# Patient Record
Sex: Female | Born: 1935 | Race: Black or African American | Hispanic: No | Smoking: Never smoker
Health system: Southern US, Community
[De-identification: ages and names within clinical notes are randomized; demographics above are authoritative.]

## PROBLEM LIST (undated history)

## (undated) DIAGNOSIS — J069 Acute upper respiratory infection, unspecified: Secondary | ICD-10-CM

## (undated) DIAGNOSIS — M25569 Pain in unspecified knee: Secondary | ICD-10-CM

## (undated) DIAGNOSIS — M810 Age-related osteoporosis without current pathological fracture: Secondary | ICD-10-CM

## (undated) DIAGNOSIS — Z86010 Personal history of colonic polyps: Secondary | ICD-10-CM

## (undated) DIAGNOSIS — M549 Dorsalgia, unspecified: Secondary | ICD-10-CM

## (undated) DIAGNOSIS — I10 Essential (primary) hypertension: Secondary | ICD-10-CM

## (undated) DIAGNOSIS — M5412 Radiculopathy, cervical region: Secondary | ICD-10-CM

## (undated) DIAGNOSIS — IMO0002 Reserved for concepts with insufficient information to code with codable children: Secondary | ICD-10-CM

## (undated) DIAGNOSIS — E785 Hyperlipidemia, unspecified: Secondary | ICD-10-CM

## (undated) DIAGNOSIS — Z87442 Personal history of urinary calculi: Secondary | ICD-10-CM

## (undated) DIAGNOSIS — M1990 Unspecified osteoarthritis, unspecified site: Secondary | ICD-10-CM

## (undated) DIAGNOSIS — M4800 Spinal stenosis, site unspecified: Secondary | ICD-10-CM

## (undated) DIAGNOSIS — M899 Disorder of bone, unspecified: Secondary | ICD-10-CM

## (undated) DIAGNOSIS — I639 Cerebral infarction, unspecified: Secondary | ICD-10-CM

## (undated) DIAGNOSIS — H6120 Impacted cerumen, unspecified ear: Secondary | ICD-10-CM

## (undated) DIAGNOSIS — F0390 Unspecified dementia without behavioral disturbance: Secondary | ICD-10-CM

## (undated) DIAGNOSIS — M1710 Unilateral primary osteoarthritis, unspecified knee: Secondary | ICD-10-CM

## (undated) DIAGNOSIS — M7989 Other specified soft tissue disorders: Secondary | ICD-10-CM

## (undated) DIAGNOSIS — E7800 Pure hypercholesterolemia, unspecified: Secondary | ICD-10-CM

## (undated) DIAGNOSIS — M79609 Pain in unspecified limb: Secondary | ICD-10-CM

## (undated) DIAGNOSIS — C4920 Malignant neoplasm of connective and soft tissue of unspecified lower limb, including hip: Secondary | ICD-10-CM

## (undated) DIAGNOSIS — F329 Major depressive disorder, single episode, unspecified: Secondary | ICD-10-CM

## (undated) DIAGNOSIS — M25579 Pain in unspecified ankle and joints of unspecified foot: Secondary | ICD-10-CM

## (undated) DIAGNOSIS — D509 Iron deficiency anemia, unspecified: Secondary | ICD-10-CM

## (undated) DIAGNOSIS — L259 Unspecified contact dermatitis, unspecified cause: Secondary | ICD-10-CM

## (undated) DIAGNOSIS — M949 Disorder of cartilage, unspecified: Secondary | ICD-10-CM

## (undated) DIAGNOSIS — K5909 Other constipation: Secondary | ICD-10-CM

## (undated) DIAGNOSIS — G43009 Migraine without aura, not intractable, without status migrainosus: Secondary | ICD-10-CM

## (undated) HISTORY — DX: Other specified soft tissue disorders: M79.89

## (undated) HISTORY — DX: Pain in unspecified knee: M25.569

## (undated) HISTORY — PX: ABDOMINAL HYSTERECTOMY: SHX81

## (undated) HISTORY — DX: Dorsalgia, unspecified: M54.9

## (undated) HISTORY — DX: Other constipation: K59.09

## (undated) HISTORY — DX: Essential (primary) hypertension: I10

## (undated) HISTORY — DX: Disorder of bone, unspecified: M89.9

## (undated) HISTORY — DX: Major depressive disorder, single episode, unspecified: F32.9

## (undated) HISTORY — DX: Spinal stenosis, site unspecified: M48.00

## (undated) HISTORY — DX: Cerebral infarction, unspecified: I63.9

## (undated) HISTORY — DX: Unspecified contact dermatitis, unspecified cause: L25.9

## (undated) HISTORY — PX: CHOLECYSTECTOMY: SHX55

## (undated) HISTORY — DX: Impacted cerumen, unspecified ear: H61.20

## (undated) HISTORY — DX: Pure hypercholesterolemia, unspecified: E78.00

## (undated) HISTORY — DX: Personal history of colonic polyps: Z86.010

## (undated) HISTORY — DX: Migraine without aura, not intractable, without status migrainosus: G43.009

## (undated) HISTORY — DX: Acute upper respiratory infection, unspecified: J06.9

## (undated) HISTORY — PX: TUBAL LIGATION: SHX77

## (undated) HISTORY — DX: Disorder of cartilage, unspecified: M94.9

## (undated) HISTORY — DX: Age-related osteoporosis without current pathological fracture: M81.0

## (undated) HISTORY — DX: Malignant neoplasm of connective and soft tissue of unspecified lower limb, including hip: C49.20

## (undated) HISTORY — DX: Hyperlipidemia, unspecified: E78.5

## (undated) HISTORY — DX: Pain in unspecified ankle and joints of unspecified foot: M25.579

## (undated) HISTORY — DX: Reserved for concepts with insufficient information to code with codable children: IMO0002

## (undated) HISTORY — DX: Radiculopathy, cervical region: M54.12

## (undated) HISTORY — DX: Iron deficiency anemia, unspecified: D50.9

## (undated) HISTORY — DX: Unilateral primary osteoarthritis, unspecified knee: M17.10

## (undated) HISTORY — DX: Unspecified osteoarthritis, unspecified site: M19.90

## (undated) HISTORY — DX: Pain in unspecified limb: M79.609

## (undated) HISTORY — DX: Personal history of urinary calculi: Z87.442

---

## 1999-04-26 ENCOUNTER — Encounter: Admission: RE | Admit: 1999-04-26 | Discharge: 1999-04-26 | Payer: Self-pay | Admitting: Internal Medicine

## 1999-04-26 ENCOUNTER — Encounter: Payer: Self-pay | Admitting: Internal Medicine

## 2000-05-30 ENCOUNTER — Encounter: Payer: Self-pay | Admitting: Internal Medicine

## 2000-05-30 ENCOUNTER — Encounter: Admission: RE | Admit: 2000-05-30 | Discharge: 2000-05-30 | Payer: Self-pay | Admitting: Internal Medicine

## 2001-09-25 LAB — HM COLONOSCOPY

## 2003-06-17 ENCOUNTER — Ambulatory Visit (HOSPITAL_COMMUNITY): Admission: RE | Admit: 2003-06-17 | Discharge: 2003-06-17 | Payer: Self-pay | Admitting: Obstetrics

## 2004-03-30 ENCOUNTER — Ambulatory Visit: Payer: Self-pay | Admitting: Internal Medicine

## 2004-04-12 ENCOUNTER — Ambulatory Visit: Payer: Self-pay | Admitting: Internal Medicine

## 2004-04-13 ENCOUNTER — Ambulatory Visit: Payer: Self-pay | Admitting: Gastroenterology

## 2004-05-19 ENCOUNTER — Ambulatory Visit: Payer: Self-pay | Admitting: Gastroenterology

## 2005-01-05 ENCOUNTER — Ambulatory Visit: Payer: Self-pay | Admitting: Internal Medicine

## 2005-01-25 ENCOUNTER — Ambulatory Visit: Payer: Self-pay | Admitting: Internal Medicine

## 2005-03-31 ENCOUNTER — Ambulatory Visit: Payer: Self-pay | Admitting: Internal Medicine

## 2005-06-30 ENCOUNTER — Ambulatory Visit (HOSPITAL_COMMUNITY): Admission: RE | Admit: 2005-06-30 | Discharge: 2005-06-30 | Payer: Self-pay | Admitting: Internal Medicine

## 2005-08-04 ENCOUNTER — Ambulatory Visit: Payer: Self-pay | Admitting: Internal Medicine

## 2006-01-20 ENCOUNTER — Ambulatory Visit: Payer: Self-pay | Admitting: Internal Medicine

## 2006-02-20 ENCOUNTER — Ambulatory Visit: Payer: Self-pay | Admitting: Internal Medicine

## 2006-03-06 ENCOUNTER — Ambulatory Visit: Payer: Self-pay | Admitting: Internal Medicine

## 2006-03-06 LAB — CONVERTED CEMR LAB
ALT: 22 units/L (ref 0–40)
Cholesterol: 231 mg/dL (ref 0–200)
GFR calc non Af Amer: 57 mL/min
Glucose, Bld: 88 mg/dL (ref 70–99)
HDL: 86.1 mg/dL (ref >39.0)
Potassium: 4.5 meq/L (ref 3.5–5.1)
Sodium: 144 meq/L (ref 135–145)
TSH: 1.9 microintl units/mL (ref 0.35–5.50)
Total CHOL/HDL Ratio: 2.7
Triglycerides: 74 mg/dL (ref 0–149)
VLDL: 15 mg/dL (ref 0–40)

## 2006-04-10 ENCOUNTER — Ambulatory Visit: Payer: Self-pay | Admitting: Internal Medicine

## 2006-04-29 ENCOUNTER — Inpatient Hospital Stay (HOSPITAL_COMMUNITY): Admission: AD | Admit: 2006-04-29 | Discharge: 2006-05-02 | Payer: Self-pay | Admitting: Internal Medicine

## 2006-04-29 ENCOUNTER — Ambulatory Visit: Payer: Self-pay | Admitting: Family Medicine

## 2006-04-30 ENCOUNTER — Ambulatory Visit: Payer: Self-pay | Admitting: Internal Medicine

## 2006-05-01 ENCOUNTER — Ambulatory Visit: Payer: Self-pay | Admitting: Physical Medicine & Rehabilitation

## 2006-05-08 ENCOUNTER — Ambulatory Visit: Payer: Self-pay | Admitting: Internal Medicine

## 2006-05-16 ENCOUNTER — Ambulatory Visit: Payer: Self-pay | Admitting: Internal Medicine

## 2006-05-22 ENCOUNTER — Ambulatory Visit: Payer: Self-pay | Admitting: Internal Medicine

## 2006-05-29 ENCOUNTER — Ambulatory Visit: Payer: Self-pay | Admitting: Internal Medicine

## 2006-05-29 LAB — CONVERTED CEMR LAB
ALT: 19 units/L (ref 0–40)
Alkaline Phosphatase: 63 units/L (ref 39–117)
BUN: 11 mg/dL (ref 6–23)
Bilirubin, Direct: 0.2 mg/dL (ref 0.0–0.3)
CO2: 29 meq/L (ref 19–32)
Calcium: 9.6 mg/dL (ref 8.4–10.5)
GFR calc Af Amer: 69 mL/min
GFR calc non Af Amer: 57 mL/min
Glucose, Bld: 83 mg/dL (ref 70–99)
LDL Cholesterol: 81 mg/dL (ref 0–99)
Potassium: 3.6 meq/L (ref 3.5–5.1)
Total CHOL/HDL Ratio: 2
Total Protein: 6.8 g/dL (ref 6.0–8.3)
Triglycerides: 51 mg/dL (ref 0–149)

## 2006-06-16 ENCOUNTER — Ambulatory Visit: Payer: Self-pay | Admitting: Internal Medicine

## 2006-06-21 ENCOUNTER — Ambulatory Visit: Payer: Self-pay | Admitting: Internal Medicine

## 2006-07-11 ENCOUNTER — Ambulatory Visit: Payer: Self-pay | Admitting: Internal Medicine

## 2006-09-21 ENCOUNTER — Encounter: Payer: Self-pay | Admitting: *Deleted

## 2006-09-21 DIAGNOSIS — K5909 Other constipation: Secondary | ICD-10-CM

## 2006-09-21 DIAGNOSIS — Z860100 Personal history of colon polyps, unspecified: Secondary | ICD-10-CM

## 2006-09-21 DIAGNOSIS — I10 Essential (primary) hypertension: Secondary | ICD-10-CM

## 2006-09-21 DIAGNOSIS — Z87442 Personal history of urinary calculi: Secondary | ICD-10-CM

## 2006-09-21 DIAGNOSIS — G43009 Migraine without aura, not intractable, without status migrainosus: Secondary | ICD-10-CM | POA: Insufficient documentation

## 2006-09-21 HISTORY — DX: Migraine without aura, not intractable, without status migrainosus: G43.009

## 2006-09-21 HISTORY — DX: Personal history of colon polyps, unspecified: Z86.0100

## 2006-09-21 HISTORY — DX: Essential (primary) hypertension: I10

## 2006-09-21 HISTORY — DX: Personal history of urinary calculi: Z87.442

## 2006-09-21 HISTORY — DX: Personal history of colonic polyps: Z86.010

## 2006-09-21 HISTORY — DX: Other constipation: K59.09

## 2006-10-05 ENCOUNTER — Ambulatory Visit: Payer: Self-pay | Admitting: Internal Medicine

## 2006-11-20 ENCOUNTER — Telehealth (INDEPENDENT_AMBULATORY_CARE_PROVIDER_SITE_OTHER): Payer: Self-pay | Admitting: *Deleted

## 2006-12-21 ENCOUNTER — Encounter: Payer: Self-pay | Admitting: Internal Medicine

## 2007-01-01 ENCOUNTER — Encounter (INDEPENDENT_AMBULATORY_CARE_PROVIDER_SITE_OTHER): Payer: Self-pay | Admitting: *Deleted

## 2007-01-01 ENCOUNTER — Ambulatory Visit: Payer: Self-pay | Admitting: Internal Medicine

## 2007-01-01 LAB — CONVERTED CEMR LAB
CO2: 29 meq/L (ref 19–32)
Creatinine, Ser: 1.1 mg/dL (ref 0.4–1.2)
GFR calc Af Amer: 62 mL/min
Glucose, Bld: 85 mg/dL (ref 70–99)
HDL: 94.3 mg/dL (ref >39.0)
Potassium: 3.9 meq/L (ref 3.5–5.1)
Sodium: 141 meq/L (ref 135–145)
Total CHOL/HDL Ratio: 1.8
Triglycerides: 42 mg/dL (ref 0–149)

## 2007-01-17 ENCOUNTER — Encounter: Payer: Self-pay | Admitting: Internal Medicine

## 2007-01-22 ENCOUNTER — Ambulatory Visit: Payer: Self-pay | Admitting: Internal Medicine

## 2007-01-22 DIAGNOSIS — M25579 Pain in unspecified ankle and joints of unspecified foot: Secondary | ICD-10-CM

## 2007-01-22 DIAGNOSIS — M810 Age-related osteoporosis without current pathological fracture: Secondary | ICD-10-CM | POA: Insufficient documentation

## 2007-01-22 HISTORY — DX: Age-related osteoporosis without current pathological fracture: M81.0

## 2007-01-22 HISTORY — DX: Pain in unspecified ankle and joints of unspecified foot: M25.579

## 2007-04-20 ENCOUNTER — Ambulatory Visit: Payer: Self-pay | Admitting: Internal Medicine

## 2007-04-20 DIAGNOSIS — J069 Acute upper respiratory infection, unspecified: Secondary | ICD-10-CM | POA: Insufficient documentation

## 2007-04-20 DIAGNOSIS — H6120 Impacted cerumen, unspecified ear: Secondary | ICD-10-CM

## 2007-04-20 HISTORY — DX: Acute upper respiratory infection, unspecified: J06.9

## 2007-04-20 HISTORY — DX: Impacted cerumen, unspecified ear: H61.20

## 2007-05-21 ENCOUNTER — Encounter: Payer: Self-pay | Admitting: Internal Medicine

## 2007-07-03 ENCOUNTER — Encounter: Payer: Self-pay | Admitting: Internal Medicine

## 2007-07-16 ENCOUNTER — Ambulatory Visit: Payer: Self-pay | Admitting: Internal Medicine

## 2007-07-16 LAB — CONVERTED CEMR LAB
AST: 26 units/L (ref 0–37)
Albumin: 3.8 g/dL (ref 3.5–5.2)
BUN: 15 mg/dL (ref 6–23)
Calcium: 9.6 mg/dL (ref 8.4–10.5)
Chloride: 107 meq/L (ref 96–112)
Cholesterol: 191 mg/dL (ref 0–200)
Creatinine, Ser: 1 mg/dL (ref 0.4–1.2)
GFR calc Af Amer: 69 mL/min
GFR calc non Af Amer: 57 mL/min
HDL: 90.4 mg/dL (ref >39.0)
LDL Cholesterol: 92 mg/dL (ref 0–99)
Total Bilirubin: 0.7 mg/dL (ref 0.3–1.2)
Total CHOL/HDL Ratio: 2.1
Triglycerides: 44 mg/dL (ref 0–149)
VLDL: 9 mg/dL (ref 0–40)

## 2007-07-22 ENCOUNTER — Encounter: Payer: Self-pay | Admitting: Internal Medicine

## 2007-08-01 ENCOUNTER — Ambulatory Visit: Payer: Self-pay | Admitting: Internal Medicine

## 2007-08-01 DIAGNOSIS — E785 Hyperlipidemia, unspecified: Secondary | ICD-10-CM

## 2007-08-01 DIAGNOSIS — D509 Iron deficiency anemia, unspecified: Secondary | ICD-10-CM

## 2007-08-01 DIAGNOSIS — F411 Generalized anxiety disorder: Secondary | ICD-10-CM | POA: Insufficient documentation

## 2007-08-01 DIAGNOSIS — F329 Major depressive disorder, single episode, unspecified: Secondary | ICD-10-CM

## 2007-08-01 DIAGNOSIS — F3289 Other specified depressive episodes: Secondary | ICD-10-CM

## 2007-08-01 HISTORY — DX: Iron deficiency anemia, unspecified: D50.9

## 2007-08-01 HISTORY — DX: Hyperlipidemia, unspecified: E78.5

## 2007-08-01 HISTORY — DX: Other specified depressive episodes: F32.89

## 2007-08-01 HISTORY — DX: Major depressive disorder, single episode, unspecified: F32.9

## 2007-09-04 ENCOUNTER — Telehealth: Payer: Self-pay | Admitting: Internal Medicine

## 2007-09-19 ENCOUNTER — Ambulatory Visit (HOSPITAL_COMMUNITY): Admission: RE | Admit: 2007-09-19 | Discharge: 2007-09-19 | Payer: Self-pay | Admitting: Internal Medicine

## 2007-09-27 ENCOUNTER — Encounter: Payer: Self-pay | Admitting: Internal Medicine

## 2007-10-04 ENCOUNTER — Encounter: Admission: RE | Admit: 2007-10-04 | Discharge: 2007-10-04 | Payer: Self-pay | Admitting: Internal Medicine

## 2008-03-03 ENCOUNTER — Ambulatory Visit: Payer: Self-pay | Admitting: Internal Medicine

## 2008-03-03 DIAGNOSIS — M79609 Pain in unspecified limb: Secondary | ICD-10-CM | POA: Insufficient documentation

## 2008-03-03 HISTORY — DX: Pain in unspecified limb: M79.609

## 2008-03-11 ENCOUNTER — Encounter: Payer: Self-pay | Admitting: Internal Medicine

## 2008-03-11 ENCOUNTER — Ambulatory Visit: Payer: Self-pay

## 2008-07-28 ENCOUNTER — Ambulatory Visit: Payer: Self-pay | Admitting: Internal Medicine

## 2008-08-01 ENCOUNTER — Ambulatory Visit: Payer: Self-pay | Admitting: Internal Medicine

## 2008-08-01 DIAGNOSIS — L259 Unspecified contact dermatitis, unspecified cause: Secondary | ICD-10-CM

## 2008-08-01 HISTORY — DX: Unspecified contact dermatitis, unspecified cause: L25.9

## 2008-08-08 ENCOUNTER — Ambulatory Visit: Payer: Self-pay | Admitting: Internal Medicine

## 2008-08-08 LAB — CONVERTED CEMR LAB
Basophils Absolute: 0 10*3/uL (ref 0.0–0.1)
CO2: 31 meq/L (ref 19–32)
Calcium: 9.4 mg/dL (ref 8.4–10.5)
HCT: 34.6 % — ABNORMAL LOW (ref 36.0–46.0)
HDL: 96 mg/dL (ref >39.00)
Leukocytes, UA: NEGATIVE
Lymphs Abs: 2.9 10*3/uL (ref 0.7–4.0)
MCV: 93.2 fL (ref 78.0–100.0)
Monocytes Absolute: 0.6 10*3/uL (ref 0.1–1.0)
Monocytes Relative: 7.1 % (ref 3.0–12.0)
Neutrophils Relative %: 55 % (ref 43.0–77.0)
Nitrite: NEGATIVE
Platelets: 196 10*3/uL (ref 150.0–400.0)
Potassium: 4.1 meq/L (ref 3.5–5.1)
RDW: 13.2 % (ref 11.5–14.6)
Sodium: 143 meq/L (ref 135–145)
Specific Gravity, Urine: 1.025 (ref 1.000–1.030)
TSH: 2.88 microintl units/mL (ref 0.35–5.50)
Total Bilirubin: 0.6 mg/dL (ref 0.3–1.2)
Total CHOL/HDL Ratio: 2
Triglycerides: 42 mg/dL (ref 0.0–149.0)
VLDL: 8.4 mg/dL (ref 0.0–40.0)
pH: 5 (ref 5.0–8.0)

## 2008-08-09 LAB — CONVERTED CEMR LAB
Iron: 61 ug/dL (ref 42–145)
Transferrin: 248.6 mg/dL (ref 212.0–360.0)
Vitamin B-12: 324 pg/mL (ref 211–911)

## 2008-09-15 ENCOUNTER — Ambulatory Visit: Payer: Self-pay | Admitting: Internal Medicine

## 2008-09-15 DIAGNOSIS — M25569 Pain in unspecified knee: Secondary | ICD-10-CM

## 2008-09-15 HISTORY — DX: Pain in unspecified knee: M25.569

## 2008-12-17 ENCOUNTER — Ambulatory Visit: Payer: Self-pay | Admitting: Internal Medicine

## 2008-12-17 DIAGNOSIS — M949 Disorder of cartilage, unspecified: Secondary | ICD-10-CM

## 2008-12-17 DIAGNOSIS — M4800 Spinal stenosis, site unspecified: Secondary | ICD-10-CM

## 2008-12-17 DIAGNOSIS — M899 Disorder of bone, unspecified: Secondary | ICD-10-CM | POA: Insufficient documentation

## 2008-12-17 HISTORY — DX: Spinal stenosis, site unspecified: M48.00

## 2008-12-17 HISTORY — DX: Disorder of bone, unspecified: M89.9

## 2009-02-11 ENCOUNTER — Encounter: Payer: Self-pay | Admitting: Internal Medicine

## 2009-02-11 ENCOUNTER — Telehealth: Payer: Self-pay | Admitting: Internal Medicine

## 2009-03-11 ENCOUNTER — Telehealth: Payer: Self-pay | Admitting: Internal Medicine

## 2009-03-26 ENCOUNTER — Ambulatory Visit: Payer: Self-pay | Admitting: Internal Medicine

## 2009-05-12 ENCOUNTER — Telehealth: Payer: Self-pay | Admitting: Internal Medicine

## 2009-07-02 ENCOUNTER — Telehealth: Payer: Self-pay | Admitting: Internal Medicine

## 2009-08-28 ENCOUNTER — Telehealth (INDEPENDENT_AMBULATORY_CARE_PROVIDER_SITE_OTHER): Payer: Self-pay | Admitting: *Deleted

## 2009-09-14 ENCOUNTER — Ambulatory Visit: Payer: Self-pay | Admitting: Internal Medicine

## 2009-09-14 LAB — CONVERTED CEMR LAB
ALT: 13 units/L (ref 0–35)
BUN: 14 mg/dL (ref 6–23)
Bilirubin Urine: NEGATIVE
Bilirubin, Direct: 0.1 mg/dL (ref 0.0–0.3)
Chloride: 106 meq/L (ref 96–112)
Creatinine, Ser: 1.1 mg/dL (ref 0.4–1.2)
Eosinophils Relative: 1.4 % (ref 0.0–5.0)
GFR calc non Af Amer: 64.55 mL/min (ref >60)
HDL: 88.7 mg/dL (ref >39.00)
Hemoglobin, Urine: NEGATIVE
Ketones, ur: NEGATIVE mg/dL
LDL Cholesterol: 86 mg/dL (ref 0–99)
MCV: 92.3 fL (ref 78.0–100.0)
Monocytes Absolute: 0.3 10*3/uL (ref 0.1–1.0)
Neutrophils Relative %: 56 % (ref 43.0–77.0)
Platelets: 209 10*3/uL (ref 150.0–400.0)
Total Bilirubin: 0.5 mg/dL (ref 0.3–1.2)
Total CHOL/HDL Ratio: 2
Triglycerides: 51 mg/dL (ref 0.0–149.0)
Urine Glucose: NEGATIVE mg/dL
Urobilinogen, UA: 0.2 (ref 0.0–1.0)
VLDL: 10.2 mg/dL (ref 0.0–40.0)
WBC: 4 10*3/uL — ABNORMAL LOW (ref 4.5–10.5)

## 2009-10-08 ENCOUNTER — Ambulatory Visit (HOSPITAL_COMMUNITY): Admission: RE | Admit: 2009-10-08 | Discharge: 2009-10-08 | Payer: Self-pay | Admitting: Internal Medicine

## 2009-10-08 LAB — HM MAMMOGRAPHY: HM Mammogram: NEGATIVE

## 2010-02-07 ENCOUNTER — Encounter: Payer: Self-pay | Admitting: Internal Medicine

## 2010-02-08 ENCOUNTER — Encounter: Payer: Self-pay | Admitting: Internal Medicine

## 2010-02-10 ENCOUNTER — Ambulatory Visit
Admission: RE | Admit: 2010-02-10 | Discharge: 2010-02-10 | Payer: Self-pay | Source: Home / Self Care | Attending: Internal Medicine | Admitting: Internal Medicine

## 2010-02-10 DIAGNOSIS — M549 Dorsalgia, unspecified: Secondary | ICD-10-CM | POA: Insufficient documentation

## 2010-02-10 HISTORY — DX: Dorsalgia, unspecified: M54.9

## 2010-02-18 NOTE — Progress Notes (Signed)
Summary: [facility]  Phone Note Call from Patient Call back at Home Phone [phone]   Caller: Patient Summary of Call: pt called stating that her new Insurance co will no longer cover her RX for Benicar. pt is requesting alt to go to [facility] along with all of her other medications. Initial call taken by: Margaret Pyle, CMA,  March 11, 2009 8:44 AM  Follow-up for Phone Call        ok to change to diovan 320 mg -   to robin to handle change and other rx Follow-up by: [name] MD,  March 11, 2009 12:45 PM    New/Updated Medications: DIOVAN 320 MG TABS (VALSARTAN) 1 by mouth once daily Prescriptions: DIOVAN 320 MG TABS (VALSARTAN) 1 by mouth once daily  #90 x 1   Entered by:   Scharlene Gloss   Authorized by:   [name] MD   Signed by:   Scharlene Gloss on 03/11/2009   Method used:   Faxed to ...       [facility] (mail-order)             ,          Ph: [phone]       Fax: [phone]   RxID:   [id number] BENICAR 40 MG  TABS (OLMESARTAN MEDOXOMIL) Take 1 tablet by mouth once a day  #90 x 1   Entered by:   Scharlene Gloss   Authorized by:   [name] MD   Signed by:   Scharlene Gloss on 03/11/2009   Method used:   Faxed to ...       [facility]* (mail-order)             ,          Ph: [phone]       Fax: [phone]   RxID:   [id number] DIOVAN 320 MG TABS (VALSARTAN) 1 by mouth once daily  #90 x 1   Entered by:   Scharlene Gloss   Authorized by:   [name] MD   Signed by:   Scharlene Gloss on 03/11/2009   Method used:   Faxed to ...       [facility]* (mail-order)             ,          Ph: [phone]       Fax: [phone]   RxID:   [id number]

## 2010-02-18 NOTE — Assessment & Plan Note (Signed)
Summary: FU---STC   Vital Signs:  Patient profile:   76 year old female Height:      64 inches Weight:      130.50 pounds BMI:     22.48 O2 Sat:      96 % on Room air Temp:     97.9 degrees F oral Pulse rate:   74 / minute BP sitting:   120 / 68  (left arm) Cuff size:   regular  Vitals Entered By: Zella Ball Ewing CMA Duncan Dull) (February 10, 2010 9:49 AM)  O2 Flow:  Room air CC: followup/RE   CC:  followup/RE.  History of Present Illness: here for f/u;  overall doing ok, but with ongoing mild LBP with some radiation to the right buttock area, intermittent, nothing makes worse or better but prefers to cont to try tylenol as needed,  without worseing bowel or bladder change, more distal LE pain/weak/numb, gait change or fall.  Pt denies CP, worsening sob, doe, wheezing, orthopnea, pnd, worsening LE edema, palps, dizziness or syncope  Pt denies new neuro symptoms such as headache, facial or extremity weakness  Pt denies polydipsia, polyuria  Overall good compliance with meds, trying to follow low chol diet, wt stable, little excercise however No fever, wt loss, night sweats, loss of appetite or other constitutional symptoms  No recent trauma.  Denies worsening depressive symptoms, suicidal ideation, or panic.  , though has some midl ongoing anxeity.  Overall good compliance with meds, and good tolerability.   Problems Prior to Update: 1)  Back Pain, Chronic, Intermittent  (ICD-724.5) 2)  Osteopenia  (ICD-733.90) 3)  Spinal Stenosis  (ICD-724.00) 4)  Knee Pain, Bilateral  (ICD-719.46) 5)  Contact Dermatitis  (ICD-692.9) 6)  Leg Pain, Right  (ICD-729.5) 7)  Anemia-iron Deficiency  (ICD-280.9) 8)  Depression  (ICD-311) 9)  Hyperlipidemia  (ICD-272.4) 10)  Anxiety  (ICD-300.00) 11)  Preventive Health Care  (ICD-V70.0) 12)  Cerumen Impaction, Right  (ICD-380.4) 13)  Uri  (ICD-465.9) 14)  Osteoporosis  (ICD-733.00) 15)  Ankle Pain, Left  (ICD-719.47) 16)  Constipation, Chronic   (ICD-564.09) 17)  Nephrolithiasis, Hx of  (ICD-V13.01) 18)  Common Migraine  (ICD-346.10) 19)  Colonic Polyps, Hx of  (ICD-V12.72) 20)  Hypertension  (ICD-401.9)  Medications Prior to Update: 1)  Boniva 150 Mg  Tabs (Ibandronate Sodium) .... One By Mouth Once A Month 2)  Aspirin 325 Mg  Tabs (Aspirin) .... Take 1 Tablet By Mouth Once A Day 3)  Amlodipine Besylate 10 Mg  Tabs (Amlodipine Besylate) .... Take 1 Tablet By Mouth Once A Day 4)  Calcium-Vitamin D 500-125 Mg-Unit  Tabs (Calcium-Vitamin D) .... Take 1 Tablet By Mouth Once A Day 5)  Miralax   Powd (Polyethylene Glycol 3350) .Marland Kitchen.. 17grams Two Times A Day 6)  Simvastatin 20 Mg  Tabs (Simvastatin) .... Take 1 Tablet By Mouth Once A Day 7)  Diovan 320 Mg Tabs (Valsartan) .Marland Kitchen.. 1 By Mouth Once Daily  Current Medications (verified): 1)  Boniva 150 Mg  Tabs (Ibandronate Sodium) .... One By Mouth Once A Month 2)  Aspirin 325 Mg  Tabs (Aspirin) .... Take 1 Tablet By Mouth Once A Day 3)  Amlodipine Besylate 10 Mg  Tabs (Amlodipine Besylate) .... Take 1 Tablet By Mouth Once A Day 4)  Calcium-Vitamin D 500-125 Mg-Unit  Tabs (Calcium-Vitamin D) .... Take 1 Tablet By Mouth Once A Day 5)  Miralax   Powd (Polyethylene Glycol 3350) .Marland Kitchen.. 17grams Two Times A Day 6)  Simvastatin  20 Mg  Tabs (Simvastatin) .... Take 1 Tablet By Mouth Once A Day 7)  Diovan 320 Mg Tabs (Valsartan) .Marland Kitchen.. 1 By Mouth Once Daily 8)  Zovirax 5 % Crea (Acyclovir) .... Use Asd 5 Times Per Day As Needed  Allergies (verified): 1)  ! Fosamax 2)  ! Maxzide  Past History:  Past Medical History: Last updated: 12/17/2008 Hypertension Colonic polyps, hx of - adenomatous Osteoporosis Cerebrovascular accident with right-leg weakness. 04/2006 Spinal stenosis Anxiety Hyperlipidemia chronic constipation Nephrolithiasis, hx of Depression Anemia-iron deficiency Osteopenia  Past Surgical History: Last updated: 08/01/2007 Cholecystectomy Hysterectomy Tubal ligation  Social  History: Last updated: 09/14/2009 Widowed Duaghter lives with her, has mild handcap- chronic mental Retired from housekeeping and cooking Never Smoked Alcohol use-no Drug use-no  Risk Factors: Smoking Status: never (04/20/2007)  Review of Systems       all otherwise negative per pt -    Physical Exam  General:  alert and underweight appearing.   Head:  normocephalic and atraumatic.   Eyes:  vision grossly intact, pupils equal, and pupils round.   Ears:  R ear normal and L ear normal.   Nose:  no external deformity and no nasal discharge.   Mouth:  no gingival abnormalities and pharynx pink and moist.   Neck:  supple and no masses.   Lungs:  normal respiratory effort and normal breath sounds.   Heart:  normal rate and regular rhythm.   Abdomen:  soft, non-tender, and normal bowel sounds.   Msk:  no joint tenderness and no joint swelling.  , no spinal tender or paravertebral tender Extremities:  no edema, no erythema  Neurologic:  cranial nerves II-XII intact and strength normal in all extremities.   Skin:  color normal and no rashes.   Psych:  not depressed appearing and slightly anxious.     Impression & Recommendations:  Problem # 1:  BACK PAIN, CHRONIC, INTERMITTENT (ICD-724.5)  Her updated medication list for this problem includes:    Aspirin 325 Mg Tabs (Aspirin) .Marland Kitchen... Take 1 tablet by mouth once a day prob spinal stenosis flare with pain radiating towards the right knee, pt de lcines pain med such as tramadol or ortho at this time, did do better with ESI in the past  Problem # 2:  HYPERTENSION (ICD-401.9)  Her updated medication list for this problem includes:    Amlodipine Besylate 10 Mg Tabs (Amlodipine besylate) .Marland Kitchen... Take 1 tablet by mouth once a day    Diovan 320 Mg Tabs (Valsartan) .Marland Kitchen... 1 by mouth once daily  BP today: 120/68 Prior BP: 140/70 (09/14/2009)  Labs Reviewed: K+: 4.3 (09/14/2009) Creat: : 1.1 (09/14/2009)   Chol: 185 (09/14/2009)   HDL:  88.70 (09/14/2009)   LDL: 86 (09/14/2009)   TG: 51.0 (09/14/2009) stable overall by hx and exam, ok to continue meds/tx as is   Problem # 3:  DEPRESSION (ICD-311) stable overall by hx and exam, ok to continue meds/tx as is - declines med such as SSRI  Complete Medication List: 1)  Boniva 150 Mg Tabs (Ibandronate sodium) .... One by mouth once a month 2)  Aspirin 325 Mg Tabs (Aspirin) .... Take 1 tablet by mouth once a day 3)  Amlodipine Besylate 10 Mg Tabs (Amlodipine besylate) .... Take 1 tablet by mouth once a day 4)  Calcium-vitamin D 500-125 Mg-unit Tabs (Calcium-vitamin d) .... Take 1 tablet by mouth once a day 5)  Miralax Powd (Polyethylene glycol 3350) .Marland Kitchen.. 17grams two times a day 6)  Simvastatin 20 Mg Tabs (Simvastatin) .... Take 1 tablet by mouth once a day 7)  Diovan 320 Mg Tabs (Valsartan) .Marland Kitchen.. 1 by mouth once daily 8)  Zovirax 5 % Crea (Acyclovir) .... Use asd 5 times per day as needed  Patient Instructions: 1)  Continue all previous medications as before this visit 2)  Please schedule a follow-up appointment in 7 months for CPX with labs Prescriptions: DIOVAN 320 MG TABS (VALSARTAN) 1 by mouth once daily  #90 x 3   Entered and Authorized by:   [name] MD   Signed by:   [name] MD on 02/10/2010   Method used:   Print then Give to Patient   RxID:   [id number] SIMVASTATIN 20 MG  TABS (SIMVASTATIN) Take 1 tablet by mouth once a day  #90 x 3   Entered and Authorized by:   [name] MD   Signed by:   [name] MD on 02/10/2010   Method used:   Print then Give to Patient   RxID:   [id number] AMLODIPINE BESYLATE 10 MG  TABS (AMLODIPINE BESYLATE) Take 1 tablet by mouth once a day  #90 x 3   Entered and Authorized by:   [name] MD   Signed by:   [name] MD on 02/10/2010   Method used:   Print then Give to Patient   RxID:   [id number] BONIVA 150 MG  TABS (IBANDRONATE SODIUM) one by mouth once a month  #3 x 3   Entered and Authorized  by:   [name] MD   Signed by:   [name] MD on 02/10/2010   Method used:   Print then Give to Patient   RxID:   [id number] AMLODIPINE BESYLATE 10 MG  TABS (AMLODIPINE BESYLATE) Take 1 tablet by mouth once a day  #90 x 2   Entered and Authorized by:   [name] MD   Signed by:   [name] MD on 02/10/2010   Method used:   Electronically to        [facility]* (retail)       [address]       [place], [place]  [id number]       Ph: [phone] or [phone]       Fax: [phone]   RxID:   [id number] ZOVIRAX 5 % CREA (ACYCLOVIR) use asd 5 times per day as needed  #1large x 1   Entered and Authorized by:   [name] MD   Signed by:   [name] MD on 02/10/2010   Method used:   Electronically to        [facility]* (retail)       [address]       [place], [place]  [address]       Ph: [phone] or [phone]       Fax: [phone]   RxID:   [id number]    Orders Added: 1)  Est. Patient Level IV [27782]

## 2010-02-18 NOTE — Progress Notes (Signed)
  Phone Note Call from Patient Call back at Home Phone [phone]   Caller: Patient Call For: Dr Jonny Ruiz Summary of Call: Pt sched appt for 8/22 to follow up w/MD. Pt wants labs before appt. She does not know what labs she needs but states she had a piece of paper that states she needs labs. Please advise and let pt know. Initial call taken by: Verdell Face,  August 28, 2009 1:54 PM  Follow-up for Phone Call        Pt was told at last OV to have CPX labs. Follow-up by: Margaret Pyle, CMA,  August 28, 2009 2:42 PM  Additional Follow-up for Phone Call Additional follow up Details #1::        orders in IDX. Additional Follow-up by: Verdell Face,  August 31, 2009 7:59 AM

## 2010-02-18 NOTE — Progress Notes (Signed)
Summary: Rx request  Phone Note Call from Patient Call back at Home Phone [phone]   Caller: Patient Call For: [name] MD Summary of Call: Patient came into the office with paperwork from [facility] stating they are not covering her Benicar anymore. She is either going to need a replacement, or we are going to need to call her insurance company at [phone] to request an exception.She would prefer to stay with this medication if possible. Initial call taken by: Irma Newness,  February 11, 2009 8:12 AM  Follow-up for Phone Call        VO:[id number]. Called [employer] and covered alternatives are Losartan (tier 1), and Micardis (tier 2). Should patient be switched to one of these meds or try to do a PA? Please advise. Follow-up by: Lucious Groves,  February 11, 2009 9:16 AM  Additional Follow-up for Phone Call Additional follow up Details #1::        gave "card" for money off on the benicar to offer patient for money off on the monthly copays   - card is good until 2016 Additional Follow-up by: [name] MD,  February 11, 2009 12:59 PM    Additional Follow-up for Phone Call Additional follow up Details #2::    Patient notified and she would like to pick the card up on Friday. Placed up front. Follow-up by: Lucious Groves,  February 11, 2009 3:08 PM

## 2010-02-18 NOTE — Medication Information (Signed)
Summary: Benicar denied/[employer] [place]  Benicar denied/[employer] [place]   Imported By: Lester [place] 02/13/2009 15:13:54  _____________________________________________________________________  External Attachment:    Type:   Image     Comment:   External Document

## 2010-02-18 NOTE — Progress Notes (Signed)
Summary: REFILLS TO [facility]  Phone Note Call from Patient Call back at Home Phone [phone]   Caller: [facility] Summary of Call: PT NEEDS REFILLS ON BONIVA 150 MG, DIOVAN 320, MIRALAX POWDER POLYETHYLENE GLYCOL 17 GRAMS, AMLODIPINE BESYLATE 10 MG, SIMVASTATIN 20 MG SENT TO [facility]. Initial call taken by: Hilarie Fredrickson,  July 02, 2009 8:59 AM    Prescriptions: DIOVAN 320 MG TABS (VALSARTAN) 1 by mouth once daily  #90 x 2   Entered by:   Margaret Pyle, CMA   Authorized by:   [name] MD   Signed by:   Margaret Pyle, CMA on 07/02/2009   Method used:   Faxed to ...       [facility] (mail-order)             , [place]         Ph: [phone]       Fax: [phone]   RxID:   [id number] SIMVASTATIN 20 MG  TABS (SIMVASTATIN) Take 1 tablet by mouth once a day  #90 x 2   Entered by:   Margaret Pyle, CMA   Authorized by:   [name] MD   Signed by:   Margaret Pyle, CMA on 07/02/2009   Method used:   Faxed to ...       [facility] (mail-order)             , [place]         Ph: [phone]       Fax: [phone]   RxID:   [id number] AMLODIPINE BESYLATE 10 MG  TABS (AMLODIPINE BESYLATE) Take 1 tablet by mouth once a day  #90 x 2   Entered by:   Margaret Pyle, CMA   Authorized by:   [name] MD   Signed by:   Margaret Pyle, CMA on 07/02/2009   Method used:   Faxed to ...       [facility] (mail-order)             , [place]         Ph: [phone]       Fax: [phone]   RxID:   [id number] MIRALAX   POWD (POLYETHYLENE GLYCOL 3350) 17grams two times a day  #33mth x 2   Entered by:   Margaret Pyle, CMA   Authorized by:   [name] MD   Signed by:   Margaret Pyle, CMA on 07/02/2009   Method used:   Faxed to ...       [facility] (mail-order)             , [place]         Ph: [phone]       Fax: [phone]   RxID:   [id number] BONIVA 150 MG  TABS (IBANDRONATE SODIUM) one by mouth once a month  #3 x 2  Entered by:   Margaret Pyle, CMA   Authorized by:   [name] MD   Signed by:   Margaret Pyle, CMA on 07/02/2009   Method used:   Faxed to ...       [facility] (mail-order)             , [place]         Ph: [phone]       Fax: [phone]   RxID:   [id number]

## 2010-02-18 NOTE — Assessment & Plan Note (Signed)
Summary: f/u appt/#/cd   PRESSED 1 TO CX/ LMOM TO CONFIRM/NWS   Vital Signs:  Patient profile:   77 year old female Height:      64 inches Weight:      128 pounds BMI:     22.05 O2 Sat:      97 % on Room air Temp:     97.1 degrees F oral Pulse rate:   83 / minute BP sitting:   140 / 70  (left arm) Cuff size:   regular  Vitals Entered By: Zella Ball Ewing CMA Duncan Dull) (September 14, 2009 8:23 AM)  O2 Flow:  Room air  Preventive Care Screening     decliens flus shot and colonoscopy, adn dxa;  does plan to get the mammogram later next month  CC: followup/RE   CC:  followup/RE.  History of Present Illness: overall doing well;  Pt denies CP, worsening sob, doe, wheezing, orthopnea, pnd, worsening LE edema, palps, dizziness or syncope  Pt denies new neuro symptoms such as headache, facial or extremity weakness  Still with ongoing recurring leg pains and back pain  - stable and Ok iwth current pain meds.  No wroseningLE pain, weakness or numbness. No falls or injury.  No fever, wt loss, night sweats, loss of appetite or other constitutional symptoms  Has had some mild constipatoin recently but this has beena recurring problems in the past, better with prune juice.  Has not driven for 20 yrs.   Preventive Screening-Counseling & Management      Drug Use:  no.    Problems Prior to Update: 1)  Osteopenia  (ICD-733.90) 2)  Spinal Stenosis  (ICD-724.00) 3)  Knee Pain, Bilateral  (ICD-719.46) 4)  Contact Dermatitis  (ICD-692.9) 5)  Leg Pain, Right  (ICD-729.5) 6)  Anemia-iron Deficiency  (ICD-280.9) 7)  Depression  (ICD-311) 8)  Hyperlipidemia  (ICD-272.4) 9)  Anxiety  (ICD-300.00) 10)  Preventive Health Care  (ICD-V70.0) 11)  Cerumen Impaction, Right  (ICD-380.4) 12)  Uri  (ICD-465.9) 13)  Osteoporosis  (ICD-733.00) 14)  Ankle Pain, Left  (ICD-719.47) 15)  Constipation, Chronic  (ICD-564.09) 16)  Nephrolithiasis, Hx of  (ICD-V13.01) 17)  Common Migraine  (ICD-346.10) 18)  Colonic  Polyps, Hx of  (ICD-V12.72) 19)  Hypertension  (ICD-401.9)  Medications Prior to Update: 1)  Boniva 150 Mg  Tabs (Ibandronate Sodium) .... One By Mouth Once A Month 2)  Aspirin 325 Mg  Tabs (Aspirin) .... Take 1 Tablet By Mouth Once A Day 3)  Amlodipine Besylate 10 Mg  Tabs (Amlodipine Besylate) .... Take 1 Tablet By Mouth Once A Day 4)  Calcium-Vitamin D 500-125 Mg-Unit  Tabs (Calcium-Vitamin D) .... Take 1 Tablet By Mouth Once A Day 5)  Miralax   Powd (Polyethylene Glycol 3350) .Marland Kitchen.. 17grams Two Times A Day 6)  Simvastatin 20 Mg  Tabs (Simvastatin) .... Take 1 Tablet By Mouth Once A Day 7)  Diovan 320 Mg Tabs (Valsartan) .Marland Kitchen.. 1 By Mouth Once Daily  Current Medications (verified): 1)  Boniva 150 Mg  Tabs (Ibandronate Sodium) .... One By Mouth Once A Month 2)  Aspirin 325 Mg  Tabs (Aspirin) .... Take 1 Tablet By Mouth Once A Day 3)  Amlodipine Besylate 10 Mg  Tabs (Amlodipine Besylate) .... Take 1 Tablet By Mouth Once A Day 4)  Calcium-Vitamin D 500-125 Mg-Unit  Tabs (Calcium-Vitamin D) .... Take 1 Tablet By Mouth Once A Day 5)  Miralax   Powd (Polyethylene Glycol 3350) .Marland Kitchen.. 17grams Two Times A Day  6)  Simvastatin 20 Mg  Tabs (Simvastatin) .... Take 1 Tablet By Mouth Once A Day 7)  Diovan 320 Mg Tabs (Valsartan) .Marland Kitchen.. 1 By Mouth Once Daily  Allergies (verified): 1)  ! Fosamax 2)  ! Maxzide  Past History:  Past Medical History: Last updated: 12/17/2008 Hypertension Colonic polyps, hx of - adenomatous Osteoporosis Cerebrovascular accident with right-leg weakness. 04/2006 Spinal stenosis Anxiety Hyperlipidemia chronic constipation Nephrolithiasis, hx of Depression Anemia-iron deficiency Osteopenia  Past Surgical History: Last updated: 2007/08/25 Cholecystectomy Hysterectomy Tubal ligation  Family History: Last updated: 08-25-2007 Mother deceased in her 87s secondary to uterine cancer. Father also in his 25s of a presumed MI versus stroke.  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         all otherwise negative per pt -    Physical Exam  General:  alert and underweight appearing.   Head:  normocephalic and atraumatic.   Eyes:  vision grossly intact, pupils equal, and pupils round.   Ears:  R ear normal and L ear normal.   Nose:  no external deformity and no nasal discharge.   Mouth:  no gingival abnormalities and pharynx pink and moist.   Neck:  supple and no masses.   Lungs:  normal respiratory effort and normal breath sounds.   Heart:  normal rate and regular rhythm.   Abdomen:  soft, non-tender, and normal bowel sounds.   Msk:  no joint tenderness and no joint swelling.   Extremities:  no edema, no erythema  Neurologic:  cranial nerves II-XII intact and strength normal in all extremities.   Skin:  color normal and no rashes.   Psych:  not anxious appearing and not depressed appearing.     Impression & Recommendations:  Problem # 1:  Preventive Health Care (ICD-V70.0)  Overall doing well, age appropriate education and  counseling updated and referral for appropriate preventive services done unless declined, immunizations up to date or declined, diet counseling done if overweight, urged to quit smoking if smokes , most recent labs reviewed and current ordered if appropriate, ecg reviewed or declined (interpretation per ECG scanned in the EMR if done); information regarding Medicare Prevention requirements given if appropriate; speciality referrals updated as appropriate   Orders: TLB-BMP (Basic Metabolic Panel-BMET) (80048-METABOL) TLB-CBC Platelet - w/Differential (85025-CBCD) TLB-Hepatic/Liver Function Pnl (80076-HEPATIC) TLB-Lipid Panel (80061-LIPID) TLB-TSH (Thyroid Stimulating Hormone) (84443-TSH) TLB-Udip ONLY (81003-UDIP)  Problem # 2:  HYPERTENSION (ICD-401.9)  Her updated medication list for this problem includes:    Amlodipine Besylate 10 Mg Tabs (Amlodipine besylate) .Marland Kitchen... Take 1 tablet by mouth once a day    Diovan 320 Mg Tabs (Valsartan) .Marland Kitchen... 1 by mouth once daily  BP today: 140/70 Prior BP: 130/64 (12/17/2008)  Labs Reviewed: K+: 4.1 (08/08/2008) Creat: : 1.1 (  08/08/2008)   Chol: 178 (08/08/2008)   HDL: 96.00 (08/08/2008)   LDL: 74 (08/08/2008)   TG: 42.0 (08/08/2008) stable overall by hx and exam, ok to continue meds/tx as is   Complete Medication List: 1)  Boniva 150 Mg Tabs (Ibandronate sodium) .... One by mouth once a month 2)  Aspirin 325 Mg Tabs (Aspirin) .... Take 1 tablet by mouth once a day 3)  Amlodipine Besylate 10 Mg Tabs (Amlodipine besylate) .... Take 1 tablet by mouth once a day 4)  Calcium-vitamin D 500-125 Mg-unit Tabs (Calcium-vitamin d) .... Take 1 tablet by mouth once a day 5)  Miralax Powd (Polyethylene glycol 3350) .Marland Kitchen.. 17grams two times a day 6)  Simvastatin 20 Mg Tabs (Simvastatin) .... Take 1 tablet by mouth once a day 7)  Diovan 320 Mg Tabs (Valsartan) .Marland Kitchen.. 1 by mouth once daily  Patient Instructions: 1)  Continue all previous medications as before this  visit 2)  Please go to the Lab in the basement for your blood and/or urine tests today 3)  Please schedule a follow-up appointment in 1 year or sooner if needed Prescriptions: BONIVA 150 MG  TABS (IBANDRONATE SODIUM) one by mouth once a month  #3 x 3   Entered and Authorized by:   [name] MD   Signed by:   [name] MD on 09/14/2009   Method used:   Print then Give to Patient   RxID:   [id number]

## 2010-02-18 NOTE — Assessment & Plan Note (Signed)
Summary: DISCUSS RX-LB  CC: Patient refused vitals here to discuss Medication/RE   CC:  Patient refused vitals here to discuss Medication/RE.  History of Present Illness: here quite upset and tense, refused VS or other evaluation today;  as she explains she is very concerned with recent change in her meds;  per pt she had called about a letter in the mail (brings with her today) that indicated benicar was no longer covered on her drug formulary, although she was given a one months supply to start (which pt states cost over $100);  her call had been taken by a triage nurse  and reviewed by myself  - we thought the pt was calling (as other patients have in recent days) to have the prescription changed to a med that was on her formulary;  she apparently was calling simply to try to have the letter explained to her over the phone.  The med was changed to diovan (which is covered on her plan, and pt relates she was billed $84 for 3 mo supply which she has with her today).  She was upset the med was changed "without her knowledge" and is very reluctant to make any change , especaily since the diovan was 320 mg and the benicar was only 40 mg.  She has no new complaints;  Pt denies CP, sob, doe, wheezing, orthopnea, pnd, worsening LE edema, palps, dizziness or syncope   Pt denies new neuro symptoms such as headache, facial or extremity weakness   It is noted she was very rude to the staff today on explaning why she was here and stated at least once she no intention of paying for her visit.    Problems Prior to Update: 1)  Osteopenia  (ICD-733.90) 2)  Spinal Stenosis  (ICD-724.00) 3)  Knee Pain, Bilateral  (ICD-719.46) 4)  Contact Dermatitis  (ICD-692.9) 5)  Leg Pain, Right  (ICD-729.5) 6)  Anemia-iron Deficiency  (ICD-280.9) 7)  Depression  (ICD-311) 8)  Hyperlipidemia  (ICD-272.4) 9)  Anxiety  (ICD-300.00) 10)  Preventive Health Care  (ICD-V70.0) 11)  Cerumen Impaction, Right  (ICD-380.4) 12)  Uri   (ICD-465.9) 13)  Osteoporosis  (ICD-733.00) 14)  Ankle Pain, Left  (ICD-719.47) 15)  Constipation, Chronic  (ICD-564.09) 16)  Nephrolithiasis, Hx of  (ICD-V13.01) 17)  Common Migraine  (ICD-346.10) 18)  Colonic Polyps, Hx of  (ICD-V12.72) 19)  Hypertension  (ICD-401.9)  Medications Prior to Update: 1)  Boniva 150 Mg  Tabs (Ibandronate Sodium) .... One By Mouth Once A Month 2)  Benicar 40 Mg  Tabs (Olmesartan Medoxomil) .... Take 1 Tablet By Mouth Once A Day 3)  Aspirin 325 Mg  Tabs (Aspirin) .... Take 1 Tablet By Mouth Once A Day 4)  Amlodipine Besylate 10 Mg  Tabs (Amlodipine Besylate) .... Take 1 Tablet By Mouth Once A Day 5)  Calcium-Vitamin D 500-125 Mg-Unit  Tabs (Calcium-Vitamin D) .... Take 1 Tablet By Mouth Once A Day 6)  Miralax   Powd (Polyethylene Glycol 3350) .Marland Kitchen.. 17grams Two Times A Day 7)  Simvastatin 20 Mg  Tabs (Simvastatin) .... Take 1 Tablet By Mouth Once A Day 8)  Diovan 320 Mg Tabs (Valsartan) .Marland Kitchen.. 1 By Mouth Once Daily  Current Medications (verified): 1)  Boniva 150 Mg  Tabs (Ibandronate Sodium) .... One By Mouth Once A Month 2)  Benicar 40 Mg  Tabs (Olmesartan Medoxomil) .... Take 1 Tablet By Mouth Once A Day 3)  Aspirin 325 Mg  Tabs (Aspirin) .... Take 1  Tablet By Mouth Once A Day 4)  Amlodipine Besylate 10 Mg  Tabs (Amlodipine Besylate) .... Take 1 Tablet By Mouth Once A Day 5)  Calcium-Vitamin D 500-125 Mg-Unit  Tabs (Calcium-Vitamin D) .... Take 1 Tablet By Mouth Once A Day 6)  Miralax   Powd (Polyethylene Glycol 3350) .Marland Kitchen.. 17grams Two Times A Day 7)  Simvastatin 20 Mg  Tabs (Simvastatin) .... Take 1 Tablet By Mouth Once A Day 8)  Diovan 320 Mg Tabs (Valsartan) .Marland Kitchen.. 1 By Mouth Once Daily  Allergies (verified): 1)  ! Fosamax 2)  ! Maxzide  Past History:  Past Medical History: Last updated: 12/17/2008 Hypertension Colonic polyps, hx of - adenomatous Osteoporosis Cerebrovascular accident with right-leg weakness. 04/2006 Spinal  stenosis Anxiety Hyperlipidemia chronic constipation Nephrolithiasis, hx of Depression Anemia-iron deficiency Osteopenia  Past Surgical History: Last updated: 08/01/2007 Cholecystectomy Hysterectomy Tubal ligation  Social History: Last updated: 04/20/2007 Widowed Lives with her daughter Retired from housekeeping and cooking Never Smoked Alcohol use-no  Risk Factors: Smoking Status: never (04/20/2007)  Review of Systems       all otherwise negative per pt -    Physical Exam  General:  alert and well-developed.  but o/w declines exam today   Impression & Recommendations:  Problem # 1:  HYPERTENSION (ICD-401.9)  The following medications were removed from the medication list:    Benicar 40 Mg Tabs (Olmesartan medoxomil) .Marland Kitchen... Take 1 tablet by mouth once a day Her updated medication list for this problem includes:    Amlodipine Besylate 10 Mg Tabs (Amlodipine besylate) .Marland Kitchen... Take 1 tablet by mouth once a day    Diovan 320 Mg Tabs (Valsartan) .Marland Kitchen... 1 by mouth once daily  Prior BP: 130/64 (12/17/2008)  Labs Reviewed: K+: 4.1 (08/08/2008) Creat: : 1.1 (08/08/2008)   Chol: 178 (08/08/2008)   HDL: 96.00 (08/08/2008)   LDL: 74 (08/08/2008)   TG: 42.0 (08/08/2008)  Much d/w pt today regarind the letter, the meds themselves with lots of education and reassurance, which seems to have relevied much of her tension today.  She seems to understand, agrees to take the diovan and admits that it is less expensive overall for her to do so.  I did not directly d/w pt her behavior today, which was simply noted, to be followed for any other recurrence that might indicate need for psychiatric eval and tx, or dismissal from the practice.  Complete Medication List: 1)  Boniva 150 Mg Tabs (Ibandronate sodium) .... One by mouth once a month 2)  Aspirin 325 Mg Tabs (Aspirin) .... Take 1 tablet by mouth once a day 3)  Amlodipine Besylate 10 Mg Tabs (Amlodipine besylate) .... Take 1 tablet by  mouth once a day 4)  Calcium-vitamin D 500-125 Mg-unit Tabs (Calcium-vitamin d) .... Take 1 tablet by mouth once a day 5)  Miralax Powd (Polyethylene glycol 3350) .Marland Kitchen.. 17grams two times a day 6)  Simvastatin 20 Mg Tabs (Simvastatin) .... Take 1 tablet by mouth once a day 7)  Diovan 320 Mg Tabs (Valsartan) .Marland Kitchen.. 1 by mouth once daily  Patient Instructions: 1)  Continue all previous medications as before this visit , including the diovan. 2)  Stop the benicar. 3)  Followup Dec 2011 for your "yearly exam" with CPX labs

## 2010-02-18 NOTE — Progress Notes (Signed)
Summary: Hearing loss?  Phone Note Call from Patient Call back at Home Phone [phone]   Caller: Patient Summary of Call: pt called stating that she awoke this morning with decrease hearing but now she "can't hear at all". pt is requesting a call from [facility] only. please  advise, OV? Initial call taken by: Margaret Pyle, CMA,  May 12, 2009 1:38 PM  Follow-up for Phone Call        I normally do not do phone consultations;  ? sounds like acute wax impaction ?    It could be she simply needs irrigation of the ear;  please make OV, sooner especially if theres is any pain or fever (or even consider going to ER or urgent care tonight) Follow-up by: [name] MD,  May 12, 2009 5:23 PM  Additional Follow-up for Phone Call Additional follow up Details #1::        pt stated that she believes her reaction was due to allergies because after sneezing and blowing her nose her ears "popped" and she is fine now. Additional Follow-up by: Margaret Pyle, CMA,  May 13, 2009 8:17 AM

## 2010-03-19 ENCOUNTER — Ambulatory Visit (INDEPENDENT_AMBULATORY_CARE_PROVIDER_SITE_OTHER): Payer: Medicare Other | Admitting: Internal Medicine

## 2010-03-19 ENCOUNTER — Other Ambulatory Visit: Payer: Self-pay | Admitting: Internal Medicine

## 2010-03-19 ENCOUNTER — Encounter: Payer: Self-pay | Admitting: Internal Medicine

## 2010-03-19 ENCOUNTER — Other Ambulatory Visit: Payer: Medicare Other

## 2010-03-19 DIAGNOSIS — I10 Essential (primary) hypertension: Secondary | ICD-10-CM

## 2010-03-19 DIAGNOSIS — IMO0002 Reserved for concepts with insufficient information to code with codable children: Secondary | ICD-10-CM

## 2010-03-19 DIAGNOSIS — D509 Iron deficiency anemia, unspecified: Secondary | ICD-10-CM

## 2010-03-19 DIAGNOSIS — M7989 Other specified soft tissue disorders: Secondary | ICD-10-CM

## 2010-03-19 DIAGNOSIS — M1710 Unilateral primary osteoarthritis, unspecified knee: Secondary | ICD-10-CM

## 2010-03-19 DIAGNOSIS — E785 Hyperlipidemia, unspecified: Secondary | ICD-10-CM

## 2010-03-19 HISTORY — DX: Other specified soft tissue disorders: M79.89

## 2010-03-19 HISTORY — DX: Reserved for concepts with insufficient information to code with codable children: IMO0002

## 2010-03-19 LAB — BASIC METABOLIC PANEL
BUN: 16 mg/dL (ref 6–23)
Calcium: 9.6 mg/dL (ref 8.4–10.5)
Creatinine, Ser: 1.1 mg/dL (ref 0.4–1.2)
GFR: 60.46 mL/min (ref >60.00)
Glucose, Bld: 87 mg/dL (ref 70–99)

## 2010-03-19 LAB — CBC WITH DIFFERENTIAL/PLATELET
Basophils Relative: 0.4 % (ref 0.0–3.0)
Eosinophils Relative: 1 % (ref 0.0–5.0)
HCT: 32.5 % — ABNORMAL LOW (ref 36.0–46.0)
Lymphs Abs: 1.3 10*3/uL (ref 0.7–4.0)
MCV: 91.5 fl (ref 78.0–100.0)
Monocytes Absolute: 0.4 10*3/uL (ref 0.1–1.0)
Monocytes Relative: 8.6 % (ref 3.0–12.0)
Neutrophils Relative %: 64.8 % (ref 43.0–77.0)
RBC: 3.55 Mil/uL — ABNORMAL LOW (ref 3.87–5.11)
WBC: 5.1 10*3/uL (ref 4.5–10.5)

## 2010-03-19 LAB — IBC PANEL: Transferrin: 302 mg/dL (ref 212.0–360.0)

## 2010-03-19 LAB — LIPID PANEL: Total CHOL/HDL Ratio: 2

## 2010-03-24 ENCOUNTER — Other Ambulatory Visit: Payer: Self-pay | Admitting: Internal Medicine

## 2010-03-24 DIAGNOSIS — IMO0002 Reserved for concepts with insufficient information to code with codable children: Secondary | ICD-10-CM

## 2010-03-24 DIAGNOSIS — M7989 Other specified soft tissue disorders: Secondary | ICD-10-CM

## 2010-03-25 NOTE — Assessment & Plan Note (Signed)
Summary: pain in both legs/lb   Vital Signs:  Patient profile:   77 year old female Height:      67 inches Weight:      130 pounds BMI:     20.43 O2 Sat:      98 % on Room air Temp:     97.9 degrees F oral Pulse rate:   76 / minute BP sitting:   130 / 62  (left arm) Cuff size:   regular  Vitals Entered By: Zella Ball Ewing CMA (AAMA) (March 19, 2010 10:04 AM)  O2 Flow:  Room air CC: Both Legs pain and stiffness has worsened/RE   CC:  Both Legs pain and stiffness has worsened/RE.  History of Present Illness: here with 3 months graudally worsening right leg pain and stiffness, moderate, primarily involves the right knee, but also some pain to the right lower back , with some pain to the leg below the knee as well, hard to say if really radicalur; no changes to bowel or bladder function, fever, wt loss , giat change or fall (in fact has gained a few lbs). Right handed.  also with different kind of pain and stiffness to the LLE, with visible swelling mostly to the left thigh, but also some swelling to the more distal leg as well; pain worst at the left knee and just below, started early feb 2012 she noted as she got out of the shower ;  Pt denies CP, worsening sob, doe, wheezing, orthopnea, pnd, other worsening LE edema, palps, dizziness or syncope    Pt denies new neuro symptoms such as headache, facial or extremity weakness .  Pt denies polydipsia, polyuria.  No fever, wt loss, night sweats, loss of appetite or other constitutional symptoms  Denies worsening depressive symptoms, suicidal ideation, or panic.  Overall good compliance with meds, and good tolerability.   Problems Prior to Update: 1)  Degenerative Joint Disease, Knees, Bilateral  (ICD-715.96) 2)  Lumbar Radiculopathy, Right  (ICD-724.4) 3)  Swelling of Limb  (ICD-729.81) 4)  Back Pain, Chronic, Intermittent  (ICD-724.5) 5)  Osteopenia  (ICD-733.90) 6)  Spinal Stenosis  (ICD-724.00) 7)  Knee Pain, Bilateral  (ICD-719.46) 8)   Contact Dermatitis  (ICD-692.9) 9)  Leg Pain, Right  (ICD-729.5) 10)  Anemia-iron Deficiency  (ICD-280.9) 11)  Depression  (ICD-311) 12)  Hyperlipidemia  (ICD-272.4) 13)  Anxiety  (ICD-300.00) 14)  Preventive Health Care  (ICD-V70.0) 15)  Cerumen Impaction, Right  (ICD-380.4) 16)  Uri  (ICD-465.9) 17)  Osteoporosis  (ICD-733.00) 18)  Ankle Pain, Left  (ICD-719.47) 19)  Constipation, Chronic  (ICD-564.09) 20)  Nephrolithiasis, Hx of  (ICD-V13.01) 21)  Common Migraine  (ICD-346.10) 22)  Colonic Polyps, Hx of  (ICD-V12.72) 23)  Hypertension  (ICD-401.9)  Medications Prior to Update: 1)  Boniva 150 Mg  Tabs (Ibandronate Sodium) .... One By Mouth Once A Month 2)  Aspirin 325 Mg  Tabs (Aspirin) .... Take 1 Tablet By Mouth Once A Day 3)  Amlodipine Besylate 10 Mg  Tabs (Amlodipine Besylate) .... Take 1 Tablet By Mouth Once A Day 4)  Calcium-Vitamin D 500-125 Mg-Unit  Tabs (Calcium-Vitamin D) .... Take 1 Tablet By Mouth Once A Day 5)  Miralax   Powd (Polyethylene Glycol 3350) .Marland Kitchen.. 17grams Two Times A Day 6)  Simvastatin 20 Mg  Tabs (Simvastatin) .... Take 1 Tablet By Mouth Once A Day 7)  Diovan 320 Mg Tabs (Valsartan) .Marland Kitchen.. 1 By Mouth Once Daily 8)  Zovirax 5 % Crea (  Acyclovir) .... Use Asd 5 Times Per Day As Needed  Current Medications (verified): 1)  Boniva 150 Mg  Tabs (Ibandronate Sodium) .... One By Mouth Once A Month 2)  Aspirin 325 Mg  Tabs (Aspirin) .... Take 1 Tablet By Mouth Once A Day 3)  Amlodipine Besylate 10 Mg  Tabs (Amlodipine Besylate) .... Take 1 Tablet By Mouth Once A Day 4)  Calcium-Vitamin D 500-125 Mg-Unit  Tabs (Calcium-Vitamin D) .... Take 1 Tablet By Mouth Once A Day 5)  Miralax   Powd (Polyethylene Glycol 3350) .Marland Kitchen.. 17grams Two Times A Day 6)  Simvastatin 20 Mg  Tabs (Simvastatin) .... Take 1 Tablet By Mouth Once A Day 7)  Diovan 320 Mg Tabs (Valsartan) .Marland Kitchen.. 1 By Mouth Once Daily 8)  Zovirax 5 % Crea (Acyclovir) .... Use Asd 5 Times Per Day As Needed 9)  Tramadol  Hcl 50 Mg Tabs (Tramadol Hcl) .Marland Kitchen.. 1po Q 6 Hrs As Needed Pain  Allergies (verified): 1)  ! Fosamax 2)  ! Maxzide  Past History:  Past Medical History: Last updated: 12/17/2008 Hypertension Colonic polyps, hx of - adenomatous Osteoporosis Cerebrovascular accident with right-leg weakness. 04/2006 Spinal stenosis Anxiety Hyperlipidemia chronic constipation Nephrolithiasis, hx of Depression Anemia-iron deficiency Osteopenia  Past Surgical History: Last updated: 08/01/2007 Cholecystectomy Hysterectomy Tubal ligation  Social History: Last updated: 09/14/2009 Widowed Duaghter lives with her, has mild handcap- chronic mental Retired from housekeeping and cooking Never Smoked Alcohol use-no Drug use-no  Risk Factors: Smoking Status: never (04/20/2007)  Review of Systems       all otherwise negative per pt -    Physical Exam  General:  alert and underweight appearing.   Head:  normocephalic and atraumatic.   Eyes:  vision grossly intact, pupils equal, and pupils round.   Ears:  R ear normal and L ear normal.   Nose:  no external deformity and no nasal discharge.   Mouth:  no gingival abnormalities and pharynx pink and moist.   Neck:  supple and no masses.   Lungs:  normal respiratory effort and normal breath sounds.   Heart:  normal rate and regular rhythm.   Abdomen:  soft, non-tender, and normal bowel sounds.   Msk:  spine nontender , no paravertebral tender/sweling;  bilat knee bony changes without effusion;  left ant thigh/quads area with unsual diffuse swelling/no bruise or erythema Pulses:  1+ bilat dorsalis pedis Extremities:  mild left thigh swelling, nontender Neurologic:  strength normal in all extremities and DTRs symmetrical and normal.   Skin:  color normal and no rashes.   Psych:  not depressed appearing and slightly anxious.     Impression & Recommendations:  Problem # 1:  SWELLING OF LIMB (ICD-729.81)  unusual left thigh primarily quadriceps  area, with no signifcant edema below the knees - ? soft tissue sarcoma type process - for MRI left thigh with and without CM  Orders: Radiology Referral (Radiology)  Problem # 2:  LUMBAR RADICULOPATHY, RIGHT (ICD-724.4)  Her updated medication list for this problem includes:    Aspirin 325 Mg Tabs (Aspirin) .Marland Kitchen... Take 1 tablet by mouth once a day    Tramadol Hcl 50 Mg Tabs (Tramadol hcl) .Marland Kitchen... 1po q 6 hrs as needed pain with known lumbar disc disease and spinal stenosis , now with worsening pain that seems radicualr - for MRI LS Spine, cont same meds for now, and refer ortho   Discussed use of moist heat or ice, modified activities, medications, and stretching/strengthening exercises  Orders:  Radiology Referral (Radiology) Orthopedic Surgeon Referral (Ortho Surgeon)  Problem # 3:  DEGENERATIVE JOINT DISEASE, KNEES, BILATERAL (ICD-715.96)  Her updated medication list for this problem includes:    Aspirin 325 Mg Tabs (Aspirin) .Marland Kitchen... Take 1 tablet by mouth once a day    Tramadol Hcl 50 Mg Tabs (Tramadol hcl) .Marland Kitchen... 1po q 6 hrs as needed pain without effusion but with pain, not clear how much related to worsening LBP and the unsusual left thigh swelling;  for tramadol as needed   Discussed strengthening exercises, use of ice or heat, and medications.   Orders: Orthopedic Surgeon Referral (Ortho Surgeon)  Problem # 4:  HYPERTENSION (ICD-401.9)  Her updated medication list for this problem includes:    Amlodipine Besylate 10 Mg Tabs (Amlodipine besylate) .Marland Kitchen... Take 1 tablet by mouth once a day    Diovan 320 Mg Tabs (Valsartan) .Marland Kitchen... 1 by mouth once daily  Orders: TLB-BMP (Basic Metabolic Panel-BMET) (80048-METABOL)  BP today: 130/62 Prior BP: 120/68 (02/10/2010)  Labs Reviewed: K+: 4.3 (09/14/2009) Creat: : 1.1 (09/14/2009)   Chol: 185 (09/14/2009)   HDL: 88.70 (09/14/2009)   LDL: 86 (09/14/2009)   TG: 51.0 (09/14/2009) stable overall by hx and exam, ok to continue meds/tx as is    Complete Medication List: 1)  Boniva 150 Mg Tabs (Ibandronate sodium) .... One by mouth once a month 2)  Aspirin 325 Mg Tabs (Aspirin) .... Take 1 tablet by mouth once a day 3)  Amlodipine Besylate 10 Mg Tabs (Amlodipine besylate) .... Take 1 tablet by mouth once a day 4)  Calcium-vitamin D 500-125 Mg-unit Tabs (Calcium-vitamin d) .... Take 1 tablet by mouth once a day 5)  Miralax Powd (Polyethylene glycol 3350) .Marland Kitchen.. 17grams two times a day 6)  Simvastatin 20 Mg Tabs (Simvastatin) .... Take 1 tablet by mouth once a day 7)  Diovan 320 Mg Tabs (Valsartan) .Marland Kitchen.. 1 by mouth once daily 8)  Zovirax 5 % Crea (Acyclovir) .... Use asd 5 times per day as needed 9)  Tramadol Hcl 50 Mg Tabs (Tramadol hcl) .Marland Kitchen.. 1po q 6 hrs as needed pain  Other Orders: TLB-Lipid Panel (80061-LIPID) TLB-CBC Platelet - w/Differential (85025-CBCD) TLB-IBC Pnl (Iron/FE;Transferrin) (83550-IBC)  Patient Instructions: 1)  Please take all new medications as prescribed - the pain medication 2)  Continue all previous medications as before this visit 3)  Please go to the Lab in the basement for your blood and/or urine tests today 4)  You will be contacted about the referral(s) to: MRI for the lower back, as well as the left thigh, and orthopedic appt 5)  Please schedule a follow-up appointment in 6 months, or sooner if needed , for CPX with labs Prescriptions: TRAMADOL HCL 50 MG TABS (TRAMADOL HCL) 1po q 6 hrs as needed pain  #60 x 1   Entered and Authorized by:   [name] MD   Signed by:   [name] MD on 03/19/2010   Method used:   Print then Give to Patient   RxID:   [id number]    Orders Added: 1)  Radiology Referral [Radiology] 2)  Radiology Referral [Radiology] 3)  Orthopedic Surgeon Referral [Ortho Surgeon] 4)  TLB-BMP (Basic Metabolic Panel-BMET) [80048-METABOL] 5)  TLB-Lipid Panel [80061-LIPID] 6)  TLB-CBC Platelet - w/Differential [85025-CBCD] 7)  TLB-IBC Pnl (Iron/FE;Transferrin)  [83550-IBC] 8)  Est. Patient Level IV [69629]

## 2010-03-31 ENCOUNTER — Other Ambulatory Visit: Payer: Medicare Other

## 2010-04-01 ENCOUNTER — Telehealth (INDEPENDENT_AMBULATORY_CARE_PROVIDER_SITE_OTHER): Payer: Self-pay | Admitting: *Deleted

## 2010-04-06 ENCOUNTER — Other Ambulatory Visit: Payer: Self-pay

## 2010-04-06 MED ORDER — IBANDRONATE SODIUM 150 MG PO TABS: 150.0000 mg | ORAL_TABLET | ORAL | Status: AC

## 2010-04-06 MED ORDER — VALSARTAN 320 MG PO TABS: 320.0000 mg | ORAL_TABLET | Freq: Every day | ORAL | Status: DC

## 2010-04-06 NOTE — Progress Notes (Signed)
----   Converted from flag ---- ---- 03/31/2010 5:08 PM, [name] MD wrote: ok to hold off on LS Spine MRI at this point;  will re-consider pending other MRI;  please notify pt  ---- 03/31/2010 1:57 PM, Shelbie Proctor wrote: DR Coralyn Helling MRI - Mri  LS Spine insurance reguseting to do a peer to peer but pt cancel appt  states she will call back  to reschedule at a later time  , only exam that was approved was MRI of left thigh- pls advise - pt is aware of MRI LS was not approved yet ------------------------------

## 2010-04-12 ENCOUNTER — Other Ambulatory Visit: Payer: Self-pay | Admitting: Internal Medicine

## 2010-04-12 NOTE — Telephone Encounter (Signed)
Pharmacy refill request form [facility] for Amlodipine

## 2010-04-14 ENCOUNTER — Other Ambulatory Visit: Payer: Medicare Other

## 2010-04-14 ENCOUNTER — Other Ambulatory Visit: Payer: Self-pay | Admitting: Internal Medicine

## 2010-04-14 ENCOUNTER — Ambulatory Visit
Admission: RE | Admit: 2010-04-14 | Discharge: 2010-04-14 | Disposition: A | Discharge status: Home / Self Care | Payer: Medicare Other | Source: Ambulatory Visit | Attending: Internal Medicine | Admitting: Internal Medicine

## 2010-04-14 DIAGNOSIS — C4920 Malignant neoplasm of connective and soft tissue of unspecified lower limb, including hip: Secondary | ICD-10-CM

## 2010-04-14 DIAGNOSIS — M7989 Other specified soft tissue disorders: Secondary | ICD-10-CM

## 2010-04-14 HISTORY — DX: Malignant neoplasm of connective and soft tissue of unspecified lower limb, including hip: C49.20

## 2010-04-14 MED ORDER — GADOBENATE DIMEGLUMINE 529 MG/ML IV SOLN: 10.0000 mL | Freq: Once | INTRAVENOUS | Status: AC | PRN

## 2010-04-14 MED ORDER — GADOBENATE DIMEGLUMINE 529 MG/ML IV SOLN
10.0000 mL | Freq: Once | INTRAVENOUS | Status: AC | PRN
  Administered 2010-04-14: 10 mL via INTRAVENOUS

## 2010-05-10 ENCOUNTER — Telehealth: Payer: Self-pay | Admitting: Internal Medicine

## 2010-05-10 NOTE — Telephone Encounter (Signed)
Dr Jonny Ruiz, pt son Roger Shelter would like a know what this referral is for in more detail. His phone number is [phone]. Thanks       I received note above from [facility].  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

## 2010-05-17 ENCOUNTER — Encounter (INDEPENDENT_AMBULATORY_CARE_PROVIDER_SITE_OTHER): Payer: Self-pay | Admitting: General Surgery

## 2010-05-27 ENCOUNTER — Other Ambulatory Visit: Payer: Self-pay | Admitting: Internal Medicine

## 2010-06-04 NOTE — Letter (Signed)
January 22, 2006    Lynn Howell. Lynn Pais, DO  [address] [place], [place] [address]   RE:  Lynn, Howell  MRN:  [id number]  /  DOB:  07/31/1925   Dear Lynn Howell:   Would you be kind enough to assume the primary care of Lynn Howell?  She  is an extremely nice lady I have followed for well over a decade or  more.   She has diagnoses of osteoporosis, hypertension, and chronic  constipation, for which she is followed by Dr. Claudette Howell.  A prior  colonoscopy has revealed an adenoma.  Her surgical history includes  cholecystectomy.  She has had total abdominal hysterectomy and bilateral  salpingo-oophorectomy for dysfunctional menses.  She is gravida 4, para  4.  She also had a tubal remotely.   The family history is positive for myocardial infarction in her father  and brother.  Her grandfather had diabetes, as did a brother.  One of  her brothers did have peripheral vascular disease.   She has never smoked and does not drink.   She has been intolerant or allergic to FOSAMAX, MAXZIDE, and TOPROL.  The MAXZIDE also resulted in hypokalemia.  She felt TOPROL caused edema,  but the edema occurred when the [facility] was discontinued.   At this time, she is on:  1. Benazepril 40 mg 1/2 mg daily.  2. Calcium supplements.  3. GlycoLax from [place].   Her most recent visit was January 4, when she was treated for a cerumen  impaction, which resulted in pain and hearing loss.  At that time, blood  pressure was well-controlled at 136/76.   She did describe right-sided headaches, for which she was not taking any  active therapy.  The headaches could last up to 1/2 hour.  History  suggests it may be related to increased chocolate over the holidays.  I  have asked her to keep a diary and to use Excedrin migraine at the first  sign of the headache.  She had no aura or prodrome specifically.   She stated that she has to make several bus connections to see me at the  [facility] office and asked if I  knew a doctor closer to her  home who could assume her care.  I obviously recommended you highly.  Please have your office schedule a followup visit with Lynn Howell if you agree  to assume her primary care.   Her most recent labs were from January of 2007.  At that time,  potassium, glucose, BUN and creatinine, SGOT and SGPT, and thyroid  functions were normal.  Her lipo profile revealed an LDL of 148, but it  was composed only of 1,169 total particles, and 477 small dense  particles.  Because of the composition and because of an HDL of 83, I  did not elect to treat the LDL of 148.  Obviously, it does require  annual monitoring.    Sincerely,      Lynn Howell. Lynn Ren, MD,FACP,FCCP  Electronically Signed    [facility]  DD: 01/22/2006  DT: 01/22/2006  Job #: [id number]

## 2010-06-04 NOTE — Assessment & Plan Note (Signed)
[facility]                           PRIMARY CARE OFFICE NOTE   KENIYA, SCHLOTTERBECK                          MRN:          [id number]  DATE:02/20/2006                            DOB:          November 09, 1925    CHIEF COMPLAINT:  New patient to practice.   HISTORY OF PRESENT ILLNESS:  The patient is a 77 year old African-  American female here to establish primary care.  She was formerly  followed by Dr. Marga Melnick of the [facility] office but due to  distance she has to travel, she would like to establish here at the [place]  office.  She has a past medical history of hypertension and has been on  benazepril for several years.  She is currently taking 40 mg, no adverse  effects noted.  She has occasionally noted elevated blood pressures at  home, she does not track on a regular basis.   She denies history of heart disease or stroke.   She has a history of colon polyps;screening in 2003 by Dr. Russella Dar.  She  was scheduled for follow-up colonoscopy in 2006 but never showed.   PAST MEDICAL HISTORY SUMMARY:  1. Hypertension.  2. Colon polyps.  3. Remote history of migraines.  4. Status post cholecystectomy.  5. Status post hysterectomy.  6. History of nephrolithiasis in the 1980s.  7. Chronic constipation.   CURRENT MEDICATIONS:  1. Benazepril 40 mg once a day.  2. Calcium 600 mg with vitamin D once a day.  3. Miralax 17 g once a day p.r.n.   ALLERGIES TO MEDICATIONS:  FOSAMAX causes nausea, and MAXZIDE caused  mild low potassium.   SOCIAL HISTORY:  The patient is widowed.  She lives with her daughter,  who is 73 years old.  She is retired from housekeeping and cooking.   FAMILY HISTORY:  Mother deceased in her 79s secondary to uterine cancer.  Father also in his 76s of a presumed MI versus stroke.   HABITS:  She does not drink or smoke.  Denies any previous history of  tobacco use.   REVIEW OF SYSTEMS:  No fevers, chills.  No weight issues.  No  HEENT  symptoms.  Denies any chest pain or shortness of breath.  No heartburn,  nausea, vomiting, constipation, diarrhea, no dark stools or blood in her  stool, and all other systems are negative.   PHYSICAL EXAMINATION:  VITAL SIGNS:  Height is 5 feet 3 inches, weight  is 132 pounds.  Temperature is 98.7, pulse is 76, BP is 160/86 in the  left arm in a seated position.  GENERAL:  The patient is a very pleasant 77 year old African-American  female in no apparent distress.  HEENT:  Normocephalic, atraumatic.  Pupils are equal and reactive to  light bilaterally.  Extraocular movements intact.  The patient was  anicteric.  Conjunctivae were within normal limits.  Evidence of  previous iridectomy bilaterally.  Oropharyngeal exam was unremarkable.  She had an upper dental plate.  NECK:  Supple.  No adenopathy, carotid bruit or thyromegaly.  CHEST:  Normal respiratory effort.  Chest was clear to auscultation  bilaterally.  No rhonchi, rales or wheezing.  CARDIOVASCULAR:  Regular rate and rhythm.  No significant murmurs, rubs  or gallops appreciated.  ABDOMEN:  Soft, nontender, bilateral bowel sounds, no organomegaly.  MUSCULOSKELETAL:  No clubbing, cyanosis, or edema.  SKIN:  Warm and dry.  NEUROLOGIC:  Cranial nerves II-XII were grossly intact.  She was  nonfocal.   IMPRESSION/RECOMMENDATIONS:  1. Hypertension.  Suboptimal control versus white coat hypertension.  2. History of colon polyps.  3. Chronic constipation with laxative use.  4. History of osteopenia, last DEXA 2004.  5. Health maintenance.   RECOMMENDATIONS:  1. The patient is to maintain a blood pressure log over the next 3      weeks' time.  She will try to obtain at least 10 readings before      our next follow-up exam.  She may need additional      antihypertensives.  Repeat her BMET and lipid profile.  I urged the      patient to reschedule her follow-up colonoscopy, and we will      initiate that referral.  2.  Follow-up time is in 3-4 weeks.     Barbette Hair. Artist Pais, DO  Electronically Signed    RDY/MedQ  DD: 02/20/2006  DT: 02/21/2006  Job #: [id number]

## 2010-06-04 NOTE — Discharge Summary (Signed)
Lynn Howell, Lynn Howell                   ACCOUNT NO.:  [id number]   MEDICAL RECORD NO.:  [id number]          PATIENT TYPE:  INP   LOCATION:  3004                         FACILITY:  [facility]   PHYSICIAN:  Valerie A. Felicity Coyer, MDDATE OF BIRTH:  11/05/1924   DATE OF ADMISSION:  04/29/2006  DATE OF DISCHARGE:                               DISCHARGE SUMMARY   DISCHARGE DIAGNOSES:  1. Cerebrovascular accident with right-leg weakness.  2. Dyslipidemia.  3. Hypertension.  4. Spinal stenosis.  5. Chronic constipation   HISTORY OF PRESENT ILLNESS:  Lynn Howell is a 77 year old female who was  admitted on April 29, 2006, with chief complaint of right leg weakness.  She noted on the day prior to admission that her right leg felt very  heavy.  It took her about 15 minutes to stand up.  On the morning of  admission she noted that her leg was still weak to the point where she  could not really walk without assistance.  She was admitted for further  evaluation and treatment.   COURSE OF HOSPITALIZATION:  #1 - Cerebrovascular accident with right  lower extremity weakness.  The patient was admitted and was seen in  consultation by Dr. Kelli Hope of neurology.  MRI of the brain was  performed. MRI noted linear distribution of punctate infarct in the left  frontal convexity in a watershed distribution, as well as remote lacunar  infarcts in the left thalamus and right cerebellum.  MRA was performed  which noted areas of probable stenosis involving the inferior temporal  and CA branch on the right and left A2 segment.  The anterior cerebral  artery stenosis on the left it was felt might have accounted for areas  of acute infarct.  In addition, an MRI of the cervical spine was  performed which noted severe central canal stenosis at C3-C4 and 4-5 due  to central disk protrusions, as well as moderate central canal stenosis  C5-C6.  Hemoglobin A1c was checked which was 5.9.  TSH was normal and  homocystine  was also within normal limits.  She was noted to have  dyslipidemia with a total cholesterol of 230 and LDL of 133.  She will  be started on a statin at time of discharge and will need followup as an  outpatient.   #2 - HYPERTENSION.  The patient was maintained on her home medication  regimen which included benazepril 40 mg p.o. daily as well as Norvasc  2.5 mg p.o. daily.  At time of discharge she was noted to have systolic  blood pressure ranging from 138-159 on current regimen.  She will be  increased from Norvasc 2.5 mg p.o. daily to 5 mg p.o. daily.  She will  need outpatient followup and medication titration as indicated.   The patient was also seen by Dr. Riley Kill from rehab medicine and it was  felt that she was appropriate for home health physical therapy.  We will  request home health PT and OT at time of discharge.  At the time of this  dictation, transcranial Doppler and  2-D echocardiogram results are  currently pending.   MEDICATIONS AT TIME OF DISCHARGE:  1. Benazepril 40 mg p.o. daily.  2. Norvasc 5 mg p.o. daily.  3. Aspirin 325 mg p.o. daily.  4. MiraLax 17 g p.o. daily.  5. Zocor 20 mg p.o. daily in the evening.   PERTINENT LABORATORIES AT TIME OF DISCHARGE:  Homocystine 7.7.  A1c 5.9.  Cholesterol 230, HDL 83, LDL 133.  Hemoglobin 11.6, hematocrit 33.9,  BUN 8, creatinine 0.98.   FOLLOWUP:  The patient is to follow up with Dr. Thomos Lemons in 1-2 weeks  and his nurse will contact the patient with an appointment.  She is  instructed to return to the ER should she develop worsening weakness,  confusion or slurred speech.  We will arrange home health PT and OT  prior to discharge.      Sandford Craze, NP      Raenette Rover. Felicity Coyer, MD  Electronically Signed    MO/MEDQ  D:  05/02/2006  T:  05/02/2006  Job:  [id number]   cc:   Barbette Hair. Artist Pais, DO

## 2010-06-04 NOTE — H&P (Signed)
Lynn Howell, LESPERANCE                   ACCOUNT NO.:  [id number]   MEDICAL RECORD NO.:  [id number]          PATIENT TYPE:  INP   LOCATION:                               FACILITY:  [facility]   PHYSICIAN:  Bruce H. Swords, MD         DATE OF BIRTH:   DATE OF ADMISSION:  04/29/2006  DATE OF DISCHARGE:                              HISTORY & PHYSICAL   CHIEF COMPLAINT:  A 77 year old old white lady with chief complaint of  right leg weakness.   CHIEF COMPLAINT:  Lynn Howell is a 77 year old female who is usually  quite healthy.  She has a one-day history of right leg weakness.  She  states this was sudden onset to the point now where she is dragging the  right leg.  She is unable to walk without a walker.  She did not fall.  She does not know of any injury.  She denies any upper extremity  weakness or facial droop or dysphagia.  She denies any significant pain  other than her chronic bilateral lower extremity which has been ongoing  for years.  She also states that she has chronic trouble with bowel and  bladder incontinence.  She states normally she has to hurry to the  bathroom and does not have any trouble, but just has a fecal and urinary  urgency.  Today, she has the same urge to urinate.  She states that she  just was not able to get to the bath fast enough because of the right  lower extremity weakness.   PAST MEDICAL HISTORY:  Significant for:  1. Hypertension.  2. Chronic constipation.   PAST SURGICAL HISTORY:  1. Bilateral tubal ligation.  2. Cholecystectomy.   FAMILY HISTORY:  Noncontributory.   SOCIAL HISTORY:  She lives with a disabled daughter (mentally retarded).  Her son checks on her frequently and is with her today.  She does not  smoke and does not drink alcohol.   REVIEW OF SYSTEMS:  She denies any chest pain, shortness of breath, PND,  orthopnea.  She denies any other complaints on the complete review of  systems other than those listed above.   PHYSICAL  EXAMINATION:  VITAL SIGNS:  Temperature 98, pulse 70,  respirations 16,  blood pressure 110/70.  GENERAL:  She appears as a well-developed, well-nourished female in no  acute distress.  HEENT: Atraumatic, normocephalic. Extraocular muscles were intact.  NECK:  Supple without lymphadenopathy, thyromegaly, jugular venous  distension, or carotid bruits.  Cranial nerves are intact.  CHEST:  Clear to auscultation without any increased work of breathing.  CARDIAC:  S1 and S2 are normal without murmurs or gallops.  ABDOMEN:  Active bowel sounds, soft, nontender.  There is no  hepatosplenomegaly.  No masses are palpated.  EXTREMITIES:  No clubbing, cyanosis or edema.Marland Kitchen  NEUROLOGIC:  She is alert and oriented.  She has normal range of motion  and sensation in her upper extremities.  She has normal strength in her  left lower extremities.  She is unable to bear weight on  her right leg.  When I asked her to walk, she basically drags her right foot.  She  really is not able to flex or extend her foot.  She is unable lift her  great toe.  Her toes, however, are equivocal on the right,downgoing on  the left.  The left lower extremity is normal.  Reflexes are brisk at  both knees.  Absent at both ankles.   LABORATORY DATA:  None.  Labs are ordered in the hospital.   IMAGING:  None.   ASSESSMENT/PLAN:  Right lower extremity weakness, unclear etiology.  I  do not think this is a stroke.  I would be more worried about a cardiac  event.  Also could just be a simple foot drop related to a peripheral  neuropathy.  I think she needs further evaluation given her sudden  onset.  I will ask Dr. Thad Ranger to see her.  I will admit her to the  hospital in order to give that consult accomplished in the most timely  way.  Will obtain routine laboratory work including CBC, CMET and sed  rate.  Other medical problems I think are stable and will not need  further evaluation in the hospital.      Bruce H. Swords,  MD  Electronically Signed     BHS/MEDQ  D:  04/29/2006  T:  04/29/2006  Job:  [id number]

## 2010-06-04 NOTE — Consult Note (Signed)
NAMECRYSTELLE, Lynn Howell                   ACCOUNT NO.:  [id number]   MEDICAL RECORD NO.:  [id number]          PATIENT TYPE:  INP   LOCATION:  3004                         FACILITY:  [facility]   PHYSICIAN:  Casimiro Needle L. Reynolds, M.D.DATE OF BIRTH:  11/05/1924   DATE OF CONSULTATION:  04/29/2006  DATE OF DISCHARGE:                                 CONSULTATION   REQUESTING PHYSICIAN:  Valetta Mole. Swords, MD/John Carolynn Sayers, MD.   REASON FOR EVALUATION:  Right leg weakness.   HISTORY OF PRESENT ILLNESS:  This is the initial inpatient consultation  evaluation of a 77 year old woman with a past medical history which  includes hypertension.  The patient reports that she was in her usual  excellent state of health as of 2:00 yesterday.  She states that she had  been sitting down for a while.  When she got up to walk her leg felt  very heavy, and it took her about 15 minutes to stand up.  She was able  to get up and get around on her leg, but it clearly seemed weak and felt  heavy.  She denies any numbness in the leg or any tingling or any pain.  She had no associated pain in the back or any difficulty with bowel or  bladder continence or any pain in the other leg.  She had no  corresponding weakness in the right arm or right side of the face and  denied any associated speech difficulty, difficulty swallowing, or  visual change.  This morning, she woke up and the leg was weak or still,  to the point that she really could not walk without assistance.  She had  a walker and was able to get around the house with that.  She was seen  in the [facility] and was subsequently admitted for further  considerations.  She says that nothing like this has ever happened to  her before.  She does have a little bit of pain in the leg which is  chronic and longstanding, but this definitely is an acute change.  She  does not have any fall or any associated injury.   PAST MEDICAL HISTORY:  Positive for  hypertension.  She denies a history  of diabetes or heart disease.   FAMILY HISTORY:  Remarkable for a stroke in her father and brother.   SOCIAL HISTORY:  She lives with a mentally disabled child.  She denies a  history of tobacco or alcohol use.  She is normally independent  activities of daily living.   REVIEW OF SYSTEMS:  Full 75-systems review of systems is negative except  as outlined in the history of present illness and in the admission  nursing record which is reviewed.   MEDICATIONS:  She does not take aspirin chronically.  She does report  taking two medications for hypertension, presumably amlodipine and  benazepril.  She also takes MiraLax to help with chronic constipation.   PHYSICAL EXAMINATION:  VITAL SIGNS:  Blood pressure 98.2, blood pressure  125/76, pulse 89, respirations 16, oxygen saturation 98% on room  air.  GENERAL:  This is a healthy-appearing woman, supine in the hospital bed,  in no evident distress.  HEAD:  Cranium is normocephalic and atraumatic.  Oropharynx benign.  NECK:  Supple without carotid or supraclavicular bruits.  HEART:  Regular rhythm without murmurs.  NEUROLOGIC:  Mental status:  She is awake and alert.  She is fully  oriented to time and place.  Recent and remote memory are intact.  She  is able to name items and repeat phrases without difficulty.  Cranial  Nerves:  Pupils are equal and disc are reactive.  Extraocular movements  are full without nystagmus.  Visual fields are full to confrontation.  Hearing intact to conversational speech.  Distal sensation is intact.  Face, tongue, and palatal movement normal and symmetrical.  Shoulder  shrug and strength is normal.  Motor testing:  Normal bulk and  tone.  There is normal strength in both upper extremities and the left lower  extremity.  In the right lower extremity, she has 4 over 5 strength in  the iliopsoas, 4 minus over 5 strength in the hamstrings, 4+ over 5  strength in the quad, and  less than antigravity strength in the  dorsiflexors of the ankle and toe extensors.  Sensory:  She reports  mostly intact to pinprick sensation on the right lower extremity and the  left, although there may be some subtle differences.  Lower extremity  sensation is intact.  Coordination:  Rapid movements are performed  adequately.  Finger-to-nose performed adequately.  Gait is deferred.  Reflexes are brisk throughout but more so on the right where there is  clonus at the biceps and at the knee.  Toe is neutral on the right and  down on the left.   LABORATORY REVIEW:  CBC:  White count 4.8, hemoglobin 11.6, platelet  226,000.  CMET is normal.  Sedimentation rate is 15.  She has had no  imaging to this point.   IMPRESSION:  Right lower extremity monoparesis.  The pattern of weakness  is upper motor neuron in nature.  Most likely etiology would be a  subcortical stroke.  Cervical spine process is also possible given her  diffuse hyperreflexia involving upper extremities as well.   PLAN:  Aspirin daily.  Will check an MRI of the brain with MRA of the  intracranial circulation, as well as an MRI of the cervical spine.  If a  stroke is demonstrated, she will need a stroke workup.  Will follow with  you.  Thank you for the consultation.      Michael L. Thad Ranger, M.D.  Electronically Signed     MLR/MEDQ  D:  04/29/2006  T:  04/29/2006  Job:  [id number]

## 2010-06-08 ENCOUNTER — Telehealth: Payer: Self-pay | Admitting: *Deleted

## 2010-06-08 NOTE — Telephone Encounter (Signed)
Pt called stating that she is having pain associated with hemorrhoids and she is constipated. Pt has not had BM x 3 days/ Pt is scheduled to have minor surgery tomorrow morning at [facility] and wanted MD to advise on safe medication, either RX or OTC, please advise.   [facility] [address]

## 2010-06-08 NOTE — Telephone Encounter (Signed)
Ok for dulcolox supp or po pill x 1, or senakot OTC x 1 tonight

## 2010-06-08 NOTE — Telephone Encounter (Signed)
Pt advised.

## 2010-06-08 NOTE — Telephone Encounter (Signed)
Patient requesting a call back regarding hemorrhoids.

## 2010-07-07 ENCOUNTER — Other Ambulatory Visit: Payer: Self-pay | Admitting: Internal Medicine

## 2010-07-13 DIAGNOSIS — F411 Generalized anxiety disorder: Secondary | ICD-10-CM

## 2010-07-13 LAB — HM MAMMOGRAPHY: HM Mammogram: NEGATIVE

## 2010-07-15 ENCOUNTER — Ambulatory Visit (INDEPENDENT_AMBULATORY_CARE_PROVIDER_SITE_OTHER): Payer: Medicare Other | Admitting: Internal Medicine

## 2010-07-15 ENCOUNTER — Encounter: Payer: Self-pay | Admitting: Internal Medicine

## 2010-07-15 VITALS — BP 110/68 | HR 79 | Temp 97.8°F | Ht 64.0 in | Wt 132.4 lb

## 2010-07-15 DIAGNOSIS — Z0000 Encounter for general adult medical examination without abnormal findings: Secondary | ICD-10-CM | POA: Insufficient documentation

## 2010-07-15 DIAGNOSIS — C4920 Malignant neoplasm of connective and soft tissue of unspecified lower limb, including hip: Secondary | ICD-10-CM

## 2010-07-15 DIAGNOSIS — M25572 Pain in left ankle and joints of left foot: Secondary | ICD-10-CM

## 2010-07-15 DIAGNOSIS — E785 Hyperlipidemia, unspecified: Secondary | ICD-10-CM

## 2010-07-15 DIAGNOSIS — I10 Essential (primary) hypertension: Secondary | ICD-10-CM

## 2010-07-15 DIAGNOSIS — M25579 Pain in unspecified ankle and joints of unspecified foot: Secondary | ICD-10-CM

## 2010-07-15 MED ORDER — TRAMADOL HCL 50 MG PO TABS: 50.0000 mg | ORAL_TABLET | Freq: Four times a day (QID) | ORAL | Status: AC | PRN

## 2010-07-15 NOTE — Assessment & Plan Note (Signed)
D/w pt - stable overall by hx and exam, most recent data reviewed with pt, and pt to continue medical treatment as before  Lab Results  Component Value Date   LDLCALC 76 03/19/2010

## 2010-07-15 NOTE — Progress Notes (Signed)
Subjective:    Patient ID: Lynn Howell, female    DOB: 17-May-1925, 77 y.o.   MRN: [id number]  HPI Here after being seen at [facility] , biopsy done, malignancy confirmed but she does not want surgury due to age and risk, and plans to f/u there with MRI in September - cant recall what kind of surgeon or name. Son has details if needed.  Today c/o pain to the left knee , grad worse since last September, some pain also to calf, ankle and foot, no swelling except for bony changes to the left knee,  Nonsmoker, exercise does not seem to make worse,  Constant, worse some days than others, now mod to severe;  Has seen ortho about right knee, was recommended arthroscopic surgury but she deferred;  Did accept ESI to right lower back last feb (not sure of name, but was at [facility] ortho);  But has never per pt had eval of left foot/ankle/leg pain and sweling (swelling only later in the day) in terms of imaging, cortisone or any other tx.  No falls, but left knee rarely does seem "weak" where it "might" giveaway.  Specifically just asking for pain meds, OTC med  - an "arthritis tablet" no longer working, and stopped it last feb.    Pt denies chest pain, increased sob or doe, wheezing, orthopnea, PND, increased LE swelling, palpitations, dizziness or syncope.  Pt denies new neurological symptoms such as new headache, or facial or extremity weakness or numbness   Pt denies polydipsia, polyuria  Pt states overall good compliance with meds, trying to follow lower cholesterol, diabetic diet, wt overall stable but little exercise however. Past Medical History  Diagnosis Date  . Hypertension   . Hypercholesteremia   . Stroke   . Arthritis   . ANEMIA-IRON DEFICIENCY 08/01/2007  . ANKLE PAIN, LEFT 01/22/2007  . BACK PAIN, CHRONIC, INTERMITTENT 02/10/2010  . CERUMEN IMPACTION, RIGHT 04/20/2007  . COLONIC POLYPS, HX OF 09/21/2006  . COMMON MIGRAINE 09/21/2006  . CONSTIPATION, CHRONIC 09/21/2006  . CONTACT DERMATITIS 08/01/2008  . 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 DEPRESSION 08/01/2007  . HYPERLIPIDEMIA 08/01/2007  . HYPERTENSION 09/21/2006  . KNEE PAIN, BILATERAL 09/15/2008  . LEG PAIN, RIGHT 03/03/2008  . 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 LUMBAR RADICULOPATHY, RIGHT 03/19/2010  . NEPHROLITHIASIS, HX OF 09/21/2006  . OSTEOPENIA 12/17/2008  . OSTEOPOROSIS 01/22/2007  . SPINAL STENOSIS 12/17/2008  . Swelling of limb 03/19/2010  . URI 04/20/2007   Past Surgical History  Procedure Date  . Cholecystectomy   . Abdominal hysterectomy   . Tubal ligation     reports that she has never smoked. She does not have any smokeless tobacco history on file. She reports that she does not drink alcohol or use illicit drugs. family history includes Cancer in her mother and sister; Diabetes in her brother; and Hypertension in her brother and sister. Allergies  Allergen Reactions  . Alendronate Sodium   . Hydrochlorothiazide W/Triamterene    Current Outpatient Prescriptions on File Prior to Visit  Medication Sig Dispense Refill  . acyclovir (ZOVIRAX) 5 % ointment Use as directed 5 times per day as needed       . amLODipine (NORVASC) 10 MG tablet TAKE 1 TABLET DAILY  90 tablet  3  . aspirin 325 MG tablet Take 325 mg by mouth daily.        . polyethylene glycol powder (GLYCOLAX/MIRALAX) powder 17grams TWICE DAILY  119 g  11  .  simvastatin (ZOCOR) 20 MG tablet TAKE 1 TABLET DAILY  90 tablet  1  . valsartan (DIOVAN) 320 MG tablet Take 1 tablet (320 mg total) by mouth daily.  90 tablet  3  . ibandronate (BONIVA) 150 MG tablet Take 1 tablet (150 mg total) by mouth every 30 (thirty) days. Take in the morning with a full glass of water, on an empty stomach, and do not take anything else by mouth or lie down for the next 30 min.  3 tablet  3   Review of Systems Review of Systems  Constitutional: Negative for diaphoresis and unexpected weight change.  HENT: Negative for drooling and tinnitus.   Eyes: Negative for photophobia and  visual disturbance.  Respiratory: Negative for choking and stridor.   Gastrointestinal: Negative for vomiting and blood in stool.  Genitourinary: Negative for hematuria and decreased urine volume.        Objective:   Physical Exam BP 110/68  Pulse 79  Temp(Src) 97.8 F (36.6 C) (Oral)  Ht 5\' 4"  (1.626 m)  Wt 132 lb 6 oz (60.045 kg)  BMI 22.72 kg/m2  SpO2 98% Physical Exam  VS noted Constitutional: Pt appears well-developed and well-nourished.  HENT: Head: Normocephalic.  Right Ear: External ear normal.  Left Ear: External ear normal.  Eyes: Conjunctivae and EOM are normal. Pupils are equal, round, and reactive to light.  Neck: Normal range of motion. Neck supple.  Cardiovascular: Normal rate and regular rhythm.   Pulmonary/Chest: Effort normal and breath sounds normal.  Abd:  Soft, NT, non-distended, + BS Neurological: Pt is alert. No cranial nerve deficit.  Skin: Skin is warm. No erythema.  Psychiatric: Pt behavior is normal. Thought content normal. Left ankle and foot without tenderness, swelling, erythema, ulcer ; dorsalis pedis 1+       Assessment & Plan:

## 2010-07-15 NOTE — Assessment & Plan Note (Signed)
stable overall by hx and exam, most recent data reviewed with pt, and pt to continue medical treatment as before  BP Readings from Last 3 Encounters:  07/15/10 110/68  03/19/10 130/62  02/10/10 120/68

## 2010-07-15 NOTE — Patient Instructions (Addendum)
Take all new medications as prescribed - the pain medication Continue all other medications as before Please keep your appointments with your specialists as you have planned - September MRI and surgury followup You will be contacted regarding the referral for: orthopedic Please return in Sept 4, 2012 with Lab testing done 3-5 days before  .

## 2010-07-15 NOTE — Assessment & Plan Note (Signed)
O/w asympt - No pain assoc with this, encouraged pt to f/u with MRI and [facility] surgury as planned

## 2010-07-15 NOTE — Assessment & Plan Note (Signed)
Unclear etiology;  For ortho referral, ? Ankle DJD but not obvious by exam, o/w neurovasc intact, for pain med prn

## 2010-08-02 ENCOUNTER — Telehealth: Payer: Self-pay | Admitting: *Deleted

## 2010-08-02 MED ORDER — HYDROCODONE-ACETAMINOPHEN 5-325 MG PO TABS: 2.0000 | ORAL_TABLET | Freq: Four times a day (QID) | ORAL | Status: DC | PRN

## 2010-08-02 NOTE — Telephone Encounter (Signed)
Pt c/o Tramadol Rx causing nausea and would like to know if she can get an alternative pain medication.?

## 2010-08-02 NOTE — Telephone Encounter (Signed)
Pt with left leg sarcoma  Ok for hydrocodone 5 325 asd prn

## 2010-08-02 NOTE — Telephone Encounter (Signed)
Rx Done. Pt Informed.

## 2010-09-13 ENCOUNTER — Other Ambulatory Visit (INDEPENDENT_AMBULATORY_CARE_PROVIDER_SITE_OTHER): Payer: Medicare Other

## 2010-09-13 ENCOUNTER — Other Ambulatory Visit: Payer: Self-pay | Admitting: Internal Medicine

## 2010-09-13 DIAGNOSIS — E785 Hyperlipidemia, unspecified: Secondary | ICD-10-CM

## 2010-09-13 DIAGNOSIS — I10 Essential (primary) hypertension: Secondary | ICD-10-CM

## 2010-09-13 DIAGNOSIS — Z0000 Encounter for general adult medical examination without abnormal findings: Secondary | ICD-10-CM

## 2010-09-13 LAB — URINALYSIS, ROUTINE W REFLEX MICROSCOPIC
Bilirubin Urine: NEGATIVE
Hgb urine dipstick: NEGATIVE
Ketones, ur: NEGATIVE
Leukocytes, UA: NEGATIVE
Nitrite: NEGATIVE
Urobilinogen, UA: 0.2 (ref 0.0–1.0)

## 2010-09-13 LAB — LIPID PANEL
HDL: 92 mg/dL (ref >39.00)
VLDL: 14 mg/dL (ref 0.0–40.0)

## 2010-09-13 LAB — CBC WITH DIFFERENTIAL/PLATELET
Basophils Absolute: 0 10*3/uL (ref 0.0–0.1)
Eosinophils Absolute: 0 10*3/uL (ref 0.0–0.7)
Eosinophils Relative: 1 % (ref 0.0–5.0)
HCT: 34.2 % — ABNORMAL LOW (ref 36.0–46.0)
Lymphs Abs: 1.9 10*3/uL (ref 0.7–4.0)
MCHC: 33.3 g/dL (ref 30.0–36.0)
MCV: 90.4 fl (ref 78.0–100.0)
Monocytes Absolute: 0.4 10*3/uL (ref 0.1–1.0)
Platelets: 225 10*3/uL (ref 150.0–400.0)
RDW: 14.4 % (ref 11.5–14.6)

## 2010-09-13 LAB — BASIC METABOLIC PANEL
Chloride: 106 mEq/L (ref 96–112)
GFR: 61.67 mL/min (ref >60.00)
Glucose, Bld: 61 mg/dL — ABNORMAL LOW (ref 70–99)
Potassium: 3.7 mEq/L (ref 3.5–5.1)
Sodium: 142 mEq/L (ref 135–145)

## 2010-09-13 LAB — HEPATIC FUNCTION PANEL
ALT: 16 U/L (ref 0–35)
Total Bilirubin: 0.2 mg/dL — ABNORMAL LOW (ref 0.3–1.2)

## 2010-09-13 LAB — LDL CHOLESTEROL, DIRECT: Direct LDL: 105.2 mg/dL

## 2010-09-21 ENCOUNTER — Encounter: Payer: Medicare Other | Admitting: Internal Medicine

## 2010-10-20 ENCOUNTER — Encounter: Payer: Self-pay | Admitting: Internal Medicine

## 2010-10-20 ENCOUNTER — Ambulatory Visit (INDEPENDENT_AMBULATORY_CARE_PROVIDER_SITE_OTHER): Payer: Medicare Other | Admitting: Internal Medicine

## 2010-10-20 VITALS — BP 120/62 | HR 82 | Temp 97.4°F | Ht 64.0 in | Wt 129.0 lb

## 2010-10-20 DIAGNOSIS — I10 Essential (primary) hypertension: Secondary | ICD-10-CM

## 2010-10-20 DIAGNOSIS — F329 Major depressive disorder, single episode, unspecified: Secondary | ICD-10-CM

## 2010-10-20 DIAGNOSIS — E785 Hyperlipidemia, unspecified: Secondary | ICD-10-CM

## 2010-10-20 DIAGNOSIS — Z0000 Encounter for general adult medical examination without abnormal findings: Secondary | ICD-10-CM

## 2010-10-20 DIAGNOSIS — D509 Iron deficiency anemia, unspecified: Secondary | ICD-10-CM

## 2010-10-20 NOTE — Assessment & Plan Note (Signed)
stable overall by hx and exam, most recent data reviewed with pt, and pt to continue medical treatment as before  Lab Results  Component Value Date   LDLCALC 76 03/19/2010

## 2010-10-20 NOTE — Assessment & Plan Note (Signed)
stable overall by hx and exam, most recent data reviewed with pt, and pt to continue medical treatment as before  Lab Results  Component Value Date   WBC 4.8 09/13/2010   HGB 11.4* 09/13/2010   HCT 34.2* 09/13/2010   PLT 225.0 09/13/2010   CHOL 206* 09/13/2010   TRIG 70.0 09/13/2010   HDL 92.00 09/13/2010   LDLDIRECT 105.2 09/13/2010   ALT 16 09/13/2010   AST 26 09/13/2010   NA 142 09/13/2010   K 3.7 09/13/2010   CL 106 09/13/2010   CREATININE 1.1 09/13/2010   BUN 15 09/13/2010   CO2 26 09/13/2010   TSH 1.37 09/13/2010

## 2010-10-20 NOTE — Progress Notes (Signed)
Subjective:    Patient ID: Lynn Howell, female    DOB: [birth year], 77 y.o.   MRN: [id number]  HPI  Overall doing ok; Here to f/u; overall doing ok,  Pt denies chest pain, increased sob or doe, wheezing, orthopnea, PND, increased LE swelling, palpitations, dizziness or syncope.  Pt denies new neurological symptoms such as new headache, or facial or extremity weakness or numbness   Pt denies polydipsia, polyuria,  Pt states overall good compliance with meds, trying to follow lower cholesterol diet, wt overall stable but little exercise however. 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   Pt denies fever, wt loss, night sweats, loss of appetite, or other constitutional symptoms  Does have sense of ongoing fatigue, but denies signficant hypersomnolence. Denies worsening depressive symptoms, suicidal ideation, or panic, though has ongoing anxiety, not increased recently.   Past Medical History  Diagnosis Date  . Hypertension   . Hypercholesteremia   . Stroke   . Arthritis   . ANEMIA-IRON DEFICIENCY 08/01/2007  . ANKLE PAIN, LEFT 01/22/2007  . BACK PAIN, CHRONIC, INTERMITTENT 02/10/2010  . CERUMEN IMPACTION, RIGHT 04/20/2007  . COLONIC POLYPS, HX OF 09/21/2006  . COMMON MIGRAINE 09/21/2006  . CONSTIPATION, CHRONIC 09/21/2006  . CONTACT DERMATITIS 08/01/2008  . DEGENERATIVE JOINT DISEASE, KNEES, BILATERAL 03/19/2010  . DEPRESSION 08/01/2007  . HYPERLIPIDEMIA 08/01/2007  . HYPERTENSION 09/21/2006  . KNEE PAIN, BILATERAL 09/15/2008  . LEG PAIN, RIGHT 03/03/2008  . 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 LUMBAR RADICULOPATHY, RIGHT 03/19/2010  . NEPHROLITHIASIS, HX OF 09/21/2006  . OSTEOPENIA 12/17/2008  . OSTEOPOROSIS 01/22/2007  . SPINAL STENOSIS 12/17/2008  . Swelling of limb 03/19/2010  . URI 04/20/2007   Past Surgical History  Procedure Date  . Cholecystectomy   . Abdominal hysterectomy   . Tubal ligation     reports that she has never  smoked. She does not have any smokeless tobacco history on file. She reports that she does not drink alcohol or use illicit drugs. family history includes Cancer in her mother and sister; Diabetes in her brother; and Hypertension in her brother and sister. Allergies  Allergen Reactions  . Alendronate Sodium   . Hydrochlorothiazide W/Triamterene   . Norco (Hydrocodone-Acetaminophen) Nausea Only   Current Outpatient Prescriptions on File Prior to Visit  Medication Sig Dispense Refill  . acyclovir (ZOVIRAX) 5 % ointment Use as directed 5 times per day as needed       . amLODipine (NORVASC) 10 MG tablet TAKE 1 TABLET DAILY  90 tablet  3  . aspirin 325 MG tablet Take 325 mg by mouth daily.        Marland Kitchen ibandronate (BONIVA) 150 MG tablet Take 1 tablet (150 mg total) by mouth every 30 (thirty) days. Take in the morning with a full glass of water, on an empty stomach, and do not take anything else by mouth or lie down for the next 30 min.  3 tablet  3  . polyethylene glycol powder (GLYCOLAX/MIRALAX) powder 17grams TWICE DAILY  119 g  11  . simvastatin (ZOCOR) 20 MG tablet TAKE 1 TABLET DAILY  90 tablet  1  . valsartan (DIOVAN) 320 MG tablet Take 1 tablet (320 mg total) by mouth daily.  90 tablet  3   Review of Systems Review of Systems  Constitutional: Negative for diaphoresis and unexpected weight change.  HENT: Negative for drooling and tinnitus.   Eyes:  Negative for photophobia and visual disturbance.  Respiratory: Negative for choking and stridor.   Gastrointestinal: Negative for vomiting and blood in stool.  Genitourinary: Negative for hematuria and decreased urine volume.  Musculoskeletal: Negative for gait problem.  Skin: Negative for color change and wound.  Neurological: Negative for tremors and numbness.  Psychiatric/Behavioral: Negative for decreased concentration. The patient is not hyperactive.      Objective:   Physical Exam BP 120/62  Pulse 82  Temp(Src) 97.4 F (36.3 C) (Oral)   Ht 5\' 4"  (1.626 m)  Wt 129 lb (58.514 kg)  BMI 22.14 kg/m2  SpO2 95% Physical Exam  VS noted Constitutional: Pt is oriented to person, place, and time. Appears well-developed and well-nourished.  HENT:  Head: Normocephalic and atraumatic.  Right Ear: External ear normal.  Left Ear: External ear normal.  Nose: Nose normal.  Mouth/Throat: Oropharynx is clear and moist.  Eyes: Conjunctivae and EOM are normal. Pupils are equal, round, and reactive to light.  Neck: Normal range of motion. Neck supple. No JVD present. No tracheal deviation present.  Cardiovascular: Normal rate, regular rhythm, normal heart sounds and intact distal pulses.   Pulmonary/Chest: Effort normal and breath sounds normal.  Abdominal: Soft. Bowel sounds are normal. There is no tenderness.  Musculoskeletal: Normal range of motion. Exhibits no edema. Left thigh with abnormal diffuse swelling , NT d/w liposarcoma Lymphadenopathy:  Has no cervical adenopathy.  Neurological: Pt is alert and oriented to person, place, and time. Pt has normal reflexes. No cranial nerve deficit.  Skin: Skin is warm and dry. No rash noted.  Psychiatric:  Has  normal mood and affect. Behavior is normal. not depressed or nervous today    Assessment & Plan:

## 2010-10-20 NOTE — Assessment & Plan Note (Signed)
stable overall by hx and exam, most recent data reviewed with pt, and pt to continue medical treatment as before  Lab Results  Component Value Date   HGB 11.4* 09/13/2010

## 2010-10-20 NOTE — Assessment & Plan Note (Signed)
stable overall by hx and exam, most recent data reviewed with pt, and pt to continue medical treatment as before  BP Readings from Last 3 Encounters:  10/20/10 120/62  07/15/10 110/68  03/19/10 130/62   ECG reviewed as per emr

## 2010-10-20 NOTE — Patient Instructions (Signed)
Continue all other medications as before Please keep your appointments with your specialists as you have planned [facility] for the left thigh All blood tests were good today Please continue your lower cholesterol diet Please have your pharmacy call if you need refills Your mammogram is scheduled later today Please return if you change your mind about the flu shot Please return in 6 months, or sooner if needed

## 2011-01-27 ENCOUNTER — Other Ambulatory Visit: Payer: Self-pay

## 2011-01-27 MED ORDER — AMLODIPINE BESYLATE 10 MG PO TABS: 10.0000 mg | ORAL_TABLET | Freq: Every day | ORAL | Status: DC

## 2011-01-27 MED ORDER — SIMVASTATIN 20 MG PO TABS: 20.0000 mg | ORAL_TABLET | Freq: Every day | ORAL | Status: DC

## 2011-01-27 MED ORDER — VALSARTAN 320 MG PO TABS: 320.0000 mg | ORAL_TABLET | Freq: Every day | ORAL | Status: DC

## 2011-04-25 ENCOUNTER — Encounter: Payer: Self-pay | Admitting: Internal Medicine

## 2011-04-25 ENCOUNTER — Ambulatory Visit (INDEPENDENT_AMBULATORY_CARE_PROVIDER_SITE_OTHER): Payer: Medicare Other | Admitting: Internal Medicine

## 2011-04-25 VITALS — BP 122/62 | HR 80 | Temp 97.0°F | Ht 67.0 in | Wt 131.1 lb

## 2011-04-25 DIAGNOSIS — F329 Major depressive disorder, single episode, unspecified: Secondary | ICD-10-CM

## 2011-04-25 DIAGNOSIS — E785 Hyperlipidemia, unspecified: Secondary | ICD-10-CM

## 2011-04-25 DIAGNOSIS — I10 Essential (primary) hypertension: Secondary | ICD-10-CM

## 2011-04-25 DIAGNOSIS — Z0000 Encounter for general adult medical examination without abnormal findings: Secondary | ICD-10-CM

## 2011-04-25 DIAGNOSIS — M1710 Unilateral primary osteoarthritis, unspecified knee: Secondary | ICD-10-CM

## 2011-04-25 NOTE — Progress Notes (Signed)
Subjective:    Patient ID: Lynn Howell, female    DOB: 11/05/1924, 78 y.o.   MRN: [id number]  HPI   Here to f/u;  Overall doing very well,  Has some mobility issue with her LLE and bilat knees but overall mild, denies signficant worsening pain, falls, or other complaints at this time.  Did see [facility] with f/u MRI - no change in left leg tumor per pt, no changes or further tx at this time.  Does have some edema to the leg LLE chronic stable, usually wears support hose that helps. Pt denies chest pain, increased sob or doe, wheezing, orthopnea, PND, increased LE swelling, palpitations, dizziness or syncope.  Pt denies new neurological symptoms such as new headache, or facial or extremity weakness or numbness  Pt denies polydipsia, polyuria.   Pt denies fever, wt loss, night sweats, loss of appetite, or other constitutional symptoms  Denies worsening depressive symptoms, suicidal ideation, or panic,  Overall good compliance with treatment, and good medicine tolerability.  Trying to follow low chol diet Past Medical History  Diagnosis Date  . Hypertension   . Hypercholesteremia   . Stroke   . Arthritis   . ANEMIA-IRON DEFICIENCY 08/01/2007  . ANKLE PAIN, LEFT 01/22/2007  . BACK PAIN, CHRONIC, INTERMITTENT 02/10/2010  . CERUMEN IMPACTION, RIGHT 04/20/2007  . COLONIC POLYPS, HX OF 09/21/2006  . COMMON MIGRAINE 09/21/2006  . CONSTIPATION, CHRONIC 09/21/2006  . CONTACT DERMATITIS 08/01/2008  . DEGENERATIVE JOINT DISEASE, KNEES, BILATERAL 03/19/2010  . DEPRESSION 08/01/2007  . HYPERLIPIDEMIA 08/01/2007  . HYPERTENSION 09/21/2006  . KNEE PAIN, BILATERAL 09/15/2008  . LEG PAIN, RIGHT 03/03/2008  . 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 LUMBAR RADICULOPATHY, RIGHT 03/19/2010  . NEPHROLITHIASIS, HX OF 09/21/2006  . OSTEOPENIA 12/17/2008  . OSTEOPOROSIS 01/22/2007  . SPINAL STENOSIS 12/17/2008  . Swelling of limb 03/19/2010  . URI 04/20/2007   Past Surgical History  Procedure Date  . Cholecystectomy   . Abdominal  hysterectomy   . Tubal ligation     reports that she has never smoked. She does not have any smokeless tobacco history on file. She reports that she does not drink alcohol or use illicit drugs. family history includes Cancer in her mother and sister; Diabetes in her brother; and Hypertension in her brother and sister. Allergies  Allergen Reactions  . Alendronate Sodium   . Hydrochlorothiazide W/Triamterene   . Norco (Hydrocodone-Acetaminophen) Nausea Only   Current Outpatient Prescriptions on File Prior to Visit  Medication Sig Dispense Refill  . acetaminophen (TYLENOL) 650 MG CR tablet Take 650 mg by mouth 2 (two) times daily.        Marland Kitchen acyclovir (ZOVIRAX) 5 % ointment Use as directed 5 times per day as needed       . amLODipine (NORVASC) 10 MG tablet Take 1 tablet (10 mg total) by mouth daily.  90 tablet  3  . aspirin 325 MG tablet Take 325 mg by mouth daily.        . polyethylene glycol powder (GLYCOLAX/MIRALAX) powder 17grams TWICE DAILY  119 g  11  . simvastatin (ZOCOR) 20 MG tablet Take 1 tablet (20 mg total) by mouth daily.  90 tablet  3  . valsartan (DIOVAN) 320 MG tablet Take 1 tablet (320 mg total) by mouth daily.  90 tablet  3   Review of Systems Review of Systems  Constitutional: Negative for diaphoresis and unexpected weight change.  HENT: Negative for drooling and tinnitus.   Eyes: Negative  for photophobia and visual disturbance.  Respiratory: Negative for choking and stridor.   Gastrointestinal: Negative for vomiting and blood in stool.  Genitourinary: Negative for hematuria and decreased urine volume.   Skin: Negative for color change and wound.  Neurological: Negative for tremors and numbness.  Psychiatric/Behavioral: Negative for decreased concentration. The patient is not hyperactive.       Objective:   Physical Exam BP 122/62  Pulse 80  Temp(Src) 97 F (36.1 C) (Oral)  Ht 5\' 7"  (1.702 m)  Wt 131 lb 2 oz (59.478 kg)  BMI 20.54 kg/m2  SpO2 97% Physical  Exam  VS noted, not ill appearing Constitutional: Pt appears well-developed and well-nourished.  HENT: Head: Normocephalic.  Right Ear: External ear normal.  Left Ear: External ear normal.  Eyes: Conjunctivae and EOM are normal. Pupils are equal, round, and reactive to light.  Neck: Normal range of motion. Neck supple.  Cardiovascular: Normal rate and regular rhythm.   Pulmonary/Chest: Effort normal and breath sounds normal.  Abd:  Soft, NT, non-distended, + BS Neurological: Pt is alert. No cranial nerve deficit.motor/sens/dtr intact; gait somewhat slow and careful  Skin: Skin is warm. No erythema.  Psychiatric: Pt behavior is normal. Thought content normal. not depressed appearing Bilat knees with crepitus, FROM, NT, no effusions    Assessment & Plan:

## 2011-04-25 NOTE — Patient Instructions (Signed)
Continue all other medications as before Please return in 6 mo with Lab testing done 3-5 days before  

## 2011-04-25 NOTE — Assessment & Plan Note (Signed)
.  stable overall by hx and exam, most recent data reviewed with pt, and pt to continue medical treatment as before  BP Readings from Last 3 Encounters:  04/25/11 122/62  10/20/10 120/62  07/15/10 110/68

## 2011-04-25 NOTE — Assessment & Plan Note (Signed)
Mild, for tylenol prn,  to f/u any worsening symptoms or concerns  

## 2011-04-25 NOTE — Assessment & Plan Note (Signed)
stable overall by hx and exam, most recent data reviewed with pt, and pt to continue medical treatment as before  Lab Results  Component Value Date   LDLCALC 76 03/19/2010      stable overall by hx and exam, most recent data reviewed with pt, and pt to continue medical treatment as before  Lab Results  Component Value Date   LDLCALC 76 03/19/2010

## 2011-04-25 NOTE — Assessment & Plan Note (Signed)
stable overall by hx and exam, most recent data reviewed with pt, and pt to continue medical treatment as before Lab Results  Component Value Date   WBC 4.8 09/13/2010   HGB 11.4* 09/13/2010   HCT 34.2* 09/13/2010   PLT 225.0 09/13/2010   GLUCOSE 61* 09/13/2010   CHOL 206* 09/13/2010   TRIG 70.0 09/13/2010   HDL 92.00 09/13/2010   LDLDIRECT 105.2 09/13/2010   LDLCALC 76 03/19/2010   ALT 16 09/13/2010   AST 26 09/13/2010   NA 142 09/13/2010   K 3.7 09/13/2010   CL 106 09/13/2010   CREATININE 1.1 09/13/2010   BUN 15 09/13/2010   CO2 26 09/13/2010   TSH 1.37 09/13/2010

## 2011-06-13 ENCOUNTER — Other Ambulatory Visit: Payer: Self-pay | Admitting: Internal Medicine

## 2011-10-12 ENCOUNTER — Other Ambulatory Visit (INDEPENDENT_AMBULATORY_CARE_PROVIDER_SITE_OTHER): Payer: Medicare Other

## 2011-10-12 DIAGNOSIS — Z79899 Other long term (current) drug therapy: Secondary | ICD-10-CM

## 2011-10-12 DIAGNOSIS — Z0000 Encounter for general adult medical examination without abnormal findings: Secondary | ICD-10-CM

## 2011-10-12 LAB — URINALYSIS, ROUTINE W REFLEX MICROSCOPIC
Bilirubin Urine: NEGATIVE
Ketones, ur: NEGATIVE
Total Protein, Urine: NEGATIVE
Urine Glucose: NEGATIVE
pH: 5.5 (ref 5.0–8.0)

## 2011-10-12 LAB — CBC WITH DIFFERENTIAL/PLATELET
Basophils Relative: 0.7 % (ref 0.0–3.0)
Eosinophils Absolute: 0 10*3/uL (ref 0.0–0.7)
Eosinophils Relative: 1 % (ref 0.0–5.0)
HCT: 36.4 % (ref 36.0–46.0)
Lymphs Abs: 1.8 10*3/uL (ref 0.7–4.0)
MCHC: 32.3 g/dL (ref 30.0–36.0)
MCV: 94.6 fl (ref 78.0–100.0)
Monocytes Absolute: 0.4 10*3/uL (ref 0.1–1.0)
RBC: 3.85 Mil/uL — ABNORMAL LOW (ref 3.87–5.11)
WBC: 5 10*3/uL (ref 4.5–10.5)

## 2011-10-12 LAB — LIPID PANEL
HDL: 87.2 mg/dL (ref >39.00)
Total CHOL/HDL Ratio: 3
VLDL: 14.2 mg/dL (ref 0.0–40.0)

## 2011-10-12 LAB — HEPATIC FUNCTION PANEL
AST: 25 U/L (ref 0–37)
Bilirubin, Direct: 0.1 mg/dL (ref 0.0–0.3)
Total Bilirubin: 0.5 mg/dL (ref 0.3–1.2)

## 2011-10-12 LAB — BASIC METABOLIC PANEL
BUN: 17 mg/dL (ref 6–23)
Creatinine, Ser: 1.1 mg/dL (ref 0.4–1.2)
GFR: 62.69 mL/min (ref >60.00)
Potassium: 4.2 mEq/L (ref 3.5–5.1)

## 2011-10-12 LAB — LDL CHOLESTEROL, DIRECT: Direct LDL: 123.5 mg/dL

## 2011-11-03 ENCOUNTER — Encounter: Payer: Self-pay | Admitting: Internal Medicine

## 2011-11-03 ENCOUNTER — Ambulatory Visit (INDEPENDENT_AMBULATORY_CARE_PROVIDER_SITE_OTHER): Payer: Medicare Other | Admitting: Internal Medicine

## 2011-11-03 VITALS — BP 122/70 | HR 80 | Temp 97.2°F | Ht 65.0 in | Wt 129.4 lb

## 2011-11-03 DIAGNOSIS — E785 Hyperlipidemia, unspecified: Secondary | ICD-10-CM

## 2011-11-03 DIAGNOSIS — I10 Essential (primary) hypertension: Secondary | ICD-10-CM

## 2011-11-03 DIAGNOSIS — F329 Major depressive disorder, single episode, unspecified: Secondary | ICD-10-CM

## 2011-11-03 MED ORDER — ACYCLOVIR 5 % EX OINT: TOPICAL_OINTMENT | CUTANEOUS | Status: DC

## 2011-11-03 MED ORDER — AMLODIPINE BESYLATE 10 MG PO TABS: 10.0000 mg | ORAL_TABLET | Freq: Every day | ORAL | Status: DC

## 2011-11-03 MED ORDER — IRBESARTAN 300 MG PO TABS: 300.0000 mg | ORAL_TABLET | Freq: Every day | ORAL | Status: DC

## 2011-11-03 MED ORDER — SIMVASTATIN 20 MG PO TABS: 20.0000 mg | ORAL_TABLET | Freq: Every day | ORAL | Status: DC

## 2011-11-03 NOTE — Progress Notes (Signed)
Subjective:    Patient ID: Lynn Howell, female    DOB: 11/05/1924, 78 y.o.   MRN: [id number]  HPI  Here for wellness and f/u;  Overall doing ok;  Pt denies CP, worsening SOB, DOE, wheezing, orthopnea, PND, worsening LE edema, palpitations, dizziness or syncope.  Pt denies neurological change such as new Headache, facial or extremity weakness.  Pt denies polydipsia, polyuria, or low sugar symptoms. Pt states overall good compliance with treatment and medications, good tolerability, and trying to follow lower cholesterol diet.  Pt denies worsening depressive symptoms, suicidal ideation or panic. No fever, wt loss, night sweats, loss of appetite, or other constitutional symptoms.  Pt states good ability with ADL's, low fall risk, home safety reviewed and adequate, no significant changes in hearing or vision, and occasionally active with exercise.  Admits to spotty compliaance with the zocor.  Still sees [facility] for the left thigh sarcoma, no change, and no treatment for now.   Denies worsening depressive symptoms, suicidal ideation, or panic. Past Medical History  Diagnosis Date  . Hypertension   . Hypercholesteremia   . Stroke   . Arthritis   . ANEMIA-IRON DEFICIENCY 08/01/2007  . ANKLE PAIN, LEFT 01/22/2007  . BACK PAIN, CHRONIC, INTERMITTENT 02/10/2010  . CERUMEN IMPACTION, RIGHT 04/20/2007  . COLONIC POLYPS, HX OF 09/21/2006  . COMMON MIGRAINE 09/21/2006  . CONSTIPATION, CHRONIC 09/21/2006  . CONTACT DERMATITIS 08/01/2008  . DEGENERATIVE JOINT DISEASE, KNEES, BILATERAL 03/19/2010  . DEPRESSION 08/01/2007  . HYPERLIPIDEMIA 08/01/2007  . HYPERTENSION 09/21/2006  . KNEE PAIN, BILATERAL 09/15/2008  . LEG PAIN, RIGHT 03/03/2008  . 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 LUMBAR RADICULOPATHY, RIGHT 03/19/2010  . NEPHROLITHIASIS, HX OF 09/21/2006  . OSTEOPENIA 12/17/2008  . OSTEOPOROSIS 01/22/2007  . SPINAL STENOSIS 12/17/2008  . Swelling of limb 03/19/2010  . URI 04/20/2007   Past Surgical History  Procedure Date  .  Cholecystectomy   . Abdominal hysterectomy   . Tubal ligation     reports that she has never smoked. She does not have any smokeless tobacco history on file. She reports that she does not drink alcohol or use illicit drugs. family history includes Cancer in her mother and sister; Diabetes in her brother; and Hypertension in her brother and sister. Allergies  Allergen Reactions  . Alendronate Sodium   . Hydrochlorothiazide W-Triamterene   . Norco (Hydrocodone-Acetaminophen) Nausea Only   Current Outpatient Prescriptions on File Prior to Visit  Medication Sig Dispense Refill  . acetaminophen (TYLENOL) 650 MG CR tablet Take 650 mg by mouth 2 (two) times daily.        Marland Kitchen acyclovir (ZOVIRAX) 5 % ointment Use as directed 5 times per day as needed       . amLODipine (NORVASC) 10 MG tablet Take 1 tablet (10 mg total) by mouth daily.  90 tablet  3  . aspirin 325 MG tablet Take 325 mg by mouth daily.        . polyethylene glycol powder (GLYCOLAX/MIRALAX) powder TAKE 17 GRAMS TWICE DAILY  527 g  6  . simvastatin (ZOCOR) 20 MG tablet Take 1 tablet (20 mg total) by mouth daily.  90 tablet  3  . valsartan (DIOVAN) 320 MG tablet Take 1 tablet (320 mg total) by mouth daily.  90 tablet  3   Review of Systems  Constitutional: Negative for diaphoresis and unexpected weight change.  HENT: Negative for tinnitus.   Eyes: Negative for photophobia and visual disturbance.  Respiratory: Negative for choking  and stridor.   Gastrointestinal: Negative for vomiting and blood in stool.  Genitourinary: Negative for hematuria and decreased urine volume.  Musculoskeletal: Negative for acute joint swelling  Skin: Negative for color change and wound.  Neurological: Negative for tremors and numbness.  Psychiatric/Behavioral: Negative for decreased concentration. The patient is not hyperactive.       Objective:   Physical Exam BP 122/70  Pulse 80  Temp 97.2 F (36.2 C) (Oral)  Ht 5\' 5"  (1.651 m)  Wt 129 lb 6 oz  (58.684 kg)  BMI 21.53 kg/m2  SpO2 98% Physical Exam  VS noted Constitutional: Pt appears well-developed and well-nourished.  HENT: Head: Normocephalic.  Right Ear: External ear normal.  Left Ear: External ear normal.  Eyes: Conjunctivae and EOM are normal. Pupils are equal, round, and reactive to light.  Neck: Normal range of motion. Neck supple.  Cardiovascular: Normal rate and regular rhythm.   Pulmonary/Chest: Effort normal and breath sounds normal.  Abd:  Soft, NT, non-distended, + BS Neurological: Pt is alert. Not confused  Skin: Skin is warm. No erythema.  Psychiatric: Pt behavior is normal. Thought content normal. not depressed affect     Assessment & Plan:

## 2011-11-03 NOTE — Assessment & Plan Note (Addendum)
diovan too expensive, to change to avapro 300 qd, o/w stable overall by hx and exam, most recent data reviewed with pt, and pt to continue medical treatment as before BP Readings from Last 3 Encounters:  11/03/11 122/70  04/25/11 122/62  10/20/10 120/62

## 2011-11-03 NOTE — Patient Instructions (Addendum)
OK to stop the diovan Please start the generic for Avapro 300 mg per day - which should be less expensive Continue all other medications as before All of your medications were refilled today Please take your cholesterol medication at least every other day (every day would be better) No further lab work or testing needed today Please call if it seems you might need physical therapy for the leg weakness Please return if you change your mind about the flu shot You are otherwise up to date on prevention measures today Please return in 6 months, or sooner if needed Please remember to sign up for My Chart at your earliest convenience, as this will be important to you in the future with finding out test results.

## 2011-11-05 ENCOUNTER — Encounter: Payer: Self-pay | Admitting: Internal Medicine

## 2011-11-05 NOTE — Assessment & Plan Note (Signed)
stable overall by hx and exam, most recent data reviewed with pt, and pt to continue medical treatment as before  Lab Results  Component Value Date   LDLCALC 76 03/19/2010    

## 2011-11-05 NOTE — Assessment & Plan Note (Signed)
stable overall by hx and exam, most recent data reviewed with pt, and pt to continue medical treatment as before Lab Results  Component Value Date   WBC 5.0 10/12/2011   HGB 11.8* 10/12/2011   HCT 36.4 10/12/2011   PLT 220.0 10/12/2011   GLUCOSE 84 10/12/2011   CHOL 233* 10/12/2011   TRIG 71.0 10/12/2011   HDL 87.20 10/12/2011   LDLDIRECT 123.5 10/12/2011   LDLCALC 76 03/19/2010   ALT 13 10/12/2011   AST 25 10/12/2011   NA 142 10/12/2011   K 4.2 10/12/2011   CL 107 10/12/2011   CREATININE 1.1 10/12/2011   BUN 17 10/12/2011   CO2 26 10/12/2011   TSH 2.32 10/12/2011

## 2012-01-16 ENCOUNTER — Telehealth: Payer: Self-pay

## 2012-01-16 ENCOUNTER — Other Ambulatory Visit: Payer: Self-pay | Admitting: Internal Medicine

## 2012-01-16 NOTE — Telephone Encounter (Signed)
Lynn Howell fax to request to change to Diovan HCT.   Patient has been on Diovan 320 mg but expensive and no generic available.  There is a generic for Diovan HCT 320/12.5 and 320/25.  Please advise if ok to change and provide new rx with strength, directions and quantity.

## 2012-01-18 NOTE — Telephone Encounter (Signed)
Already addressed, and diovan changed to generic avapro last wk

## 2012-02-02 ENCOUNTER — Telehealth: Payer: Self-pay | Admitting: Internal Medicine

## 2012-02-02 DIAGNOSIS — I10 Essential (primary) hypertension: Secondary | ICD-10-CM

## 2012-02-02 NOTE — Telephone Encounter (Signed)
This is not really the same as avapro 300  I could change to losartan 100 - can she check if this is covered?

## 2012-02-02 NOTE — Telephone Encounter (Signed)
Called the patient and she never started Avapro 300 mg as prescribed at last ov.  She is taking amlodipine 10 mg and Diovan 320

## 2012-02-02 NOTE — Telephone Encounter (Signed)
The patient called back to inform she is confused as to what to take and what is best for her.  Please advise.

## 2012-02-02 NOTE — Telephone Encounter (Signed)
Requesting a refill on Diovan.  [facility]

## 2012-02-02 NOTE — Telephone Encounter (Signed)
Pharmacy is stating the patient would like to change to generic diovan/HCT 320-12.5 due to insurance.  Please advise

## 2012-02-02 NOTE — Telephone Encounter (Signed)
Should cont avapro 300 and amlod 10 - ok for refills if needed  Both are generic, and i think should be ok with her insurance  No need to take losartan or Diovan further

## 2012-02-03 MED ORDER — IRBESARTAN 300 MG PO TABS: 300.0000 mg | ORAL_TABLET | Freq: Every day | ORAL | Status: DC

## 2012-02-03 NOTE — Telephone Encounter (Signed)
Left message for pt to callback office.  

## 2012-02-03 NOTE — Telephone Encounter (Signed)
Pt informed of MD's advisement to take Avapro and Amlodipine only. Rx sent to [facility] for Avapro. Pt verbalized understanding.

## 2012-02-14 ENCOUNTER — Telehealth: Payer: Self-pay | Admitting: Internal Medicine

## 2012-02-14 ENCOUNTER — Ambulatory Visit (INDEPENDENT_AMBULATORY_CARE_PROVIDER_SITE_OTHER): Payer: Medicare Other | Admitting: Internal Medicine

## 2012-02-14 ENCOUNTER — Ambulatory Visit (INDEPENDENT_AMBULATORY_CARE_PROVIDER_SITE_OTHER)
Admission: RE | Admit: 2012-02-14 | Discharge: 2012-02-14 | Disposition: A | Discharge status: Home / Self Care | Payer: Medicare Other | Source: Ambulatory Visit | Attending: Internal Medicine | Admitting: Internal Medicine

## 2012-02-14 DIAGNOSIS — M79602 Pain in left arm: Secondary | ICD-10-CM

## 2012-02-14 DIAGNOSIS — M79609 Pain in unspecified limb: Secondary | ICD-10-CM

## 2012-02-14 NOTE — Progress Notes (Signed)
Subjective:    Patient ID: Lynn Howell, female    DOB: 11/05/1924, 79 y.o.   MRN: [id number]  HPI Lynn Howell presents today for a 2 week h/o pain in the left arm with radiation to the shoulder, neck and left breast. It is a sharp but does not hurt all the time. No SOB, no sweats, no chest pressure. She has a liposarcoma of the left thigh and is worried about spread of cancer. No weakness in the arm, no loss of grip strength. She does get intermittent paresthesia of the fingers. She has not palpated any lumps in the breast. She is gaining, not loosing weight. No history of heart disease  Past Medical History  Diagnosis Date  . Hypertension   . Hypercholesteremia   . Stroke   . Arthritis   . ANEMIA-IRON DEFICIENCY 08/01/2007  . ANKLE PAIN, LEFT 01/22/2007  . BACK PAIN, CHRONIC, INTERMITTENT 02/10/2010  . CERUMEN IMPACTION, RIGHT 04/20/2007  . COLONIC POLYPS, HX OF 09/21/2006  . COMMON MIGRAINE 09/21/2006  . CONSTIPATION, CHRONIC 09/21/2006  . CONTACT DERMATITIS 08/01/2008  . DEGENERATIVE JOINT DISEASE, KNEES, BILATERAL 03/19/2010  . DEPRESSION 08/01/2007  . HYPERLIPIDEMIA 08/01/2007  . HYPERTENSION 09/21/2006  . KNEE PAIN, BILATERAL 09/15/2008  . LEG PAIN, RIGHT 03/03/2008  . 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 LUMBAR RADICULOPATHY, RIGHT 03/19/2010  . NEPHROLITHIASIS, HX OF 09/21/2006  . OSTEOPENIA 12/17/2008  . OSTEOPOROSIS 01/22/2007  . SPINAL STENOSIS 12/17/2008  . Swelling of limb 03/19/2010  . URI 04/20/2007   Past Surgical History  Procedure Date  . Cholecystectomy   . Abdominal hysterectomy   . Tubal ligation    Family History  Problem Relation Age of Onset  . Cancer Mother   . Cancer Sister   . Hypertension Sister   . Diabetes Brother   . Hypertension Brother    History   Social History  . Marital Status: Widowed    Spouse Name: N/A    Number of Children: 4  . Years of Education: N/A   Occupational History  . Retired    Social History Main Topics  . Smoking status: Never  Smoker   . Smokeless tobacco: Not on file  . Alcohol Use: No  . Drug Use: No  . Sexually Active: Not on file   Other Topics Concern  . Not on file   Social History Narrative  . No narrative on file    Current Outpatient Prescriptions on File Prior to Visit  Medication Sig Dispense Refill  . acetaminophen (TYLENOL) 650 MG CR tablet Take 650 mg by mouth 2 (two) times daily.        Marland Kitchen amLODipine (NORVASC) 10 MG tablet Take 1 tablet (10 mg total) by mouth daily.  90 tablet  3  . aspirin 325 MG tablet Take 325 mg by mouth daily.        . irbesartan (AVAPRO) 300 MG tablet Take 1 tablet (300 mg total) by mouth daily.  90 tablet  3  . simvastatin (ZOCOR) 20 MG tablet Take 1 tablet (20 mg total) by mouth daily.  90 tablet  3      Review of Systems System review is negative for any constitutional, cardiac, pulmonary, GI or neuro symptoms or complaints other than as described in the HPI.     Objective:   Physical Exam T 98.7, BP 148/90  HR 99 Wt Readings from Last 3 Encounters:  11/03/11 129 lb 6 oz (58.684 kg)  04/25/11 131 lb 2  oz (59.478 kg)  10/20/10 129 lb (58.514 kg)    Gen'l- very thin AA woman in no distress HEENT - normal Neck - decreased ROM, very limited extension, pain with flexion and rotation NOdes - negative cervical or supraclavicular Cor- RRR Pulm - CTAP Neuro - grip strength left hand normal, strength left UE normal. DTRs 2+ left radial, bicep and tricep tendons. Sensation - normal to light touch. Patient has rigidity in the UE, cog-wheeling, slow gait.  12 lead EKG normal          Assessment & Plan:  1. Pain in the left arm, shoulder, neck and left chest is very suggestive of a neck related problem. EKG is normal and the symptoms are not consistent with heart related pain. It is VERY unlikely that this is a spread of your cancer.   Plan - xrays of the neck today  Start taking Aleve 1 or 2 tablets twice a day  A rub to the left neck,e.g. Icy-hot or  Ben-Gay, etc  Addendum: C-spine series: CERVICAL SPINE - COMPLETE 4+ VIEW  Comparison: Cervical MRI dated 04/30/2006  Findings: There is degenerative disc disease at C4-5, C5-6, and to  a lesser degree at C6-7. Disc space narrowing. Diffuse slight  foraminal stenosis from C3-4 through C6-7 bilaterally. No facet  arthritis.  No prevertebral soft tissue swelling. No subluxation.  The prior cervical MRI demonstrated severe cervical spinal stenosis  at C3-4 and C4-5 and moderate cervical spinal stenosis at C5-6.  IMPRESSION:  Multilevel degenerative disc disease. No acute abnormality.  Previously demonstrated severe cervical spinal stenosis.    2. Stiffness, rigidity and and slight tremor along with gait abnormality suggestive of Parkinson like disease.  Plan - per Dr. Jonny Ruiz in regard to further evaluation.

## 2012-02-14 NOTE — Telephone Encounter (Signed)
Attempted to call the patient and got a vm.

## 2012-02-14 NOTE — Telephone Encounter (Signed)
Attempted to reach patient for triage.  Unable to reach patient at number given; message left on identified voicemail to contact office for assistance.  krs/can

## 2012-02-14 NOTE — Patient Instructions (Addendum)
Pain in the left arm, shoulder, neck and left chest is very suggestive of a neck related problem. EKG is normal and the symptoms are not consistent with heart related pain. It is VERY unlikely that this is a spread of your cancer.   Plan - xrays of the neck today  Start taking Aleve 1 or 2 tablets twice a day  A rub to the left neck,e.g. Icy-hot or Ben-Gay, etc  You will be notified as to the results of the x-ray.

## 2012-03-07 ENCOUNTER — Encounter: Payer: Self-pay | Admitting: Internal Medicine

## 2012-03-07 ENCOUNTER — Ambulatory Visit (INDEPENDENT_AMBULATORY_CARE_PROVIDER_SITE_OTHER): Payer: Medicare Other | Admitting: Internal Medicine

## 2012-03-07 VITALS — BP 130/70 | HR 88 | Temp 98.1°F | Ht 64.0 in | Wt 128.2 lb

## 2012-03-07 DIAGNOSIS — M5412 Radiculopathy, cervical region: Secondary | ICD-10-CM

## 2012-03-07 DIAGNOSIS — I10 Essential (primary) hypertension: Secondary | ICD-10-CM

## 2012-03-07 HISTORY — DX: Radiculopathy, cervical region: M54.12

## 2012-03-07 MED ORDER — GABAPENTIN 100 MG PO CAPS: 100.0000 mg | ORAL_CAPSULE | Freq: Three times a day (TID) | ORAL | Status: DC

## 2012-03-07 NOTE — Patient Instructions (Addendum)
Please take all new medication as prescribed - the gabapentin at 100 mg three times per day Please continue all other medications as before, and refills have been done if requested. Please call next Monday if you would like the gabapentin increased for better pain control You will be contacted regarding the referral for: MRI for the neck, and the Neurosurgury to see what the best treatment might be Please remember to sign up for My Chart if you have not done so, as this will be important to you in the future with finding out test results, communicating by private email, and scheduling acute appointments online when needed.

## 2012-03-07 NOTE — Assessment & Plan Note (Signed)
stable overall by history and exam, recent data reviewed with pt, and pt to continue medical treatment as before,  to f/u any worsening symptoms or concerns Lab Results  Component Value Date   WBC 5.0 10/12/2011   HGB 11.8* 10/12/2011   HCT 36.4 10/12/2011   PLT 220.0 10/12/2011   GLUCOSE 84 10/12/2011   CHOL 233* 10/12/2011   TRIG 71.0 10/12/2011   HDL 87.20 10/12/2011   LDLDIRECT 123.5 10/12/2011   LDLCALC 76 03/19/2010   ALT 13 10/12/2011   AST 25 10/12/2011   NA 142 10/12/2011   K 4.2 10/12/2011   CL 107 10/12/2011   CREATININE 1.1 10/12/2011   BUN 17 10/12/2011   CO2 26 10/12/2011   TSH 2.32 10/12/2011

## 2012-03-07 NOTE — Progress Notes (Signed)
Subjective:    Patient ID: Lynn Howell, female    DOB: 11/05/1924, 79 y.o.   MRN: [id number]  HPI Here with c/o persistent and now worsening moderate left neck pain with radiation below the elbow, with numbness of left thumb and index finger, and distal weakness assoc with loss of some grip strength and dropping objects for the last 2-3 wks.  Recent c-spine films with deg changes noted.  No HA, left shoulder pain/elbow pain, and Pt denies bowel or bladder change, fever, wt loss,  worsening LE pain/numbness/weakness, gait change or falls.  Pt denies chest pain, increased sob or doe, wheezing, orthopnea, PND, increased LE swelling, palpitations, dizziness or syncope.  Pt denies new neurological symptoms such as new headache, or facial or extremity weakness or numbness except for right hand numbness occasional as well, without RUE pain or weakness.   Pt denies polydipsia, polyuria. Denies worsening depressive symptoms, suicidal ideation, or panic Past Medical History  Diagnosis Date  . Hypertension   . Hypercholesteremia   . Stroke   . Arthritis   . ANEMIA-IRON DEFICIENCY 08/01/2007  . ANKLE PAIN, LEFT 01/22/2007  . BACK PAIN, CHRONIC, INTERMITTENT 02/10/2010  . CERUMEN IMPACTION, RIGHT 04/20/2007  . COLONIC POLYPS, HX OF 09/21/2006  . COMMON MIGRAINE 09/21/2006  . CONSTIPATION, CHRONIC 09/21/2006  . CONTACT DERMATITIS 08/01/2008  . DEGENERATIVE JOINT DISEASE, KNEES, BILATERAL 03/19/2010  . DEPRESSION 08/01/2007  . HYPERLIPIDEMIA 08/01/2007  . HYPERTENSION 09/21/2006  . KNEE PAIN, BILATERAL 09/15/2008  . LEG PAIN, RIGHT 03/03/2008  . 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 LUMBAR RADICULOPATHY, RIGHT 03/19/2010  . NEPHROLITHIASIS, HX OF 09/21/2006  . OSTEOPENIA 12/17/2008  . OSTEOPOROSIS 01/22/2007  . SPINAL STENOSIS 12/17/2008  . Swelling of limb 03/19/2010  . URI 04/20/2007  . Left cervical radiculopathy 03/07/2012   Past Surgical History  Procedure Laterality Date  . Cholecystectomy    . Abdominal  hysterectomy    . Tubal ligation      reports that she has never smoked. She does not have any smokeless tobacco history on file. She reports that she does not drink alcohol or use illicit drugs. family history includes Cancer in her mother and sister; Diabetes in her brother; and Hypertension in her brother and sister. Allergies  Allergen Reactions  . Alendronate Sodium   . Hydrochlorothiazide W-Triamterene   . Norco (Hydrocodone-Acetaminophen) Nausea Only   Review of Systems  Constitutional: Negative for unexpected weight change, or unusual diaphoresis  HENT: Negative for tinnitus.   Eyes: Negative for photophobia and visual disturbance.  Respiratory: Negative for choking and stridor.   Gastrointestinal: Negative for vomiting and blood in stool.  Genitourinary: Negative for hematuria and decreased urine volume.  Musculoskeletal: Negative for acute joint swelling Skin: Negative for color change and wound.  Neurological: Negative for tremors and numbness other than noted  Psychiatric/Behavioral: Negative for decreased concentration or  hyperactivity.       Objective:   Physical Exam BP 130/70  Pulse 88  Temp(Src) 98.1 F (36.7 C) (Oral)  Ht 5\' 4"  (1.626 m)  Wt 128 lb 4 oz (58.174 kg)  BMI 22 kg/m2  SpO2 97% VS noted, not ill appaering but uncomfortable Constitutional: Pt appears well-developed and well-nourished.  HENT: Head: NCAT.  Right Ear: External ear normal.  Left Ear: External ear normal.  Eyes: Conjunctivae and EOM are normal. Pupils are equal, round, and reactive to light.  Neck: Normal range of motion. Neck supple. Has mild midline tenderness to c4-6 level without swelling,  red, or rash Trapezoid nontender Left shoulder FROM  Cardiovascular: Normal rate and regular rhythm.   Pulmonary/Chest: Effort normal and breath sounds normal.  Neurological: Pt is alert. Not confused , sens/dtr intact, motor 4+/5 distal weakness/grip strength Skin: Skin is warm. No  erythema. No rash Psychiatric: Pt behavior is normal. Thought content normal. not depressed affect    Assessment & Plan:

## 2012-03-07 NOTE — Assessment & Plan Note (Signed)
Moderate pain overall but persistent with worsening neuro change, for MRI c-spine, refer NS, and gabapentin 100 tid trial

## 2012-03-07 NOTE — Assessment & Plan Note (Signed)
stable overall by history and exam, recent data reviewed with pt, and pt to continue medical treatment as before,  to f/u any worsening symptoms or concerns BP Readings from Last 3 Encounters:  03/07/12 130/70  11/03/11 122/70  04/25/11 122/62

## 2012-03-08 ENCOUNTER — Ambulatory Visit: Payer: Medicare Other | Admitting: Internal Medicine

## 2012-03-15 ENCOUNTER — Ambulatory Visit
Admission: RE | Admit: 2012-03-15 | Discharge: 2012-03-15 | Disposition: A | Discharge status: Home / Self Care | Payer: Medicare Other | Source: Ambulatory Visit | Attending: Internal Medicine | Admitting: Internal Medicine

## 2012-03-15 ENCOUNTER — Encounter: Payer: Self-pay | Admitting: Internal Medicine

## 2012-04-23 ENCOUNTER — Encounter: Payer: Self-pay | Admitting: Internal Medicine

## 2012-04-23 ENCOUNTER — Other Ambulatory Visit (INDEPENDENT_AMBULATORY_CARE_PROVIDER_SITE_OTHER): Payer: Medicare Other

## 2012-04-23 ENCOUNTER — Ambulatory Visit (INDEPENDENT_AMBULATORY_CARE_PROVIDER_SITE_OTHER): Payer: Medicare Other | Admitting: Internal Medicine

## 2012-04-23 VITALS — BP 142/70 | HR 87 | Temp 97.8°F | Ht 65.0 in | Wt 133.2 lb

## 2012-04-23 DIAGNOSIS — Z0000 Encounter for general adult medical examination without abnormal findings: Secondary | ICD-10-CM

## 2012-04-23 DIAGNOSIS — E785 Hyperlipidemia, unspecified: Secondary | ICD-10-CM

## 2012-04-23 DIAGNOSIS — R609 Edema, unspecified: Secondary | ICD-10-CM

## 2012-04-23 DIAGNOSIS — I10 Essential (primary) hypertension: Secondary | ICD-10-CM

## 2012-04-23 LAB — BASIC METABOLIC PANEL
CO2: 26 mEq/L (ref 19–32)
Calcium: 9.4 mg/dL (ref 8.4–10.5)
Sodium: 141 mEq/L (ref 135–145)

## 2012-04-23 MED ORDER — SIMVASTATIN 20 MG PO TABS: 20.0000 mg | ORAL_TABLET | Freq: Every day | ORAL | Status: DC

## 2012-04-23 NOTE — Patient Instructions (Addendum)
Your simvastatin for cholesterol was refilled today Please continue your efforts at being more active, low cholesterol diet, and weight control. OK to not take the gabapentin as you have already stopped Please keep your appointments with your specialists as you have planned  - Dr Gerlene Fee By your history, it seems the swelling in the ankles and feet may be related to the nabumetone (the last medication per Dr Gerlene Fee); you may with to mention this at your visit next wk with him Please go to the LAB in the Basement (turn left off the elevator) for the tests to be done today - to check for kidney slowing due to the nabumetome You will be contacted by phone if any changes need to be made immediately.  Otherwise, you will receive a letter about your results with an explanation Please remember to sign up for My Chart if you have not done so, as this will be important to you in the future with finding out test results, communicating by private email, and scheduling acute appointments online when needed. Please return in 6 months, or sooner if needed, with Lab testing done 3-5 days before

## 2012-04-23 NOTE — Progress Notes (Signed)
Subjective:    Patient ID: Lynn Howell, female    DOB: 11/05/1924, 79 y.o.   MRN: [id number]  HPI  .Here to f/u, Here to f/u; overall doing ok,  Pt denies chest pain, increased sob or doe, wheezing, orthopnea, PND, palpitations, dizziness or syncope, except for worsening ankle edema bilat since starting recent nsaid.  Pt denies polydipsia, polyuria, or low sugar symptoms such as weakness or confusion improved with po intake.  Pt denies new neurological symptoms such as new headache, or facial or extremity weakness or numbness.   Pt states overall good compliance with meds, has been trying to follow lower cholesterol diet, with wt overall stable,  but little exercise however    Not taking simvastatin currently , ran out > 1 mo.  Gabapentin did not seem to help, did have some improvement with prednisone, now on nabumetome per NS/Dr Kritzer, with f/u soon.  Last labs done about 3 wks ago per Dr Kirtland Bouchard, the same day started the NSAID, states pain overall resolved.  Denies worsening depressive symptoms, suicidal ideation, or panic; has ongoing stress related to grandchildren. Past Medical History  Diagnosis Date  . Hypertension   . Hypercholesteremia   . Stroke   . Arthritis   . ANEMIA-IRON DEFICIENCY 08/01/2007  . ANKLE PAIN, LEFT 01/22/2007  . BACK PAIN, CHRONIC, INTERMITTENT 02/10/2010  . CERUMEN IMPACTION, RIGHT 04/20/2007  . COLONIC POLYPS, HX OF 09/21/2006  . COMMON MIGRAINE 09/21/2006  . CONSTIPATION, CHRONIC 09/21/2006  . CONTACT DERMATITIS 08/01/2008  . DEGENERATIVE JOINT DISEASE, KNEES, BILATERAL 03/19/2010  . DEPRESSION 08/01/2007  . HYPERLIPIDEMIA 08/01/2007  . HYPERTENSION 09/21/2006  . KNEE PAIN, BILATERAL 09/15/2008  . LEG PAIN, RIGHT 03/03/2008  . 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 LUMBAR RADICULOPATHY, RIGHT 03/19/2010  . NEPHROLITHIASIS, HX OF 09/21/2006  . OSTEOPENIA 12/17/2008  . OSTEOPOROSIS 01/22/2007  . SPINAL STENOSIS 12/17/2008  . Swelling of limb 03/19/2010  . URI 04/20/2007  . Left  cervical radiculopathy 03/07/2012   Past Surgical History  Procedure Laterality Date  . Cholecystectomy    . Abdominal hysterectomy    . Tubal ligation      reports that she has never smoked. She does not have any smokeless tobacco history on file. She reports that she does not drink alcohol or use illicit drugs. family history includes Cancer in her mother and sister; Diabetes in her brother; and Hypertension in her brother and sister. Allergies  Allergen Reactions  . Alendronate Sodium   . Hydrochlorothiazide W-Triamterene   . Norco (Hydrocodone-Acetaminophen) Nausea Only   Current Outpatient Prescriptions on File Prior to Visit  Medication Sig Dispense Refill  . acetaminophen (TYLENOL) 650 MG CR tablet Take 650 mg by mouth 2 (two) times daily.        Marland Kitchen amLODipine (NORVASC) 10 MG tablet Take 1 tablet (10 mg total) by mouth daily.  90 tablet  3  . aspirin 325 MG tablet Take 325 mg by mouth daily.        . irbesartan (AVAPRO) 300 MG tablet Take 1 tablet (300 mg total) by mouth daily.  90 tablet  3   No current facility-administered medications on file prior to visit.   Review of Systems  Constitutional: Negative for unexpected weight change, or unusual diaphoresis  HENT: Negative for tinnitus.   Eyes: Negative for photophobia and visual disturbance.  Respiratory: Negative for choking and stridor.   Gastrointestinal: Negative for vomiting and blood in stool.  Genitourinary: Negative for hematuria and decreased urine  volume.  Musculoskeletal: Negative for acute joint swelling Skin: Negative for color change and wound.  Neurological: Negative for tremors and numbness other than noted  Psychiatric/Behavioral: Negative for decreased concentration or  hyperactivity.       Objective:   Physical Exam BP 142/70  Pulse 87  Temp(Src) 97.8 F (36.6 C) (Oral)  Ht 5\' 5"  (1.651 m)  Wt 133 lb 4 oz (60.442 kg)  BMI 22.17 kg/m2  SpO2 98% VS noted,  Constitutional: Pt appears  well-developed and well-nourished.  HENT: Head: NCAT.  Right Ear: External ear normal.  Left Ear: External ear normal.  Eyes: Conjunctivae and EOM are normal. Pupils are equal, round, and reactive to light.  Neck: Normal range of motion. Neck supple.  Cardiovascular: Normal rate and regular rhythm.   Pulmonary/Chest: Effort normal and breath sounds normal.  Neurological: Pt is alert. Not confused  Skin: Skin is warm. No erythema. with bilat trace to 1+ ankle edema Psychiatric: Pt behavior is normal. Thought content normal.     Assessment & Plan:

## 2012-04-23 NOTE — Assessment & Plan Note (Signed)
stable overall by history and exam, recent data reviewed with pt, and pt to continue medical treatment as before,  to f/u any worsening symptoms or concerns Lab Results  Component Value Date   LDLCALC 76 03/19/2010

## 2012-04-23 NOTE — Assessment & Plan Note (Signed)
stable overall by history and exam, recent data reviewed with pt, and pt to continue medical treatment as before,  to f/u any worsening symptoms or concerns BP Readings from Last 3 Encounters:  04/23/12 142/70  03/07/12 130/70  11/03/11 122/70

## 2012-04-23 NOTE — Assessment & Plan Note (Signed)
I suspect related to nsaid use recent, BP ok, pain improved, to check bmet to r/o renal insufficiency

## 2012-05-03 ENCOUNTER — Ambulatory Visit: Payer: Medicare Other | Admitting: Internal Medicine

## 2012-06-20 ENCOUNTER — Ambulatory Visit (INDEPENDENT_AMBULATORY_CARE_PROVIDER_SITE_OTHER): Payer: Medicare Other | Admitting: Internal Medicine

## 2012-06-20 ENCOUNTER — Encounter: Payer: Self-pay | Admitting: Internal Medicine

## 2012-06-20 VITALS — BP 120/70 | HR 83 | Temp 97.9°F | Ht 63.0 in | Wt 132.0 lb

## 2012-06-20 DIAGNOSIS — M1710 Unilateral primary osteoarthritis, unspecified knee: Secondary | ICD-10-CM

## 2012-06-20 DIAGNOSIS — I10 Essential (primary) hypertension: Secondary | ICD-10-CM

## 2012-06-20 DIAGNOSIS — R609 Edema, unspecified: Secondary | ICD-10-CM

## 2012-06-20 MED ORDER — DICLOFENAC SODIUM 1 % TD GEL: 4.0000 g | Freq: Four times a day (QID) | TRANSDERMAL | Status: DC

## 2012-06-20 MED ORDER — IRBESARTAN-HYDROCHLOROTHIAZIDE 150-12.5 MG PO TABS: 1.0000 | ORAL_TABLET | Freq: Every day | ORAL | Status: DC

## 2012-06-20 NOTE — Patient Instructions (Signed)
OK to stop the Avapro (irbesartan) 300 mg when your current bottle is done After that, please start Avalide (irbesartan-HCT) 150-12.5 mg, which has the mild fluid pill in it Please take all new medication as prescribed - the topical pain medication for the knees and ankles as needed Please continue all other medications as before Please have the pharmacy call with any other refills you may need.  Please remember to sign up for My Chart if you have not done so, as this will be important to you in the future with finding out test results, communicating by private email, and scheduling acute appointments online when needed.

## 2012-06-20 NOTE — Assessment & Plan Note (Signed)
To add HCT in the form of avalide as above

## 2012-06-20 NOTE — Progress Notes (Signed)
Subjective:    Patient ID: Lynn Howell, female    DOB: 11/05/1924, 79 y.o.   MRN: [id number]  HPI  Here with c/o persistent LE swelling not improved after stopping nsaid last visit, left > right, with bilat knee pain left ? Right, Pt denies chest pain, increased sob or doe, wheezing, orthopnea, PND, palpitations, dizziness or syncope. Pt denies new neurological symptoms such as new headache, or facial or extremity weakness or numbness   Pt denies polydipsia, polyuria.  Pt denies fever, wt loss, night sweats, loss of appetite, or other constitutional symptoms . Also with pain to knees, ankles. Past Medical History  Diagnosis Date  . Hypertension   . Hypercholesteremia   . Stroke   . Arthritis   . ANEMIA-IRON DEFICIENCY 08/01/2007  . ANKLE PAIN, LEFT 01/22/2007  . BACK PAIN, CHRONIC, INTERMITTENT 02/10/2010  . CERUMEN IMPACTION, RIGHT 04/20/2007  . COLONIC POLYPS, HX OF 09/21/2006  . COMMON MIGRAINE 09/21/2006  . CONSTIPATION, CHRONIC 09/21/2006  . CONTACT DERMATITIS 08/01/2008  . DEGENERATIVE JOINT DISEASE, KNEES, BILATERAL 03/19/2010  . DEPRESSION 08/01/2007  . HYPERLIPIDEMIA 08/01/2007  . HYPERTENSION 09/21/2006  . KNEE PAIN, BILATERAL 09/15/2008  . LEG PAIN, RIGHT 03/03/2008  . 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 LUMBAR RADICULOPATHY, RIGHT 03/19/2010  . NEPHROLITHIASIS, HX OF 09/21/2006  . OSTEOPENIA 12/17/2008  . OSTEOPOROSIS 01/22/2007  . SPINAL STENOSIS 12/17/2008  . Swelling of limb 03/19/2010  . URI 04/20/2007  . Left cervical radiculopathy 03/07/2012   Past Surgical History  Procedure Laterality Date  . Cholecystectomy    . Abdominal hysterectomy    . Tubal ligation      reports that she has never smoked. She does not have any smokeless tobacco history on file. She reports that she does not drink alcohol or use illicit drugs. family history includes Cancer in her mother and sister; Diabetes in her brother; and Hypertension in her brother and sister. Allergies  Allergen Reactions  .  Alendronate Sodium   . Hydrochlorothiazide W-Triamterene   . Norco (Hydrocodone-Acetaminophen) Nausea Only   Current Outpatient Prescriptions on File Prior to Visit  Medication Sig Dispense Refill  . acetaminophen (TYLENOL) 650 MG CR tablet Take 650 mg by mouth 2 (two) times daily.        Marland Kitchen amLODipine (NORVASC) 10 MG tablet Take 1 tablet (10 mg total) by mouth daily.  90 tablet  3  . aspirin 325 MG tablet Take 325 mg by mouth daily.        . simvastatin (ZOCOR) 20 MG tablet Take 1 tablet (20 mg total) by mouth daily.  90 tablet  3   No current facility-administered medications on file prior to visit.   Review of Systems  Constitutional: Negative for unexpected weight change, or unusual diaphoresis  HENT: Negative for tinnitus.   Eyes: Negative for photophobia and visual disturbance.  Respiratory: Negative for choking and stridor.   Gastrointestinal: Negative for vomiting and blood in stool.  Genitourinary: Negative for hematuria and decreased urine volume.  Musculoskeletal: Negative for acute joint swelling Skin: Negative for color change and wound.  Neurological: Negative for tremors and numbness other than noted  Psychiatric/Behavioral: Negative for decreased concentration or  hyperactivity.       Objective:   Physical Exam BP 120/70  Pulse 83  Temp(Src) 97.9 F (36.6 C) (Oral)  Ht 5\' 3"  (1.6 m)  Wt 132 lb (59.875 kg)  BMI 23.39 kg/m2  SpO2 98% VS noted, not ill appearing Constitutional: Pt appears well-developed  and well-nourished.  HENT: Head: NCAT.  Right Ear: External ear normal.  Left Ear: External ear normal.  Eyes: Conjunctivae and EOM are normal. Pupils are equal, round, and reactive to light.  Neck: Normal range of motion. Neck supple.  Cardiovascular: Normal rate and regular rhythm.   Pulmonary/Chest: Effort normal and breath sounds normal.  Abd:  Soft, NT, non-distended, + BS Neurological: Pt is alert. Not confused  Skin: Skin is warm. No erythema. with LLE  trace-1+ edema,  Left knee with trace effusion, NT Psychiatric: Pt behavior is normal. Thought content normal.     Assessment & Plan:

## 2012-06-20 NOTE — Assessment & Plan Note (Signed)
For voltaren gel prn,  to f/u any worsening symptoms or concerns 

## 2012-06-20 NOTE — Assessment & Plan Note (Signed)
With some edema, for change avapro to avalide 150/12.5 mg, cont all other meds,  to f/u any worsening symptoms or concerns BP Readings from Last 3 Encounters:  06/20/12 120/70  04/23/12 142/70  03/07/12 130/70

## 2012-10-24 ENCOUNTER — Ambulatory Visit: Payer: Medicare Other | Admitting: Internal Medicine

## 2012-11-01 ENCOUNTER — Other Ambulatory Visit (INDEPENDENT_AMBULATORY_CARE_PROVIDER_SITE_OTHER): Payer: Medicare Other

## 2012-11-01 DIAGNOSIS — I10 Essential (primary) hypertension: Secondary | ICD-10-CM

## 2012-11-01 DIAGNOSIS — Z0000 Encounter for general adult medical examination without abnormal findings: Secondary | ICD-10-CM

## 2012-11-01 DIAGNOSIS — E7800 Pure hypercholesterolemia, unspecified: Secondary | ICD-10-CM

## 2012-11-01 LAB — CBC WITH DIFFERENTIAL/PLATELET
Basophils Absolute: 0 10*3/uL (ref 0.0–0.1)
Basophils Relative: 0.8 % (ref 0.0–3.0)
Eosinophils Absolute: 0.1 10*3/uL (ref 0.0–0.7)
Eosinophils Relative: 1.3 % (ref 0.0–5.0)
HCT: 34.5 % — ABNORMAL LOW (ref 36.0–46.0)
Hemoglobin: 11.6 g/dL — ABNORMAL LOW (ref 12.0–15.0)
Lymphocytes Relative: 40.6 % (ref 12.0–46.0)
Lymphs Abs: 1.9 10*3/uL (ref 0.7–4.0)
MCHC: 33.6 g/dL (ref 30.0–36.0)
MCV: 92.8 fl (ref 78.0–100.0)
Monocytes Absolute: 0.4 10*3/uL (ref 0.1–1.0)
Monocytes Relative: 7.5 % (ref 3.0–12.0)
Neutro Abs: 2.3 10*3/uL (ref 1.4–7.7)
Neutrophils Relative %: 49.8 % (ref 43.0–77.0)
Platelets: 248 10*3/uL (ref 150.0–400.0)
RBC: 3.72 Mil/uL — ABNORMAL LOW (ref 3.87–5.11)
RDW: 14.7 % — ABNORMAL HIGH (ref 11.5–14.6)
WBC: 4.7 10*3/uL (ref 4.5–10.5)

## 2012-11-01 LAB — URINALYSIS, ROUTINE W REFLEX MICROSCOPIC
Ketones, ur: NEGATIVE
Specific Gravity, Urine: 1.01 (ref 1.000–1.030)
Urine Glucose: NEGATIVE
Urobilinogen, UA: 0.2 (ref 0.0–1.0)
pH: 6 (ref 5.0–8.0)

## 2012-11-01 LAB — LIPID PANEL
Cholesterol: 250 mg/dL — ABNORMAL HIGH (ref 0–200)
HDL: 97.9 mg/dL (ref >39.00)
Triglycerides: 67 mg/dL (ref 0.0–149.0)

## 2012-11-01 LAB — BASIC METABOLIC PANEL
BUN: 16 mg/dL (ref 6–23)
CO2: 29 mEq/L (ref 19–32)
Calcium: 10.1 mg/dL (ref 8.4–10.5)
Creatinine, Ser: 1.1 mg/dL (ref 0.4–1.2)
GFR: 62.53 mL/min (ref >60.00)
Glucose, Bld: 88 mg/dL (ref 70–99)
Sodium: 142 mEq/L (ref 135–145)

## 2012-11-01 LAB — HEPATIC FUNCTION PANEL
Albumin: 4.1 g/dL (ref 3.5–5.2)
Alkaline Phosphatase: 57 U/L (ref 39–117)
Total Protein: 7.4 g/dL (ref 6.0–8.3)

## 2012-11-05 ENCOUNTER — Other Ambulatory Visit: Payer: Self-pay | Admitting: Internal Medicine

## 2012-11-08 ENCOUNTER — Ambulatory Visit (INDEPENDENT_AMBULATORY_CARE_PROVIDER_SITE_OTHER): Payer: Medicare Other | Admitting: Internal Medicine

## 2012-11-08 ENCOUNTER — Encounter: Payer: Self-pay | Admitting: Internal Medicine

## 2012-11-08 VITALS — BP 110/60 | HR 87 | Temp 98.2°F | Ht 64.0 in | Wt 127.6 lb

## 2012-11-08 DIAGNOSIS — Z0000 Encounter for general adult medical examination without abnormal findings: Secondary | ICD-10-CM

## 2012-11-08 NOTE — Patient Instructions (Signed)
Please return for a nurse visit if you change your mind about having the new Prevnar pneumonia shot Please continue all other medications as before, and refills have been done if requested. Please have the pharmacy call with any other refills you may need. You can also take tylenol 8 hr at 1 every 8 hrs for your pain Please keep your appointments with your specialists as you have planned Please continue your efforts at being more active, low cholesterol diet, and weight control. You are otherwise up to date with prevention measures today. You are given the copy of your lab results today - all stable  Please remember to sign up for My Chart if you have not done so, as this will be important to you in the future with finding out test results, communicating by private email, and scheduling acute appointments online when needed.  Please return in 6 months, or sooner if needed

## 2012-11-08 NOTE — Assessment & Plan Note (Signed)

## 2012-11-08 NOTE — Progress Notes (Signed)
Subjective:    Patient ID: Lynn Howell, female    DOB: 11/05/1924, 79 y.o.   MRN: [id number]  HPI  Here for wellness and f/u;  Overall doing ok;  Pt denies CP, worsening SOB, DOE, wheezing, orthopnea, PND, worsening LE edema, palpitations, dizziness or syncope.  Pt denies neurological change such as new headache, facial or extremity weakness.  Pt denies polydipsia, polyuria, or low sugar symptoms. Pt states overall good compliance with treatment and medications, good tolerability, and has been trying to follow lower cholesterol diet.  Pt denies worsening depressive symptoms, suicidal ideation or panic. No fever, night sweats, wt loss, loss of appetite, or other constitutional symptoms.  Pt states good ability with ADL's, has low fall risk, home safety reviewed and adequate, no other significant changes in hearing or vision, and only occasionally active with exercise.  Only takes the statin sometimes as she thinks sometimes might be related to nausea, does not want change today.  Voltaren gel not working for knees and ankles, still with stiffness and pain most days, worse in the AM.  Also with left foot swelling most days with mild discomfort, worse later in the day, better in the am to get up. Sister with recent stroke. Feb 2014 MRi reviewed with pt c/w cervcial spondylisis and spinal stenosis.. . Past Medical History  Diagnosis Date  . Hypertension   . Hypercholesteremia   . Stroke   . Arthritis   . ANEMIA-IRON DEFICIENCY 08/01/2007  . ANKLE PAIN, LEFT 01/22/2007  . BACK PAIN, CHRONIC, INTERMITTENT 02/10/2010  . CERUMEN IMPACTION, RIGHT 04/20/2007  . COLONIC POLYPS, HX OF 09/21/2006  . COMMON MIGRAINE 09/21/2006  . CONSTIPATION, CHRONIC 09/21/2006  . CONTACT DERMATITIS 08/01/2008  . DEGENERATIVE JOINT DISEASE, KNEES, BILATERAL 03/19/2010  . DEPRESSION 08/01/2007  . HYPERLIPIDEMIA 08/01/2007  . HYPERTENSION 09/21/2006  . KNEE PAIN, BILATERAL 09/15/2008  . LEG PAIN, RIGHT 03/03/2008  . 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 LUMBAR RADICULOPATHY, RIGHT 03/19/2010  . NEPHROLITHIASIS, HX OF 09/21/2006  . OSTEOPENIA 12/17/2008  . OSTEOPOROSIS 01/22/2007  . SPINAL STENOSIS 12/17/2008  . Swelling of limb 03/19/2010  . URI 04/20/2007  . Left cervical radiculopathy 03/07/2012   Past Surgical History  Procedure Laterality Date  . Cholecystectomy    . Abdominal hysterectomy    . Tubal ligation      reports that she has never smoked. She does not have any smokeless tobacco history on file. She reports that she does not drink alcohol or use illicit drugs. family history includes Cancer in her mother and sister; Diabetes in her brother; Hypertension in her brother and sister. Allergies  Allergen Reactions  . Alendronate Sodium   . Hydrochlorothiazide W-Triamterene   . Norco [Hydrocodone-Acetaminophen] Nausea Only   Current Outpatient Prescriptions on File Prior to Visit  Medication Sig Dispense Refill  . acetaminophen (TYLENOL) 650 MG CR tablet Take 650 mg by mouth 2 (two) times daily.        Marland Kitchen amLODipine (NORVASC) 10 MG tablet TAKE ONE TABLET EACH DAY  90 tablet  1  . aspirin 325 MG tablet Take 325 mg by mouth daily.        . diclofenac sodium (VOLTAREN) 1 % GEL Apply 4 g topically 4 (four) times daily. To knees and ankles  100 g  12  . irbesartan-hydrochlorothiazide (AVALIDE) 150-12.5 MG per tablet Take 1 tablet by mouth daily.  90 tablet  3  . simvastatin (ZOCOR) 20 MG tablet Take 1 tablet (20 mg total)  by mouth daily.  90 tablet  3   No current facility-administered medications on file prior to visit.     Review of Systems Constitutional: Negative for diaphoresis, activity change, appetite change or unexpected weight change.  HENT: Negative for hearing loss, ear pain, facial swelling, mouth sores and neck stiffness.   Eyes: Negative for pain, redness and visual disturbance.  Respiratory: Negative for shortness of breath and wheezing.   Cardiovascular: Negative for chest pain and palpitations.   Gastrointestinal: Negative for diarrhea, blood in stool, abdominal distention or other pain Genitourinary: Negative for hematuria, flank pain or change in urine volume.  Musculoskeletal: Negative for myalgias and joint swelling.  Skin: Negative for color change and wound.  Neurological: Negative for syncope and numbness. other than noted Hematological: Negative for adenopathy.  Psychiatric/Behavioral: Negative for hallucinations, self-injury, decreased concentration and agitation.      Objective:   Physical Exam BP 110/60  Pulse 87  Temp(Src) 98.2 F (36.8 C) (Oral)  Ht 5\' 4"  (1.626 m)  Wt 127 lb 9 oz (57.862 kg)  BMI 21.89 kg/m2  SpO2 97% VS noted,  Constitutional: Pt is oriented to person, place, and time. Appears well-developed and well-nourished.  Head: Normocephalic and atraumatic.  Right Ear: External ear normal.  Left Ear: External ear normal.  Nose: Nose normal.  Mouth/Throat: Oropharynx is clear and moist.  Eyes: Conjunctivae and EOM are normal. Pupils are equal, round, and reactive to light.  Neck: Normal range of motion. Neck supple. No JVD present. No tracheal deviation present.  Cardiovascular: Normal rate, regular rhythm, normal heart sounds and intact distal pulses.   Pulmonary/Chest: Effort normal and breath sounds normal.  Abdominal: Soft. Bowel sounds are normal. There is no tenderness. No HSM  Musculoskeletal: Normal range of motion. Exhibits no edema.  Lymphadenopathy:  Has no cervical adenopathy.  Neurological: Pt is alert and oriented to person, place, and time. Pt has normal reflexes. No cranial nerve deficit.  Skin: Skin is warm and dry. No rash noted.  Psychiatric:  Has  normal mood and affect. Behavior is normal.  No knee or ankle effusions today    Assessment & Plan:

## 2012-12-17 ENCOUNTER — Other Ambulatory Visit: Payer: Self-pay | Admitting: Internal Medicine

## 2013-05-06 ENCOUNTER — Other Ambulatory Visit: Payer: Self-pay | Admitting: Internal Medicine

## 2013-05-09 ENCOUNTER — Ambulatory Visit: Payer: Medicare Other | Admitting: Internal Medicine

## 2013-07-09 ENCOUNTER — Other Ambulatory Visit: Payer: Self-pay | Admitting: Internal Medicine

## 2013-09-12 ENCOUNTER — Ambulatory Visit (INDEPENDENT_AMBULATORY_CARE_PROVIDER_SITE_OTHER): Payer: Medicare Other | Admitting: Internal Medicine

## 2013-09-12 ENCOUNTER — Encounter: Payer: Self-pay | Admitting: Internal Medicine

## 2013-09-12 ENCOUNTER — Other Ambulatory Visit (INDEPENDENT_AMBULATORY_CARE_PROVIDER_SITE_OTHER): Payer: Medicare Other

## 2013-09-12 VITALS — BP 118/68 | HR 85 | Temp 97.5°F | Wt 122.0 lb

## 2013-09-12 DIAGNOSIS — M79604 Pain in right leg: Secondary | ICD-10-CM | POA: Insufficient documentation

## 2013-09-12 DIAGNOSIS — M25562 Pain in left knee: Secondary | ICD-10-CM

## 2013-09-12 DIAGNOSIS — M79671 Pain in right foot: Secondary | ICD-10-CM

## 2013-09-12 DIAGNOSIS — M25569 Pain in unspecified knee: Secondary | ICD-10-CM

## 2013-09-12 DIAGNOSIS — IMO0002 Reserved for concepts with insufficient information to code with codable children: Secondary | ICD-10-CM

## 2013-09-12 DIAGNOSIS — Z0000 Encounter for general adult medical examination without abnormal findings: Secondary | ICD-10-CM

## 2013-09-12 DIAGNOSIS — M79672 Pain in left foot: Secondary | ICD-10-CM

## 2013-09-12 DIAGNOSIS — C4922 Malignant neoplasm of connective and soft tissue of left lower limb, including hip: Secondary | ICD-10-CM | POA: Insufficient documentation

## 2013-09-12 DIAGNOSIS — M79605 Pain in left leg: Secondary | ICD-10-CM

## 2013-09-12 DIAGNOSIS — M79609 Pain in unspecified limb: Secondary | ICD-10-CM

## 2013-09-12 DIAGNOSIS — M25561 Pain in right knee: Secondary | ICD-10-CM

## 2013-09-12 DIAGNOSIS — E785 Hyperlipidemia, unspecified: Secondary | ICD-10-CM

## 2013-09-12 DIAGNOSIS — Z23 Encounter for immunization: Secondary | ICD-10-CM

## 2013-09-12 DIAGNOSIS — M1710 Unilateral primary osteoarthritis, unspecified knee: Secondary | ICD-10-CM

## 2013-09-12 LAB — CBC WITH DIFFERENTIAL/PLATELET
BASOS PCT: 0.6 % (ref 0.0–3.0)
Basophils Absolute: 0 10*3/uL (ref 0.0–0.1)
EOS PCT: 0.6 % (ref 0.0–5.0)
Eosinophils Absolute: 0 10*3/uL (ref 0.0–0.7)
HCT: 34.9 % — ABNORMAL LOW (ref 36.0–46.0)
Hemoglobin: 11.5 g/dL — ABNORMAL LOW (ref 12.0–15.0)
LYMPHS PCT: 41.2 % (ref 12.0–46.0)
Lymphs Abs: 1.8 10*3/uL (ref 0.7–4.0)
MCHC: 32.9 g/dL (ref 30.0–36.0)
MCV: 95.1 fl (ref 78.0–100.0)
MONOS PCT: 7.4 % (ref 3.0–12.0)
Monocytes Absolute: 0.3 10*3/uL (ref 0.1–1.0)
NEUTROS PCT: 50.2 % (ref 43.0–77.0)
Neutro Abs: 2.2 10*3/uL (ref 1.4–7.7)
Platelets: 238 10*3/uL (ref 150.0–400.0)
RBC: 3.68 Mil/uL — AB (ref 3.87–5.11)
RDW: 13.9 % (ref 11.5–15.5)
WBC: 4.4 10*3/uL (ref 4.0–10.5)

## 2013-09-12 LAB — URINALYSIS, ROUTINE W REFLEX MICROSCOPIC
Bilirubin Urine: NEGATIVE
Hgb urine dipstick: NEGATIVE
Ketones, ur: NEGATIVE
NITRITE: NEGATIVE
PH: 5.5 (ref 5.0–8.0)
Specific Gravity, Urine: <=1.005 — AB (ref 1.000–1.030)
TOTAL PROTEIN, URINE-UPE24: NEGATIVE
Urine Glucose: NEGATIVE
Urobilinogen, UA: 0.2 (ref 0.0–1.0)

## 2013-09-12 LAB — BASIC METABOLIC PANEL
BUN: 17 mg/dL (ref 6–23)
CHLORIDE: 102 meq/L (ref 96–112)
CO2: 26 meq/L (ref 19–32)
CREATININE: 1.2 mg/dL (ref 0.4–1.2)
Calcium: 10 mg/dL (ref 8.4–10.5)
GFR: 56.29 mL/min — ABNORMAL LOW (ref >60.00)
GLUCOSE: 87 mg/dL (ref 70–99)
Potassium: 3.6 mEq/L (ref 3.5–5.1)
Sodium: 139 mEq/L (ref 135–145)

## 2013-09-12 LAB — LIPID PANEL
CHOL/HDL RATIO: 3
Cholesterol: 244 mg/dL — ABNORMAL HIGH (ref 0–200)
HDL: 87.2 mg/dL (ref >39.00)
LDL Cholesterol: 137 mg/dL — ABNORMAL HIGH (ref 0–99)
NonHDL: 156.8
TRIGLYCERIDES: 98 mg/dL (ref 0.0–149.0)
VLDL: 19.6 mg/dL (ref 0.0–40.0)

## 2013-09-12 LAB — HEPATIC FUNCTION PANEL
ALBUMIN: 4.2 g/dL (ref 3.5–5.2)
ALT: 14 U/L (ref 0–35)
AST: 28 U/L (ref 0–37)
Alkaline Phosphatase: 57 U/L (ref 39–117)
BILIRUBIN TOTAL: 0.8 mg/dL (ref 0.2–1.2)
Bilirubin, Direct: 0.1 mg/dL (ref 0.0–0.3)
TOTAL PROTEIN: 7.4 g/dL (ref 6.0–8.3)

## 2013-09-12 LAB — TSH: TSH: 1.71 u[IU]/mL (ref 0.35–4.50)

## 2013-09-12 MED ORDER — ATORVASTATIN CALCIUM 10 MG PO TABS: 10.0000 mg | ORAL_TABLET | Freq: Every day | ORAL | Status: DC

## 2013-09-12 MED ORDER — AMLODIPINE BESYLATE 10 MG PO TABS: ORAL_TABLET | ORAL | Status: DC

## 2013-09-12 MED ORDER — TRAMADOL HCL 50 MG PO TABS: 50.0000 mg | ORAL_TABLET | Freq: Four times a day (QID) | ORAL | Status: DC | PRN

## 2013-09-12 MED ORDER — IRBESARTAN-HYDROCHLOROTHIAZIDE 150-12.5 MG PO TABS: ORAL_TABLET | ORAL | Status: DC

## 2013-09-12 NOTE — Assessment & Plan Note (Signed)
Most liley arthritic, cant r/o claudication - for LE art duplex bilat

## 2013-09-12 NOTE — Assessment & Plan Note (Signed)
Ok to change zocor to lipitor 10 qd

## 2013-09-12 NOTE — Progress Notes (Signed)
Subjective:    Patient ID: Lynn Howell, female    DOB: 11/05/1924, 80 y.o.   MRN: [id number]  HPI  Here for wellness and f/u;  Overall doing ok;  Pt denies CP, worsening SOB, DOE, wheezing, orthopnea, PND, worsening LE edema, palpitations, dizziness or syncope.  Pt denies neurological change such as new headache, facial or extremity weakness.  Pt denies polydipsia, polyuria, or low sugar symptoms. Pt states overall good compliance with treatment and medications, good tolerability, and has been trying to follow lower cholesterol diet.  Pt denies worsening depressive symptoms, suicidal ideation or panic. No fever, night sweats, wt loss, loss of appetite, or other constitutional symptoms.  Pt states good ability with ADL's, has low fall risk, home safety reviewed and adequate, no other significant changes in hearing or vision, and only occasionally active with exercise.  Does C/o worsening LE pain and stiffness, specifically both feet pain and knee stiffness.  Tylenol 98 hr not working any more. Tries to stay active so they dont "close up on me." Sometimes with pain "everywhere" maybe worse below knees, maybe sometimes worse to ambulate, nonsmoker in past.  Has hx of knee arthritis, also spinal stenosis lumbar. No recent eval for LE circualatin. Zocor makes her nauseas, only takes a few nights per wk.  Still very ambulatory well without cane or assist, ride city bus to come here and shopping.  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 Past Medical History  Diagnosis Date  . Hypertension   . Hypercholesteremia   . Stroke   . Arthritis   . ANEMIA-IRON DEFICIENCY 08/01/2007  . ANKLE PAIN, LEFT 01/22/2007  . BACK PAIN, CHRONIC, INTERMITTENT 02/10/2010  . CERUMEN IMPACTION, RIGHT 04/20/2007  . COLONIC POLYPS, HX OF 09/21/2006  . COMMON MIGRAINE 09/21/2006  . CONSTIPATION, CHRONIC 09/21/2006  . CONTACT DERMATITIS 08/01/2008  . DEGENERATIVE JOINT DISEASE, KNEES,  BILATERAL 03/19/2010  . DEPRESSION 08/01/2007  . HYPERLIPIDEMIA 08/01/2007  . HYPERTENSION 09/21/2006  . KNEE PAIN, BILATERAL 09/15/2008  . LEG PAIN, RIGHT 03/03/2008  . 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 LUMBAR RADICULOPATHY, RIGHT 03/19/2010  . NEPHROLITHIASIS, HX OF 09/21/2006  . OSTEOPENIA 12/17/2008  . OSTEOPOROSIS 01/22/2007  . SPINAL STENOSIS 12/17/2008  . Swelling of limb 03/19/2010  . URI 04/20/2007  . Left cervical radiculopathy 03/07/2012   Past Surgical History  Procedure Laterality Date  . Cholecystectomy    . Abdominal hysterectomy    . Tubal ligation      reports that she has never smoked. She does not have any smokeless tobacco history on file. She reports that she does not drink alcohol or use illicit drugs. family history includes Cancer in her mother and sister; Diabetes in her brother; Hypertension in her brother and sister. Allergies  Allergen Reactions  . Alendronate Sodium   . Hydrochlorothiazide W-Triamterene   . Norco [Hydrocodone-Acetaminophen] Nausea Only   Current Outpatient Prescriptions on File Prior to Visit  Medication Sig Dispense Refill  . acetaminophen (TYLENOL) 650 MG CR tablet Take 650 mg by mouth 2 (two) times daily.        Marland Kitchen amLODipine (NORVASC) 10 MG tablet TAKE ONE TABLET EACH DAY  90 tablet  1  . aspirin 325 MG tablet Take 325 mg by mouth daily.        . irbesartan-hydrochlorothiazide (AVALIDE) 150-12.5 MG per tablet TAKE ONE TABLET EACH DAY  90 tablet  1  . polyethylene glycol powder (  GLYCOLAX/MIRALAX) powder TAKE 17 GRAM TWICE DAILY  527 g  1   No current facility-administered medications on file prior to visit.    Review of Systems Constitutional: Negative for increased diaphoresis, other activity, appetite or other siginficant weight change  HENT: Negative for worsening hearing loss, ear pain, facial swelling, mouth sores and neck stiffness.   Eyes: Negative for other worsening pain, redness or visual disturbance.  Respiratory: Negative  for shortness of breath and wheezing.   Cardiovascular: Negative for chest pain and palpitations.  Gastrointestinal: Negative for diarrhea, blood in stool, abdominal distention or other pain Genitourinary: Negative for hematuria, flank pain or change in urine volume.  Musculoskeletal: Negative for myalgias or other joint complaints.  Skin: Negative for color change and wound.  Neurological: Negative for syncope and numbness. other than noted Hematological: Negative for adenopathy. or other swelling Psychiatric/Behavioral: Negative for hallucinations, self-injury, decreased concentration or other worsening agitation.      Objective:   Physical Exam BP 118/68  Pulse 85  Temp(Src) 97.5 F (36.4 C) (Oral)  Wt 122 lb (55.339 kg)  SpO2 99% VS noted,  Constitutional: Pt is oriented to person, place, and time. Appears well-developed and well-nourished.  Head: Normocephalic and atraumatic.  Right Ear: External ear normal.  Left Ear: External ear normal.  Nose: Nose normal.  Mouth/Throat: Oropharynx is clear and moist.  Eyes: Conjunctivae and EOM are normal. Pupils are equal, round, and reactive to light.  Neck: Normal range of motion. Neck supple. No JVD present. No tracheal deviation present.  Cardiovascular: Normal rate, regular rhythm, normal heart sounds and intact distal pulses.   Pulmonary/Chest: Effort normal and breath sounds without rales or wheezing  Abdominal: Soft. Bowel sounds are normal. NT. No HSM  Musculoskeletal: Normal range of motion. Exhibits no edema.  Lymphadenopathy:  Has no cervical adenopathy.  Neurological: Pt is alert and oriented to person, place, and time. Pt has normal reflexes. No cranial nerve deficit. Motor grossly intact Skin: Skin is warm and dry. No rash noted. dorsalis pedis pulse trace bilat Psychiatric:  Has normal mood and affect. Behavior is normal.     Assessment & Plan:

## 2013-09-12 NOTE — Assessment & Plan Note (Signed)

## 2013-09-12 NOTE — Patient Instructions (Signed)
Please take all new medication as prescribed - the tramadol for pain  OK to stop the zocor when you are out  Please take all new medication as prescribed - the lipitor 10 mg (you can take in the AM if you like)  You can still take the tylenol (acetaminophen) as well if you like, for pain  Please continue all other medications as before, and refills have been done if requested - the blood pressure medications  Please have the pharmacy call with any other refills you may need.  Please continue your efforts at being more active, low cholesterol diet, and weight control.  You are otherwise up to date with prevention measures today.  You will be contacted regarding the referral for: Dr Smith/sport medicine for the knees and feet, as well as the test for leg circulation  Please keep your appointments with your specialists as you may have planned - [place] - yearly for the left thigh  Please go to the LAB in the Basement (turn left off the elevator) for the tests to be done today  You will be contacted by phone if any changes need to be made immediately.  Otherwise, you will receive a letter about your results with an explanation, but please check with MyChart first.  Please remember to sign up for MyChart if you have not done so, as this will be important to you in the future with finding out test results, communicating by private email, and scheduling acute appointments online when needed.  Please return in 6 months, or sooner if needed

## 2013-09-12 NOTE — Progress Notes (Signed)
Pre visit review using our clinic review tool, if applicable. No additional management support is needed unless otherwise documented below in the visit note. 

## 2013-09-12 NOTE — Assessment & Plan Note (Signed)
And feet pain - likely deg pain, tylenol not working, for tramadol for now, refer Dr Smith/sport med

## 2013-09-16 ENCOUNTER — Other Ambulatory Visit (HOSPITAL_COMMUNITY): Payer: Self-pay | Admitting: Cardiology

## 2013-09-16 DIAGNOSIS — M79606 Pain in leg, unspecified: Secondary | ICD-10-CM

## 2013-09-18 ENCOUNTER — Encounter (HOSPITAL_COMMUNITY): Payer: Medicare Other

## 2013-09-18 DIAGNOSIS — R0989 Other specified symptoms and signs involving the circulatory and respiratory systems: Secondary | ICD-10-CM

## 2013-10-09 ENCOUNTER — Other Ambulatory Visit: Payer: Self-pay | Admitting: Internal Medicine

## 2013-10-31 ENCOUNTER — Ambulatory Visit: Payer: Medicare Other | Admitting: Internal Medicine

## 2013-11-07 ENCOUNTER — Other Ambulatory Visit: Payer: Self-pay

## 2013-11-07 DIAGNOSIS — I70213 Atherosclerosis of native arteries of extremities with intermittent claudication, bilateral legs: Secondary | ICD-10-CM

## 2013-11-21 ENCOUNTER — Encounter (HOSPITAL_COMMUNITY): Payer: Medicare Other

## 2013-11-21 ENCOUNTER — Encounter: Payer: Medicare Other | Admitting: Vascular Surgery

## 2013-12-25 ENCOUNTER — Encounter: Payer: Self-pay | Admitting: Vascular Surgery

## 2013-12-26 ENCOUNTER — Ambulatory Visit (INDEPENDENT_AMBULATORY_CARE_PROVIDER_SITE_OTHER): Payer: Medicare Other | Admitting: Vascular Surgery

## 2013-12-26 ENCOUNTER — Ambulatory Visit (HOSPITAL_COMMUNITY)
Admission: RE | Admit: 2013-12-26 | Discharge: 2013-12-26 | Disposition: A | Discharge status: Home / Self Care | Payer: Medicare Other | Source: Ambulatory Visit | Attending: Vascular Surgery | Admitting: Vascular Surgery

## 2013-12-26 ENCOUNTER — Encounter: Payer: Self-pay | Admitting: Vascular Surgery

## 2013-12-26 VITALS — BP 152/63 | HR 90 | Resp 18 | Ht 67.0 in | Wt 125.3 lb

## 2013-12-26 DIAGNOSIS — I70213 Atherosclerosis of native arteries of extremities with intermittent claudication, bilateral legs: Secondary | ICD-10-CM | POA: Diagnosis present

## 2013-12-26 DIAGNOSIS — M79605 Pain in left leg: Secondary | ICD-10-CM

## 2013-12-26 DIAGNOSIS — M79604 Pain in right leg: Secondary | ICD-10-CM

## 2013-12-26 NOTE — Progress Notes (Signed)
[facility] HISTORY AND PHYSICAL   History of Present Illness:  Patient is a 80 y.o. year old female who presents for evaluation of bilateral lower extremity leg pain.  The patient states the pain has been present for 7-8 years. The pain starts from the knees and extends down to the feet bilaterally. The pain can occur while she is sitting standing or laying down at night. The pain does get worse with walking. She has been evaluated in the past with a vascular evaluation elsewhere as well as by orthopedics. She does not really describe claudication-type symptoms. She denies numbness or tingling in the feet. She does not describe pain specifically the one joint. She has not taken any anti-inflammatory medication in the past. She does have a history of a liposarcoma in the left leg and is followed at [facility] for this. She has had no prior operations on her lower extremities.  Other medical problems include hypertension, elevated cholesterol, remote history of stroke, arthritis, chronic back pain, hypertension, spinal stenosis, all of which are currently stable.  Past Medical History  Diagnosis Date  . Hypertension   . Hypercholesteremia   . Stroke   . Arthritis   . ANEMIA-IRON DEFICIENCY 08/01/2007  . ANKLE PAIN, LEFT 01/22/2007  . BACK PAIN, CHRONIC, INTERMITTENT 02/10/2010  . CERUMEN IMPACTION, RIGHT 04/20/2007  . COLONIC POLYPS, HX OF 09/21/2006  . COMMON MIGRAINE 09/21/2006  . CONSTIPATION, CHRONIC 09/21/2006  . CONTACT DERMATITIS 08/01/2008  . DEGENERATIVE JOINT DISEASE, KNEES, BILATERAL 03/19/2010  . DEPRESSION 08/01/2007  . HYPERLIPIDEMIA 08/01/2007  . HYPERTENSION 09/21/2006  . KNEE PAIN, BILATERAL 09/15/2008  . LEG PAIN, RIGHT 03/03/2008  . 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 LUMBAR RADICULOPATHY, RIGHT 03/19/2010  . NEPHROLITHIASIS, HX OF 09/21/2006  . OSTEOPENIA 12/17/2008  . OSTEOPOROSIS 01/22/2007  . SPINAL STENOSIS 12/17/2008  . Swelling of limb 03/19/2010  .  URI 04/20/2007  . Left cervical radiculopathy 03/07/2012    Past Surgical History  Procedure Laterality Date  . Cholecystectomy    . Abdominal hysterectomy    . Tubal ligation      Social History History  Substance Use Topics  . Smoking status: Never Smoker   . Smokeless tobacco: Not on file  . Alcohol Use: No    Family History Family History  Problem Relation Age of Onset  . Cancer Mother   . Cancer Sister   . Hypertension Sister   . Diabetes Brother   . Hypertension Brother     Allergies  Allergies  Allergen Reactions  . Alendronate Sodium   . Hydrochlorothiazide W-Triamterene   . Norco [Hydrocodone-Acetaminophen] Nausea Only     Current Outpatient Prescriptions  Medication Sig Dispense Refill  . amLODipine (NORVASC) 10 MG tablet TAKE ONE TABLET EACH DAY 90 tablet 3  . aspirin 325 MG tablet Take 325 mg by mouth daily.      Marland Kitchen atorvastatin (LIPITOR) 10 MG tablet Take 1 tablet (10 mg total) by mouth daily. 90 tablet 3  . Calcium Carb-Cholecalciferol (CALCIUM + D3 PO) Take 600 mg by mouth.    . irbesartan-hydrochlorothiazide (AVALIDE) 150-12.5 MG per tablet TAKE ONE TABLET EACH DAY 90 tablet 3  . polyethylene glycol powder (GLYCOLAX/MIRALAX) powder TAKE 17 GRAM TWICE DAILY 527 g 1  . simvastatin (ZOCOR) 20 MG tablet TAKE ONE TABLET BY MOUTH ONCE DAILY 30 tablet 11  . acetaminophen (TYLENOL) 650 MG CR tablet Take 650 mg by mouth 2 (two) times daily.      Marland Kitchen  traMADol (ULTRAM) 50 MG tablet Take 1 tablet (50 mg total) by mouth every 6 (six) hours as needed. (Patient not taking: Reported on 12/26/2013) 60 tablet 3   No current facility-administered medications for this visit.    ROS:   General:  No weight loss, Fever, chills  HEENT: No recent headaches, no nasal bleeding, no visual changes, no sore throat  Neurologic: No dizziness, blackouts, seizures. No recent symptoms of stroke or mini- stroke. No recent episodes of slurred speech, or temporary  blindness.  Cardiac: No recent episodes of chest pain/pressure, no shortness of breath at rest.  No shortness of breath with exertion.  Denies history of atrial fibrillation or irregular heartbeat  Vascular: No history of rest pain in feet.  No history of claudication.  No history of non-healing ulcer, No history of DVT   Pulmonary: No home oxygen, no productive cough, no hemoptysis,  No asthma or wheezing  Musculoskeletal:  [ ]  Arthritis, [ ]  Low back pain,  [ ]  Joint pain  Hematologic:No history of hypercoagulable state.  No history of easy bleeding.  No history of anemia  Gastrointestinal: No hematochezia or melena,  No gastroesophageal reflux, no trouble swallowing  Urinary: [ ]  chronic Kidney disease, [ ]  on HD - [ ]  MWF or [ ]  TTHS, [ ]  Burning with urination, [ ]  Frequent urination, [ ]  Difficulty urinating;   Skin: No rashes  Psychological: No history of anxiety,  + history of depression   Physical Examination  Filed Vitals:   12/26/13 1409  BP: 152/63  Pulse: 90  Resp: 18  Height: 5\' 7"  (1.702 m)  Weight: 125 lb 4.8 oz (56.836 kg)    Body mass index is 19.62 kg/(m^2).  General:  Alert and oriented, no acute distress HEENT: Normal Neck: No bruit or JVD Pulmonary: Clear to auscultation bilaterally Cardiac: Regular Rate and Rhythm without murmur Abdomen: Soft, non-tender, non-distended, no mass, no scars Skin: No rash Extremity Pulses:  2+ radial, brachial, femoral bilaterally, 2+ left dorsalis pedis, 2+ posterior tibial pulse left leg and right  Musculoskeletal: Left thigh asymmetric slightly larger than right.  Neurologic: Upper and lower extremity motor 5/5 and symmetric  DATA:  Patient had bilateral ABIs performed today were greater than 1 bilaterally with biphasic to triphasic waveforms she also had normal digit pressures greater than 100. I reviewed and interpreted this study.   ASSESSMENT:  Bilateral leg pain. Patient has normal arterial tree by pulse  palpation as well as objective noninvasive vascular exam. Her lower extremity pain may be secondary to spinal stenosis or pseudo-claudication.   PLAN:  I spoke with the patient today regarding starting some anti-inflammatory medication in the form of Advil. Hopefully this will give her some symptomatic relief. Further evaluation of her back could be performed however her symptoms overall seem fairly mild and with her age and do not believe she would be a great candidate for any sort of operative intervention. I will leave this at the discretion of her primary care physician.  Ruta Hinds, MD [facility] [place] Office: [phone] Pager: [phone]

## 2014-01-31 ENCOUNTER — Ambulatory Visit (INDEPENDENT_AMBULATORY_CARE_PROVIDER_SITE_OTHER): Payer: Medicare Other | Admitting: Internal Medicine

## 2014-01-31 ENCOUNTER — Encounter: Payer: Self-pay | Admitting: Internal Medicine

## 2014-01-31 VITALS — BP 130/70 | HR 91 | Temp 97.8°F | Ht 67.0 in | Wt 121.5 lb

## 2014-01-31 DIAGNOSIS — M79672 Pain in left foot: Secondary | ICD-10-CM

## 2014-01-31 DIAGNOSIS — M545 Low back pain, unspecified: Secondary | ICD-10-CM

## 2014-01-31 DIAGNOSIS — I10 Essential (primary) hypertension: Secondary | ICD-10-CM

## 2014-01-31 MED ORDER — GABAPENTIN 100 MG PO CAPS: 100.0000 mg | ORAL_CAPSULE | Freq: Three times a day (TID) | ORAL | Status: DC

## 2014-01-31 NOTE — Patient Instructions (Signed)
Please take all new medication as prescribed - the gabapentin  Please continue all other medications as before, and refills have been done if requested.  Please have the pharmacy call with any other refills you may need.  Please keep your appointments with your specialists as you may have planned

## 2014-01-31 NOTE — Progress Notes (Signed)
Pre visit review using our clinic review tool, if applicable. No additional management support is needed unless otherwise documented below in the visit note. 

## 2014-01-31 NOTE — Progress Notes (Signed)
Subjective:    Patient ID: Lynn Howell, female    DOB: 11/05/1924, 81 y.o.   MRN: [id number]  HPIHere with son who notes pt is here with chronic complaints, including left foot pain and swelling, possible worse recent, has seen ortho recently, alleve helps.  Pt continues to have recurring LBP without change in severity, bowel or bladder change, fever, wt loss,  worsening LE numbness/weakness, gait change or falls, but with radiation of pain with ambulation to upper legs.   Past Medical History  Diagnosis Date  . Hypertension   . Hypercholesteremia   . Stroke   . Arthritis   . ANEMIA-IRON DEFICIENCY 08/01/2007  . ANKLE PAIN, LEFT 01/22/2007  . BACK PAIN, CHRONIC, INTERMITTENT 02/10/2010  . CERUMEN IMPACTION, RIGHT 04/20/2007  . COLONIC POLYPS, HX OF 09/21/2006  . COMMON MIGRAINE 09/21/2006  . CONSTIPATION, CHRONIC 09/21/2006  . CONTACT DERMATITIS 08/01/2008  . DEGENERATIVE JOINT DISEASE, KNEES, BILATERAL 03/19/2010  . DEPRESSION 08/01/2007  . HYPERLIPIDEMIA 08/01/2007  . HYPERTENSION 09/21/2006  . KNEE PAIN, BILATERAL 09/15/2008  . LEG PAIN, RIGHT 03/03/2008  . 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 LUMBAR RADICULOPATHY, RIGHT 03/19/2010  . NEPHROLITHIASIS, HX OF 09/21/2006  . OSTEOPENIA 12/17/2008  . OSTEOPOROSIS 01/22/2007  . SPINAL STENOSIS 12/17/2008  . Swelling of limb 03/19/2010  . URI 04/20/2007  . Left cervical radiculopathy 03/07/2012   Past Surgical History  Procedure Laterality Date  . Cholecystectomy    . Abdominal hysterectomy    . Tubal ligation      reports that she has never smoked. She does not have any smokeless tobacco history on file. She reports that she does not drink alcohol or use illicit drugs. family history includes Cancer in her mother and sister; Diabetes in her brother; Hypertension in her brother and sister. Allergies  Allergen Reactions  . Alendronate Sodium   . Hydrochlorothiazide W-Triamterene   . Norco [Hydrocodone-Acetaminophen] Nausea Only   Current  Outpatient Prescriptions on File Prior to Visit  Medication Sig Dispense Refill  . acetaminophen (TYLENOL) 650 MG CR tablet Take 650 mg by mouth 2 (two) times daily.      Marland Kitchen amLODipine (NORVASC) 10 MG tablet TAKE ONE TABLET EACH DAY 90 tablet 3  . aspirin 325 MG tablet Take 325 mg by mouth daily.      Marland Kitchen atorvastatin (LIPITOR) 10 MG tablet Take 1 tablet (10 mg total) by mouth daily. 90 tablet 3  . Calcium Carb-Cholecalciferol (CALCIUM + D3 PO) Take 600 mg by mouth.    . irbesartan-hydrochlorothiazide (AVALIDE) 150-12.5 MG per tablet TAKE ONE TABLET EACH DAY 90 tablet 3  . polyethylene glycol powder (GLYCOLAX/MIRALAX) powder TAKE 17 GRAM TWICE DAILY 527 g 1  . simvastatin (ZOCOR) 20 MG tablet TAKE ONE TABLET BY MOUTH ONCE DAILY 30 tablet 11  . traMADol (ULTRAM) 50 MG tablet Take 1 tablet (50 mg total) by mouth every 6 (six) hours as needed. 60 tablet 3   No current facility-administered medications on file prior to visit.   Review of Systems  Constitutional: Negative for unusual diaphoresis or other sweats  HENT: Negative for ringing in ear Eyes: Negative for double vision or worsening visual disturbance.  Respiratory: Negative for choking and stridor.   Gastrointestinal: Negative for vomiting or other signifcant bowel change Genitourinary: Negative for hematuria or decreased urine volume.  Musculoskeletal: Negative for other MSK pain or swelling Skin: Negative for color change and worsening wound.  Neurological: Negative for tremors and numbness other than noted  Psychiatric/Behavioral: Negative for decreased concentration or agitation other than above       Objective:   Physical Exam BP 130/70 mmHg  Pulse 91  Temp(Src) 97.8 F (36.6 C) (Oral)  Ht 5\' 7"  (1.702 m)  Wt 121 lb 8 oz (55.112 kg)  BMI 19.03 kg/m2  SpO2 97% VS noted,  Constitutional: Pt appears well-developed, well-nourished.  HENT: Head: NCAT.  Right Ear: External ear normal.  Left Ear: External ear normal.  Eyes: .  Pupils are equal, round, and reactive to light. Conjunctivae and EOM are normal Neck: Normal range of motion. Neck supple.  Cardiovascular: Normal rate and regular rhythm.   Pulmonary/Chest: Effort normal and breath sounds without rales or wheezing.  Abd:  Soft, NT, ND, + BS Spine nontender Neurological: Pt is alert. Not confused , motor grossly intact Skin: Skin is warm. No rash, left foot with diffuse mid foot swelling, NT, ankle without Effusion or tender Psychiatric: Pt behavior is normal. No agitation.   Wt Readings from Last 3 Encounters:  01/31/14 121 lb 8 oz (55.112 kg)  12/26/13 125 lb 4.8 oz (56.836 kg)  09/12/13 122 lb (55.339 kg)       Assessment & Plan:

## 2014-02-01 DIAGNOSIS — M79672 Pain in left foot: Secondary | ICD-10-CM | POA: Insufficient documentation

## 2014-02-01 NOTE — Assessment & Plan Note (Signed)
Chronic stable, no new neuro change, for trial gabapentin 100 tid, consider increased dose if not well controlled

## 2014-02-01 NOTE — Assessment & Plan Note (Signed)
stable overall by history and exam, recent data reviewed with pt, and pt to continue medical treatment as before,  to f/u any worsening symptoms or concerns BP Readings from Last 3 Encounters:  01/31/14 130/70  12/26/13 152/63  09/12/13 118/68

## 2014-02-01 NOTE — Assessment & Plan Note (Signed)
Chronic recurrent, c/w msk strain, no DJD per ortho, cont alleve prn

## 2014-02-14 ENCOUNTER — Other Ambulatory Visit: Payer: Self-pay | Admitting: Internal Medicine

## 2014-03-13 ENCOUNTER — Ambulatory Visit: Payer: Medicare Other | Admitting: Internal Medicine

## 2014-03-14 ENCOUNTER — Ambulatory Visit: Payer: Medicare Other | Admitting: Internal Medicine

## 2014-03-21 ENCOUNTER — Ambulatory Visit: Payer: Medicare Other | Admitting: Internal Medicine

## 2014-04-02 ENCOUNTER — Encounter: Payer: Self-pay | Admitting: Internal Medicine

## 2014-04-02 ENCOUNTER — Ambulatory Visit (INDEPENDENT_AMBULATORY_CARE_PROVIDER_SITE_OTHER): Payer: Medicare Other | Admitting: Internal Medicine

## 2014-04-02 VITALS — BP 132/60 | HR 82 | Temp 98.4°F | Resp 16 | Ht 67.0 in | Wt 125.2 lb

## 2014-04-02 DIAGNOSIS — M79604 Pain in right leg: Secondary | ICD-10-CM

## 2014-04-02 DIAGNOSIS — I10 Essential (primary) hypertension: Secondary | ICD-10-CM

## 2014-04-02 DIAGNOSIS — Z0000 Encounter for general adult medical examination without abnormal findings: Secondary | ICD-10-CM

## 2014-04-02 DIAGNOSIS — Z0189 Encounter for other specified special examinations: Secondary | ICD-10-CM

## 2014-04-02 DIAGNOSIS — M79605 Pain in left leg: Secondary | ICD-10-CM

## 2014-04-02 DIAGNOSIS — E785 Hyperlipidemia, unspecified: Secondary | ICD-10-CM

## 2014-04-02 NOTE — Progress Notes (Signed)
Pre visit review using our clinic review tool, if applicable. No additional management support is needed unless otherwise documented below in the visit note. 

## 2014-04-02 NOTE — Progress Notes (Signed)
Subjective:    Patient ID: Lynn Howell, female    DOB: 11/05/1924, 81 y.o.   MRN: [id number]  HPI   Here to f/u, " I feel good except for my leg pain."  C/p persistent pain to legs bilat "from my knees to my toes", mod to severe, not improved so far with empiric pain tx, incluidng tramadol. Had normal ABI dec 2015.  Had seen ortho prior with no significant arthritis per pt.  On neurontin at 100 tid, Pt continues to have recurring LBP but minor to her compared to the legs pain, no, bowel or bladder change, fever, wt loss,  worsening LE pain/numbness/weakness, gait change or falls, but does not want surgury, and not offered ESI as pt was hesitant.  Has not had NCS to date.  Pt on reflection decides to hold on furhter eval at this time, also hearing is "fading" plans for hearing eval but delcines referal at this time due to cost.  Does not take statin reguarly, not sure why, tyring to follow a lower chol diet.  Pt denies chest pain, increased sob or doe, wheezing, orthopnea, PND, increased LE swelling, palpitations, dizziness or syncope.  Pt denies new neurological symptoms such as new headache, or facial or extremity weakness or numbness  Past Medical History  Diagnosis Date  . Hypertension   . Hypercholesteremia   . Stroke   . Arthritis   . ANEMIA-IRON DEFICIENCY 08/01/2007  . ANKLE PAIN, LEFT 01/22/2007  . BACK PAIN, CHRONIC, INTERMITTENT 02/10/2010  . CERUMEN IMPACTION, RIGHT 04/20/2007  . COLONIC POLYPS, HX OF 09/21/2006  . COMMON MIGRAINE 09/21/2006  . CONSTIPATION, CHRONIC 09/21/2006  . CONTACT DERMATITIS 08/01/2008  . DEGENERATIVE JOINT DISEASE, KNEES, BILATERAL 03/19/2010  . DEPRESSION 08/01/2007  . HYPERLIPIDEMIA 08/01/2007  . HYPERTENSION 09/21/2006  . KNEE PAIN, BILATERAL 09/15/2008  . LEG PAIN, RIGHT 03/03/2008  . 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 LUMBAR RADICULOPATHY, RIGHT 03/19/2010  . NEPHROLITHIASIS, HX OF 09/21/2006  . OSTEOPENIA 12/17/2008  . OSTEOPOROSIS 01/22/2007  . SPINAL  STENOSIS 12/17/2008  . Swelling of limb 03/19/2010  . URI 04/20/2007  . Left cervical radiculopathy 03/07/2012   Past Surgical History  Procedure Laterality Date  . Cholecystectomy    . Abdominal hysterectomy    . Tubal ligation      reports that she has never smoked. She does not have any smokeless tobacco history on file. She reports that she does not drink alcohol or use illicit drugs. family history includes Cancer in her mother and sister; Diabetes in her brother; Hypertension in her brother and sister. Allergies  Allergen Reactions  . Alendronate Sodium   . Hydrochlorothiazide W-Triamterene   . Norco [Hydrocodone-Acetaminophen] Nausea Only   Current Outpatient Prescriptions on File Prior to Visit  Medication Sig Dispense Refill  . acetaminophen (TYLENOL) 650 MG CR tablet Take 650 mg by mouth 2 (two) times daily.      Marland Kitchen amLODipine (NORVASC) 10 MG tablet TAKE ONE TABLET EACH DAY 90 tablet 3  . aspirin 325 MG tablet Take 325 mg by mouth daily.      Marland Kitchen atorvastatin (LIPITOR) 10 MG tablet Take 1 tablet (10 mg total) by mouth daily. 90 tablet 3  . Calcium Carb-Cholecalciferol (CALCIUM + D3 PO) Take 600 mg by mouth.    . gabapentin (NEURONTIN) 100 MG capsule Take 1 capsule (100 mg total) by mouth 3 (three) times daily. 90 capsule 5  . irbesartan-hydrochlorothiazide (AVALIDE) 150-12.5 MG per tablet TAKE ONE TABLET EACH  DAY 90 tablet 3  . polyethylene glycol powder (GLYCOLAX/MIRALAX) powder TAKE 17G TWICE DAILY 527 g 11  . simvastatin (ZOCOR) 20 MG tablet TAKE ONE TABLET BY MOUTH ONCE DAILY 30 tablet 11  . traMADol (ULTRAM) 50 MG tablet Take 1 tablet (50 mg total) by mouth every 6 (six) hours as needed. 60 tablet 3   No current facility-administered medications on file prior to visit.      Review of Systems  Constitutional: Negative for unusual diaphoresis or night sweats HENT: Negative for ringing in ear or discharge Eyes: Negative for double vision or worsening visual disturbance.    Respiratory: Negative for choking and stridor.   Gastrointestinal: Negative for vomiting or other signifcant bowel change Genitourinary: Negative for hematuria or change in urine volume.  Musculoskeletal: Negative for other MSK pain or swelling Skin: Negative for color change and worsening wound.  Neurological: Negative for tremors and numbness other than noted  Psychiatric/Behavioral: Negative for decreased concentration or agitation other than above       Objective:   Physical Exam BP 132/60 mmHg  Pulse 82  Temp(Src) 98.4 F (36.9 C) (Oral)  Resp 16  Ht 5\' 7"  (1.702 m)  Wt 125 lb 3.2 oz (56.79 kg)  BMI 19.60 kg/m2  SpO2 98% VS noted,  Constitutional: Pt appears in no significant distress HENT: Head: NCAT.  Right Ear: External ear normal.  Left Ear: External ear normal.  Eyes: . Pupils are equal, round, and reactive to light. Conjunctivae and EOM are normal Neck: Normal range of motion. Neck supple.  Cardiovascular: Normal rate and regular rhythm.   Pulmonary/Chest: Effort normal and breath sounds without rales or wheezing.  Abd:  Soft, NT, ND, + BS Neurological: Pt is alert. Not confused , motor grossly intact Skin: Skin is warm. No rash, no LE edema Psychiatric: Pt behavior is normal. No agitation.      Assessment & Plan:

## 2014-04-02 NOTE — Assessment & Plan Note (Addendum)
Chronic issue, ? Related to lower back/spine, I offered to order NCS/EMG but she declines for now, will d/w daughter, also declines change of neurontin trial to 300 tid, Please continue all other medications as before,  to f/u any worsening symptoms or concerns

## 2014-04-02 NOTE — Assessment & Plan Note (Signed)
Urged compliacne with statin, as this is not likely assoc with her leg pain,  to f/u any worsening symptoms or concerns Lab Results  Component Value Date   LDLCALC 137* 09/12/2013

## 2014-04-02 NOTE — Assessment & Plan Note (Signed)
stable overall by history and exam, recent data reviewed with pt, and pt to continue medical treatment as before,  to f/u any worsening symptoms or concerns BP Readings from Last 3 Encounters:  04/02/14 132/60  01/31/14 130/70  12/26/13 152/63

## 2014-04-02 NOTE — Patient Instructions (Signed)

## 2014-04-03 ENCOUNTER — Telehealth: Payer: Self-pay | Admitting: Internal Medicine

## 2014-04-03 NOTE — Telephone Encounter (Signed)
emmi mailed  °

## 2014-09-24 ENCOUNTER — Other Ambulatory Visit: Payer: Self-pay | Admitting: Internal Medicine

## 2014-11-18 ENCOUNTER — Other Ambulatory Visit: Payer: Self-pay | Admitting: Internal Medicine

## 2014-11-21 ENCOUNTER — Ambulatory Visit: Payer: Medicare Other | Admitting: Internal Medicine

## 2014-11-26 ENCOUNTER — Ambulatory Visit: Payer: Medicare Other | Admitting: Internal Medicine

## 2014-11-28 ENCOUNTER — Ambulatory Visit: Payer: Self-pay | Admitting: Podiatry

## 2014-12-03 ENCOUNTER — Encounter: Payer: Self-pay | Admitting: Podiatry

## 2014-12-03 ENCOUNTER — Ambulatory Visit (INDEPENDENT_AMBULATORY_CARE_PROVIDER_SITE_OTHER): Payer: Medicare Other | Admitting: Podiatry

## 2014-12-03 VITALS — BP 138/71 | HR 66 | Resp 16

## 2014-12-03 DIAGNOSIS — M79674 Pain in right toe(s): Secondary | ICD-10-CM | POA: Diagnosis not present

## 2014-12-03 DIAGNOSIS — M2040 Other hammer toe(s) (acquired), unspecified foot: Secondary | ICD-10-CM | POA: Diagnosis not present

## 2014-12-03 DIAGNOSIS — B351 Tinea unguium: Secondary | ICD-10-CM | POA: Diagnosis not present

## 2014-12-03 DIAGNOSIS — M79675 Pain in left toe(s): Secondary | ICD-10-CM | POA: Diagnosis not present

## 2014-12-03 DIAGNOSIS — M79605 Pain in left leg: Secondary | ICD-10-CM

## 2014-12-03 DIAGNOSIS — M79604 Pain in right leg: Secondary | ICD-10-CM

## 2014-12-03 DIAGNOSIS — L84 Corns and callosities: Secondary | ICD-10-CM

## 2014-12-03 NOTE — Progress Notes (Signed)
   Subjective:    Patient ID: Lynn Howell, female    DOB: 11/05/1924, 81 y.o.   MRN: [id number]  HPI Pt presents with painful 2nd right toe callus   Review of Systems  All other systems reviewed and are negative.      Objective:   Physical Exam        Assessment & Plan:

## 2014-12-03 NOTE — Progress Notes (Signed)
Subjective:     Patient ID: Lynn Howell, female   DOB: 11/05/1924, 81 y.o.   MRN: [id number]  HPI patient presents with a painful right second and third toe with hammertoe deformities and nail disease. States that she's walking on the ends for toes which develops cords and thick toenails and it's been going on for a long time and getting gradually worse   Review of Systems  All other systems reviewed and are negative.      Objective:   Physical Exam  Constitutional: She is oriented to person, place, and time.  Cardiovascular: Intact distal pulses.   Musculoskeletal: Normal range of motion.  Neurological: She is oriented to person, place, and time.  Skin: Skin is warm.  Nursing note and vitals reviewed.  neurovascular status intact muscle strength adequate range of motion within normal limits with patient noted to have painful distal keratotic lesions digits 2 and 3 right with thickened nails and structural deformity noted right over left. Patient has good digital perfusion and is well oriented 3     Assessment:     Combination of hammertoe deformity along with keratotic lesion formation and nail disease secondary to toe structure    Plan:     H&P and conditions reviewed with patient. Today I debrided the distal lesions applied buttress pad and debrided nailbeds that are painful. Patient tolerated this well and she stated it helped and she'll be seen back as needed and I did educate her on hammertoe correction that could be possible

## 2014-12-05 ENCOUNTER — Ambulatory Visit (INDEPENDENT_AMBULATORY_CARE_PROVIDER_SITE_OTHER): Payer: Medicare Other | Admitting: Internal Medicine

## 2014-12-05 ENCOUNTER — Encounter: Payer: Self-pay | Admitting: Internal Medicine

## 2014-12-05 ENCOUNTER — Other Ambulatory Visit (INDEPENDENT_AMBULATORY_CARE_PROVIDER_SITE_OTHER): Payer: Medicare Other

## 2014-12-05 VITALS — BP 118/60 | HR 79 | Temp 98.6°F | Ht 67.0 in | Wt 126.0 lb

## 2014-12-05 DIAGNOSIS — M179 Osteoarthritis of knee, unspecified: Secondary | ICD-10-CM

## 2014-12-05 DIAGNOSIS — IMO0002 Reserved for concepts with insufficient information to code with codable children: Secondary | ICD-10-CM

## 2014-12-05 DIAGNOSIS — M1710 Unilateral primary osteoarthritis, unspecified knee: Secondary | ICD-10-CM

## 2014-12-05 DIAGNOSIS — E785 Hyperlipidemia, unspecified: Secondary | ICD-10-CM

## 2014-12-05 DIAGNOSIS — I10 Essential (primary) hypertension: Secondary | ICD-10-CM

## 2014-12-05 LAB — CBC WITH DIFFERENTIAL/PLATELET
BASOS PCT: 0.6 % (ref 0.0–3.0)
Basophils Absolute: 0 10*3/uL (ref 0.0–0.1)
EOS ABS: 0 10*3/uL (ref 0.0–0.7)
EOS PCT: 0.8 % (ref 0.0–5.0)
HEMATOCRIT: 33.1 % — AB (ref 36.0–46.0)
HEMOGLOBIN: 10.9 g/dL — AB (ref 12.0–15.0)
LYMPHS PCT: 28.6 % (ref 12.0–46.0)
Lymphs Abs: 1.4 10*3/uL (ref 0.7–4.0)
MCHC: 32.9 g/dL (ref 30.0–36.0)
MCV: 90.1 fl (ref 78.0–100.0)
MONOS PCT: 7.8 % (ref 3.0–12.0)
Monocytes Absolute: 0.4 10*3/uL (ref 0.1–1.0)
NEUTROS ABS: 3 10*3/uL (ref 1.4–7.7)
Neutrophils Relative %: 62.2 % (ref 43.0–77.0)
Platelets: 252 10*3/uL (ref 150.0–400.0)
RBC: 3.68 Mil/uL — ABNORMAL LOW (ref 3.87–5.11)
RDW: 15.3 % (ref 11.5–15.5)
WBC: 4.9 10*3/uL (ref 4.0–10.5)

## 2014-12-05 LAB — HEPATIC FUNCTION PANEL
ALBUMIN: 4 g/dL (ref 3.5–5.2)
ALK PHOS: 63 U/L (ref 39–117)
ALT: 14 U/L (ref 0–35)
AST: 23 U/L (ref 0–37)
BILIRUBIN DIRECT: 0 mg/dL (ref 0.0–0.3)
TOTAL PROTEIN: 7.1 g/dL (ref 6.0–8.3)
Total Bilirubin: 0.4 mg/dL (ref 0.2–1.2)

## 2014-12-05 LAB — URINALYSIS, ROUTINE W REFLEX MICROSCOPIC
Bilirubin Urine: NEGATIVE
HGB URINE DIPSTICK: NEGATIVE
KETONES UR: NEGATIVE
Leukocytes, UA: NEGATIVE
NITRITE: NEGATIVE
RBC / HPF: NONE SEEN (ref 0–?)
Specific Gravity, Urine: 1.02 (ref 1.000–1.030)
Total Protein, Urine: NEGATIVE
URINE GLUCOSE: NEGATIVE
Urobilinogen, UA: 0.2 (ref 0.0–1.0)
WBC UA: NONE SEEN (ref 0–?)
pH: 5.5 (ref 5.0–8.0)

## 2014-12-05 LAB — BASIC METABOLIC PANEL
BUN: 19 mg/dL (ref 6–23)
CALCIUM: 9.7 mg/dL (ref 8.4–10.5)
CHLORIDE: 105 meq/L (ref 96–112)
CO2: 28 meq/L (ref 19–32)
CREATININE: 1.14 mg/dL (ref 0.40–1.20)
GFR: 57.84 mL/min — ABNORMAL LOW (ref >60.00)
GLUCOSE: 85 mg/dL (ref 70–99)
Potassium: 3.9 mEq/L (ref 3.5–5.1)
Sodium: 141 mEq/L (ref 135–145)

## 2014-12-05 LAB — LIPID PANEL
CHOLESTEROL: 220 mg/dL — AB (ref 0–200)
HDL: 92.5 mg/dL (ref >39.00)
LDL Cholesterol: 116 mg/dL — ABNORMAL HIGH (ref 0–99)
NONHDL: 127.43
Total CHOL/HDL Ratio: 2
Triglycerides: 59 mg/dL (ref 0.0–149.0)
VLDL: 11.8 mg/dL (ref 0.0–40.0)

## 2014-12-05 LAB — TSH: TSH: 3.66 u[IU]/mL (ref 0.35–4.50)

## 2014-12-05 NOTE — Patient Instructions (Signed)
Please continue all other medications as before, and refills have been done if requested.  Please have the pharmacy call with any other refills you may need.  Please continue your efforts at being more active, low cholesterol diet, and weight control.  You are otherwise up to date with prevention measures today.  Please keep your appointments with your specialists as you may have planned  You will be contacted regarding the referral for: Dr Tamala Julian for the knees  Please go to the LAB in the Basement (turn left off the elevator) for the tests to be done today  You will be contacted by phone if any changes need to be made immediately.  Otherwise, you will receive a letter about your results with an explanation, but please check with MyChart first.  Please remember to sign up for MyChart if you have not done so, as this will be important to you in the future with finding out test results, communicating by private email, and scheduling acute appointments online when needed.  Please return in 6 months, or sooner if needed

## 2014-12-05 NOTE — Progress Notes (Signed)
Pre visit review using our clinic review tool, if applicable. No additional management support is needed unless otherwise documented below in the visit note. 

## 2014-12-05 NOTE — Progress Notes (Signed)
Subjective:    Patient ID: Lynn Howell, female    DOB: 11/05/1924, 81 y.o.   MRN: [id number]  HPI  Here to fu, c/o 6 mo worsening  bilat knee achy pain, mild to mod but more on the mod side, assoc with worsening ambulation, a bit more unsteady, but no falls or giveaways.  Walking and standing makes worse, has little to no pain with sitting.  Pt denies chest pain, increased sob or doe, wheezing, orthopnea, PND, increased LE swelling, palpitations, dizziness or syncope.  Pt denies new neurological symptoms such as new headache, or facial or extremity weakness or numbness   Pt denies polydipsia, polyuria Past Medical History  Diagnosis Date  . Hypertension   . Hypercholesteremia   . Stroke ([place])   . Arthritis   . ANEMIA-IRON DEFICIENCY 08/01/2007  . ANKLE PAIN, LEFT 01/22/2007  . BACK PAIN, CHRONIC, INTERMITTENT 02/10/2010  . CERUMEN IMPACTION, RIGHT 04/20/2007  . COLONIC POLYPS, HX OF 09/21/2006  . COMMON MIGRAINE 09/21/2006  . CONSTIPATION, CHRONIC 09/21/2006  . CONTACT DERMATITIS 08/01/2008  . DEGENERATIVE JOINT DISEASE, KNEES, BILATERAL 03/19/2010  . DEPRESSION 08/01/2007  . HYPERLIPIDEMIA 08/01/2007  . HYPERTENSION 09/21/2006  . KNEE PAIN, BILATERAL 09/15/2008  . LEG PAIN, RIGHT 03/03/2008  . Liposarcoma of lower extremity ([place]) 04/14/2010  . LUMBAR RADICULOPATHY, RIGHT 03/19/2010  . NEPHROLITHIASIS, HX OF 09/21/2006  . OSTEOPENIA 12/17/2008  . OSTEOPOROSIS 01/22/2007  . SPINAL STENOSIS 12/17/2008  . Swelling of limb 03/19/2010  . URI 04/20/2007  . Left cervical radiculopathy 03/07/2012   Past Surgical History  Procedure Laterality Date  . Cholecystectomy    . Abdominal hysterectomy    . Tubal ligation      reports that she has never smoked. She does not have any smokeless tobacco history on file. She reports that she does not drink alcohol or use illicit drugs. family history includes Cancer in her mother and sister; Diabetes in her brother; Hypertension in her brother and sister. Allergies  Allergen  Reactions  . Alendronate Sodium   . Hydrochlorothiazide W-Triamterene   . Norco [Hydrocodone-Acetaminophen] Nausea Only   Current Outpatient Prescriptions on File Prior to Visit  Medication Sig Dispense Refill  . amLODipine (NORVASC) 10 MG tablet TAKE ONE TABLET EVERY DAY 90 tablet 1  . aspirin 325 MG tablet Take 325 mg by mouth daily.      Marland Kitchen atorvastatin (LIPITOR) 10 MG tablet Take 1 tablet (10 mg total) by mouth daily. 90 tablet 3  . irbesartan-hydrochlorothiazide (AVALIDE) 150-12.5 MG per tablet TAKE ONE TABLET EACH DAY 90 tablet 1  . polyethylene glycol powder (GLYCOLAX/MIRALAX) powder TAKE 17G TWICE DAILY 527 g 11  . acetaminophen (TYLENOL) 650 MG CR tablet Take 650 mg by mouth 2 (two) times daily.      . Calcium Carb-Cholecalciferol (CALCIUM + D3 PO) Take 600 mg by mouth.     No current facility-administered medications on file prior to visit.   Review of Systems  Constitutional: Negative for unusual diaphoresis or night sweats HENT: Negative for ringing in ear or discharge Eyes: Negative for double vision or worsening visual disturbance.  Respiratory: Negative for choking and stridor.   Gastrointestinal: Negative for vomiting or other signifcant bowel change Genitourinary: Negative for hematuria or change in urine volume.  Musculoskeletal: Negative for other MSK pain or swelling Skin: Negative for color change and worsening wound.  Neurological: Negative for tremors and numbness other than noted  Psychiatric/Behavioral: Negative for decreased concentration or agitation other than above  '  Objective:   Physical Exam BP 118/60 mmHg  Pulse 79  Temp(Src) 98.6 F (37 C) (Oral)  Ht 5\' 7"  (1.702 m)  Wt 126 lb (57.153 kg)  BMI 19.73 kg/m2  SpO2 99% VS noted,  Constitutional: Pt appears in no significant distress HENT: Head: NCAT.  Right Ear: External ear normal.  Left Ear: External ear normal.  Eyes: . Pupils are equal, round, and reactive to light. Conjunctivae and EOM  are normal Neck: Normal range of motion. Neck supple.  Cardiovascular: Normal rate and regular rhythm.   Pulmonary/Chest: Effort normal and breath sounds without rales or wheezing.  Abd:  Soft, NT, ND, + BS Neurological: Pt is alert. Not confused , motor grossly intact Skin: Skin is warm. No rash, no LE edema Psychiatric: Pt behavior is normal. No agitation.  Bilat knee with crepitus, no effusion, FROM    Assessment & Plan:

## 2014-12-06 NOTE — Assessment & Plan Note (Signed)
Mild to mod worsening, for referral sport med , tylenol prn

## 2014-12-06 NOTE — Assessment & Plan Note (Signed)
stable overall by history and exam, recent data reviewed with pt, and pt to continue medical treatment as before,  to f/u any worsening symptoms or concerns BP Readings from Last 3 Encounters:  12/05/14 118/60  12/03/14 138/71  04/02/14 132/60

## 2014-12-06 NOTE — Assessment & Plan Note (Signed)
stable overall by history and exam, recent data reviewed with pt, and pt to continue medical treatment as before,  to f/u any worsening symptoms or concerns Lab Results  Component Value Date   LDLCALC 116* 12/05/2014   For f/u lab, goal < 100

## 2014-12-09 ENCOUNTER — Other Ambulatory Visit: Payer: Medicare Other

## 2014-12-09 DIAGNOSIS — D649 Anemia, unspecified: Secondary | ICD-10-CM

## 2014-12-18 ENCOUNTER — Encounter: Payer: Self-pay | Admitting: Internal Medicine

## 2014-12-19 ENCOUNTER — Telehealth: Payer: Self-pay

## 2014-12-19 ENCOUNTER — Other Ambulatory Visit (INDEPENDENT_AMBULATORY_CARE_PROVIDER_SITE_OTHER): Payer: Medicare Other

## 2014-12-19 DIAGNOSIS — D509 Iron deficiency anemia, unspecified: Secondary | ICD-10-CM

## 2014-12-19 LAB — IBC PANEL
IRON: 58 ug/dL (ref 42–145)
Saturation Ratios: 14.4 % — ABNORMAL LOW (ref 20.0–50.0)
Transferrin: 287 mg/dL (ref 212.0–360.0)

## 2014-12-19 NOTE — Telephone Encounter (Signed)
Lab request order be re-entered

## 2014-12-22 ENCOUNTER — Ambulatory Visit: Payer: Medicare Other | Admitting: Family Medicine

## 2015-01-05 ENCOUNTER — Ambulatory Visit: Payer: Medicare Other | Admitting: Family Medicine

## 2015-02-06 ENCOUNTER — Ambulatory Visit: Payer: Medicare Other | Admitting: Podiatry

## 2015-02-11 ENCOUNTER — Ambulatory Visit (INDEPENDENT_AMBULATORY_CARE_PROVIDER_SITE_OTHER): Payer: Medicare Other | Admitting: Podiatry

## 2015-02-11 ENCOUNTER — Encounter: Payer: Self-pay | Admitting: Podiatry

## 2015-02-11 VITALS — BP 125/63 | HR 81 | Resp 16

## 2015-02-11 DIAGNOSIS — L84 Corns and callosities: Secondary | ICD-10-CM

## 2015-02-11 DIAGNOSIS — M2040 Other hammer toe(s) (acquired), unspecified foot: Secondary | ICD-10-CM | POA: Diagnosis not present

## 2015-02-11 NOTE — Progress Notes (Signed)
Subjective:     Patient ID: Lynn Howell, female   DOB: 11/05/1924, 82 y.o.   MRN: [id number]  HPI patient presents with painful digital deformity second and third toes the right foot with keratotic lesion formation and structural deformity   Review of Systems     Objective:   Physical Exam Neurovascular status unchanged with hammertoe deformity second third toe right with distal keratotic lesion second over third digit    Assessment:     Hammertoe deformity with structural abnormalities the toe along with distal keratotic lesions    Plan:     Debrided the lesions fully with no iatrogenic bleeding and discussed with son considerations for digital fusions toe would like to avoid that may be necessary for this patient

## 2015-02-18 ENCOUNTER — Other Ambulatory Visit: Payer: Self-pay | Admitting: Internal Medicine

## 2015-04-06 ENCOUNTER — Other Ambulatory Visit: Payer: Self-pay | Admitting: Internal Medicine

## 2015-05-11 ENCOUNTER — Ambulatory Visit (INDEPENDENT_AMBULATORY_CARE_PROVIDER_SITE_OTHER): Payer: Medicare Other | Admitting: Podiatry

## 2015-05-11 ENCOUNTER — Encounter: Payer: Self-pay | Admitting: Podiatry

## 2015-05-11 ENCOUNTER — Other Ambulatory Visit: Payer: Self-pay | Admitting: Podiatry

## 2015-05-11 VITALS — BP 147/79 | HR 93 | Resp 16

## 2015-05-11 DIAGNOSIS — I999 Unspecified disorder of circulatory system: Secondary | ICD-10-CM | POA: Diagnosis not present

## 2015-05-11 DIAGNOSIS — M2040 Other hammer toe(s) (acquired), unspecified foot: Secondary | ICD-10-CM | POA: Diagnosis not present

## 2015-05-11 DIAGNOSIS — L84 Corns and callosities: Secondary | ICD-10-CM | POA: Diagnosis not present

## 2015-05-11 DIAGNOSIS — R0989 Other specified symptoms and signs involving the circulatory and respiratory systems: Secondary | ICD-10-CM | POA: Diagnosis not present

## 2015-05-11 NOTE — Progress Notes (Signed)
Subjective:     Patient ID: Lynn Howell, female   DOB: 11/05/1924, 82 y.o.   MRN: [id number]  HPI patient presents with painful right second digit distal that she states only got better for about 6 weeks after last treatment that she wants to know whether surgery is a possibility   Review of Systems     Objective:   Physical Exam Vascular status is diminished and concerning to me with neurological standpoint intact. Patient's found to have elongation second digit with distal rotation of the toe and distal keratotic lesion with moderate lip lesion also on the proximal phalanx.    Assessment:     Digital deformity with possibility also for vascular disease due to symptoms that she explains plus claudication-like symptomatology    Plan:     H&P and hammertoe correction discussed with patient. I don't 1 do anything until we get vascular checked and she will have vascular evaluation done and today I debrided distal lesion and applied to different types of padding to take pressure off

## 2015-05-20 ENCOUNTER — Telehealth: Payer: Self-pay | Admitting: *Deleted

## 2015-05-20 NOTE — Telephone Encounter (Addendum)
Pt states cancel her vascular study, her sons don't want her to have it.  I spoke with pt she states her sons want her to continue to have the area shaved every year, and not have surgery. I told pt this test would let us know how good her blood flow is to her feet. Pt states she doesn't need another bill, and to cancel the appt.  05/21/2015-Cancelled [place] doppler appt.

## 2015-05-21 ENCOUNTER — Inpatient Hospital Stay (HOSPITAL_COMMUNITY): Admission: RE | Admit: 2015-05-21 | Payer: Medicare Other | Source: Ambulatory Visit

## 2015-05-25 ENCOUNTER — Other Ambulatory Visit: Payer: Self-pay | Admitting: Internal Medicine

## 2015-06-26 ENCOUNTER — Encounter (INDEPENDENT_AMBULATORY_CARE_PROVIDER_SITE_OTHER): Payer: Self-pay | Admitting: Family

## 2015-06-26 ENCOUNTER — Encounter: Payer: Self-pay | Admitting: Family

## 2015-06-26 NOTE — Progress Notes (Signed)
Pre visit review using our clinic review tool, if applicable. No additional management support is needed unless otherwise documented below in the visit note. 

## 2015-06-28 NOTE — Progress Notes (Signed)
Error

## 2015-08-20 ENCOUNTER — Ambulatory Visit (INDEPENDENT_AMBULATORY_CARE_PROVIDER_SITE_OTHER): Payer: Medicare Other | Admitting: Internal Medicine

## 2015-08-20 ENCOUNTER — Encounter: Payer: Self-pay | Admitting: Internal Medicine

## 2015-08-20 VITALS — BP 120/70 | HR 84 | Temp 97.5°F | Wt 122.0 lb

## 2015-08-20 DIAGNOSIS — F411 Generalized anxiety disorder: Secondary | ICD-10-CM | POA: Diagnosis not present

## 2015-08-20 DIAGNOSIS — F329 Major depressive disorder, single episode, unspecified: Secondary | ICD-10-CM

## 2015-08-20 DIAGNOSIS — I10 Essential (primary) hypertension: Secondary | ICD-10-CM

## 2015-08-20 DIAGNOSIS — F32A Depression, unspecified: Secondary | ICD-10-CM

## 2015-08-20 NOTE — Patient Instructions (Signed)
Please continue all other medications as before, and refills have been done if requested.  Please have the pharmacy call with any other refills you may need.  Please keep your appointments with your specialists as you may have planned    

## 2015-08-20 NOTE — Progress Notes (Signed)
Subjective:    Patient ID: Lynn Howell, female    DOB: 11/05/1924, 82 y.o.   MRN: [id number]  HPI  Here to f/u; overall doing ok,  Pt denies chest pain, increasing sob or doe, wheezing, orthopnea, PND, increased LE swelling, palpitations, dizziness or syncope.  Pt denies new neurological symptoms such as new facial or extremity weakness or numbness.  Pt denies polydipsia, polyuria, or low sugar episode.   Pt denies new neurological symptoms such as new headache, or facial or extremity weakness or numbness.   Has had several recent shooting pains to various parts of her head without vision change, pain is fleeting.  Pt states overall good compliance with meds.  Denies worsening depressive symptoms, suicidal ideation, or panic; has ongoing anxiety, sometimes really bothers her but does not want further tx Past Medical History:  Diagnosis Date  . ANEMIA-IRON DEFICIENCY 08/01/2007  . ANKLE PAIN, LEFT 01/22/2007  . Arthritis   . BACK PAIN, CHRONIC, INTERMITTENT 02/10/2010  . CERUMEN IMPACTION, RIGHT 04/20/2007  . COLONIC POLYPS, HX OF 09/21/2006  . COMMON MIGRAINE 09/21/2006  . CONSTIPATION, CHRONIC 09/21/2006  . CONTACT DERMATITIS 08/01/2008  . DEGENERATIVE JOINT DISEASE, KNEES, BILATERAL 03/19/2010  . DEPRESSION 08/01/2007  . Hypercholesteremia   . HYPERLIPIDEMIA 08/01/2007  . Hypertension   . HYPERTENSION 09/21/2006  . KNEE PAIN, BILATERAL 09/15/2008  . Left cervical radiculopathy 03/07/2012  . LEG PAIN, RIGHT 03/03/2008  . 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 LUMBAR RADICULOPATHY, RIGHT 03/19/2010  . NEPHROLITHIASIS, HX OF 09/21/2006  . OSTEOPENIA 12/17/2008  . OSTEOPOROSIS 01/22/2007  . SPINAL STENOSIS 12/17/2008  . Stroke ([place])   . Swelling of limb 03/19/2010  . URI 04/20/2007   Past Surgical History:  Procedure Laterality Date  . ABDOMINAL HYSTERECTOMY    . CHOLECYSTECTOMY    . TUBAL LIGATION      reports that she has never smoked. She does not have any smokeless tobacco history on file. She  reports that she does not drink alcohol or use drugs. family history includes Cancer in her mother and sister; Diabetes in her brother; Hypertension in her brother and sister. Allergies  Allergen Reactions  . Alendronate Sodium   . Hydrochlorothiazide W-Triamterene   . Norco [Hydrocodone-Acetaminophen] Nausea Only   Current Outpatient Prescriptions on File Prior to Visit  Medication Sig Dispense Refill  . amLODipine (NORVASC) 10 MG tablet TAKE ONE TABLET EACH DAY 90 tablet 0  . aspirin 325 MG tablet Take 325 mg by mouth daily.      Marland Kitchen atorvastatin (LIPITOR) 10 MG tablet Take 1 tablet (10 mg total) by mouth daily. 90 tablet 3  . Calcium Carb-Cholecalciferol (CALCIUM + D3 PO) Take 600 mg by mouth.    . irbesartan-hydrochlorothiazide (AVALIDE) 150-12.5 MG tablet TAKE ONE TABLET EACH DAY 90 tablet 2  . polyethylene glycol powder (GLYCOLAX/MIRALAX) powder 17 GRAMS TWICE DAILY 527 g 5   No current facility-administered medications on file prior to visit.    Review of Systems  Constitutional: Negative for unusual diaphoresis or night sweats HENT: Negative for ear swelling or discharge Eyes: Negative for worsening visual haziness  Respiratory: Negative for choking and stridor.   Gastrointestinal: Negative for distension or worsening eructation Genitourinary: Negative for retention or change in urine volume.  Musculoskeletal: Negative for other MSK pain or swelling Skin: Negative for color change and worsening wound Neurological: Negative for tremors and numbness other than noted  Psychiatric/Behavioral: Negative for decreased concentration or agitation other than above  Objective:   Physical Exam BP 120/70   Pulse 84   Temp 97.5 F (36.4 C)   Wt 122 lb (55.3 kg)   SpO2 94%   BMI 19.11 kg/m  VS noted,  Constitutional: Pt is oriented to person, place, and time. Appears well-developed and well-nourished, in no significant distress Head: Normocephalic and atraumatic  Eyes:  Conjunctivae and EOM are normal. Pupils are equal, round, and reactive to light Right Ear: External ear normal.  Left Ear: External ear normal Nose: Nose normal.  Mouth/Throat: Oropharynx is clear and moist  Neck: Normal range of motion. Neck supple. No JVD present. No tracheal deviation present or significant neck LA or mass Cardiovascular: Normal rate, regular rhythm, normal heart sounds and intact distal pulses.   Pulmonary/Chest: Effort normal and breath sounds without rales or wheezing  Abdominal: Soft. Bowel sounds are normal. NT. No HSM  Musculoskeletal: Normal range of motion. Exhibits no edema Lymphadenopathy: Has no cervical adenopathy.  Neurological: Pt is alert and oriented to person, place, and time. Pt has normal reflexes. No cranial nerve deficit. Motor 5/5 intact Skin: Skin is warm and dry. No rash noted or new ulcers Psychiatric:  Has normal mood and affect. Behavior is normal. 1-2+ nervous    Assessment & Plan:

## 2015-08-20 NOTE — Progress Notes (Signed)
Pre visit review using our clinic review tool, if applicable. No additional management support is needed unless otherwise documented below in the visit note. 

## 2015-08-22 NOTE — Assessment & Plan Note (Signed)
stable overall by history and exam, recent data reviewed with pt, and pt to continue medical treatment as before,  to f/u any worsening symptoms or concerns BP Readings from Last 3 Encounters:  08/20/15 120/70  05/11/15 (!) 147/79  02/11/15 125/63

## 2015-08-22 NOTE — Assessment & Plan Note (Signed)
stable overall by history and exam, recent data reviewed with pt, and pt to continue medical treatment as before,  to f/u any worsening symptoms or concerns Lab Results  Component Value Date   WBC 4.9 12/05/2014   HGB 10.9 (L) 12/05/2014   HCT 33.1 (L) 12/05/2014   PLT 252.0 12/05/2014   GLUCOSE 85 12/05/2014   CHOL 220 (H) 12/05/2014   TRIG 59.0 12/05/2014   HDL 92.50 12/05/2014   LDLDIRECT 134.5 11/01/2012   LDLCALC 116 (H) 12/05/2014   ALT 14 12/05/2014   AST 23 12/05/2014   NA 141 12/05/2014   K 3.9 12/05/2014   CL 105 12/05/2014   CREATININE 1.14 12/05/2014   BUN 19 12/05/2014   CO2 28 12/05/2014   TSH 3.66 12/05/2014

## 2015-08-22 NOTE — Assessment & Plan Note (Signed)
Mild increased recent, declines referral counseling or other tx

## 2015-08-31 ENCOUNTER — Other Ambulatory Visit: Payer: Self-pay | Admitting: Internal Medicine

## 2015-11-30 ENCOUNTER — Other Ambulatory Visit: Payer: Self-pay | Admitting: Internal Medicine

## 2016-01-20 ENCOUNTER — Other Ambulatory Visit: Payer: Self-pay | Admitting: Internal Medicine

## 2016-03-08 ENCOUNTER — Telehealth: Payer: Self-pay | Admitting: Internal Medicine

## 2016-03-08 NOTE — Telephone Encounter (Signed)
Pt has an appt for tomorrow.  ?

## 2016-03-08 NOTE — Telephone Encounter (Signed)
[facility]  Patient Name: Lynn Howell  DOB: 11/05/1924    Initial Comment Caller states she's having trouble with her BM- constipation.   Nurse Assessment  Nurse: Harlow Mares, RN, Suanne Marker Date/Time (Eastern Time): 03/08/2016 1:05:45 PM  Confirm and document reason for call. If symptomatic, describe symptoms. ---Caller states she's having trouble with her BM- constipation. Constipation comes and goes. Reports that she is less active than before. She is taking polythene glycol BID when needed. On occasion she takes MOM for results. Reports that she noted around Dec of last year that she began to have bouts of constipation and then a few days of loose stools. Has had 3 BM's today.  Does the patient have any new or worsening symptoms? ---Yes  Will a triage be completed? ---Yes  Related visit to physician within the last 2 weeks? ---N/A  Does the PT have any chronic conditions? (i.e. diabetes, asthma, etc.) ---Yes  List chronic conditions. ---constipation; HTN;  Is this a behavioral health or substance abuse call? ---No     Guidelines    Guideline Title Affirmed Question Affirmed Notes  Diarrhea [1] MILD (e.g., 1-3 or more stools than normal in past 24 hours) diarrhea without known cause AND [2] present > 7 days    Final Disposition User   See PCP When Office is Open (within 3 days) Harlow Mares, RN, Suanne Marker    Comments  Caller scheduled for appt tomorrow at 6:15pm with Dr. Jenny Reichmann at the [facility] office. Caller voiced understanding. Advised to cancel appt for next Monday if not needed.   Referrals  REFERRED TO PCP OFFICE   Disagree/Comply: Comply

## 2016-03-09 ENCOUNTER — Ambulatory Visit (INDEPENDENT_AMBULATORY_CARE_PROVIDER_SITE_OTHER): Payer: Medicare Other | Admitting: Internal Medicine

## 2016-03-09 ENCOUNTER — Encounter: Payer: Self-pay | Admitting: Internal Medicine

## 2016-03-09 VITALS — BP 122/50 | HR 91 | Temp 98.1°F | Ht 67.0 in | Wt 127.0 lb

## 2016-03-09 DIAGNOSIS — F411 Generalized anxiety disorder: Secondary | ICD-10-CM

## 2016-03-09 DIAGNOSIS — R195 Other fecal abnormalities: Secondary | ICD-10-CM | POA: Diagnosis not present

## 2016-03-09 DIAGNOSIS — R197 Diarrhea, unspecified: Secondary | ICD-10-CM

## 2016-03-09 DIAGNOSIS — I10 Essential (primary) hypertension: Secondary | ICD-10-CM | POA: Diagnosis not present

## 2016-03-09 MED ORDER — CHOLESTYRAMINE 4 G PO PACK: 4.0000 g | PACK | Freq: Three times a day (TID) | ORAL | 5 refills | Status: DC | PRN

## 2016-03-09 NOTE — Progress Notes (Signed)
Subjective:    Patient ID: Lynn Howell, female    DOB: 11/05/1924, 83 y.o.   MRN: [id number]  HPI  Here with daughter with c/o recurring loose stools, non watery, without blood, small volume, without n/v, abd pain, low appetite, fever or wt loss.  Ongoing for several wks. No recent med changes,  No sick contacts at home Denies worsening reflux, abd pain, dysphagia.  Pt denies chest pain, increased sob or doe, wheezing, orthopnea, PND, increased LE swelling, palpitations, dizziness or syncope.   Pt denies polydipsia, polyuria.  Denies hyper or hypo thyroid symptoms such as voice, skin or hair change.No worsening recent stressors..   Denies urinary symptoms such as dysuria, frequency, urgency, flank pain, hematuria  Has not tried OTC such as kaopectate Wt Readings from Last 3 Encounters:  03/09/16 127 lb (57.6 kg)  08/20/15 122 lb (55.3 kg)  06/26/15 126 lb (57.2 kg)   Past Medical History:  Diagnosis Date  . ANEMIA-IRON DEFICIENCY 08/01/2007  . ANKLE PAIN, LEFT 01/22/2007  . Arthritis   . BACK PAIN, CHRONIC, INTERMITTENT 02/10/2010  . CERUMEN IMPACTION, RIGHT 04/20/2007  . COLONIC POLYPS, HX OF 09/21/2006  . COMMON MIGRAINE 09/21/2006  . CONSTIPATION, CHRONIC 09/21/2006  . CONTACT DERMATITIS 08/01/2008  . DEGENERATIVE JOINT DISEASE, KNEES, BILATERAL 03/19/2010  . DEPRESSION 08/01/2007  . Hypercholesteremia   . HYPERLIPIDEMIA 08/01/2007  . Hypertension   . HYPERTENSION 09/21/2006  . KNEE PAIN, BILATERAL 09/15/2008  . Left cervical radiculopathy 03/07/2012  . LEG PAIN, RIGHT 03/03/2008  . 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 LUMBAR RADICULOPATHY, RIGHT 03/19/2010  . NEPHROLITHIASIS, HX OF 09/21/2006  . OSTEOPENIA 12/17/2008  . OSTEOPOROSIS 01/22/2007  . SPINAL STENOSIS 12/17/2008  . Stroke ([place])   . Swelling of limb 03/19/2010  . URI 04/20/2007   Past Surgical History:  Procedure Laterality Date  . ABDOMINAL HYSTERECTOMY    . CHOLECYSTECTOMY    . TUBAL LIGATION      reports that she has  never smoked. She has never used smokeless tobacco. She reports that she does not drink alcohol or use drugs. family history includes Cancer in her mother and sister; Diabetes in her brother; Hypertension in her brother and sister. Allergies  Allergen Reactions  . Alendronate Sodium   . Hydrochlorothiazide W-Triamterene   . Norco [Hydrocodone-Acetaminophen] Nausea Only   Review of Systems  Constitutional: Negative for unusual diaphoresis or night sweats HENT: Negative for ear swelling or discharge Eyes: Negative for worsening visual haziness  Respiratory: Negative for choking and stridor.   Gastrointestinal: Negative for distension or worsening eructation Genitourinary: Negative for retention or change in urine volume.  Musculoskeletal: Negative for other MSK pain or swelling Skin: Negative for color change and worsening wound Neurological: Negative for tremors and numbness other than noted  Psychiatric/Behavioral: Negative for decreased concentration or agitation other than above   All other system neg per pt    Objective:   Physical Exam BP (!) 122/50 (BP Location: Left Arm, Patient Position: Sitting, Cuff Size: Normal)   Pulse 91   Temp 98.1 F (36.7 C) (Oral)   Ht 5\' 7"  (1.702 m)   Wt 127 lb (57.6 kg)   SpO2 99%   BMI 19.89 kg/m  VS noted, frail, elderly, nontoxic Constitutional: Pt appears in no apparent distress HENT: Head: NCAT.  Right Ear: External ear normal.  Left Ear: External ear normal.  Eyes: . Pupils are equal, round, and reactive to light. Conjunctivae and EOM are normal Neck: Normal  range of motion. Neck supple.  Cardiovascular: Normal rate and regular rhythm.   Pulmonary/Chest: Effort normal and breath sounds without rales or wheezing.  Abd:  Soft, NT, ND, + BS - benign exam Neurological: Pt is alert. Not confused , motor grossly intact Skin: Skin is warm. No rash, no LE edema Psychiatric: Pt behavior is normal. No agitation.  No other new findings      Assessment & Plan:

## 2016-03-09 NOTE — Progress Notes (Signed)
Pre visit review using our clinic review tool, if applicable. No additional management support is needed unless otherwise documented below in the visit note. 

## 2016-03-09 NOTE — Patient Instructions (Addendum)
OK to try the OTC Kaopectate as needed for loose stools  Please take all new medication as prescribed - the cholestyramine up to 3 times per day if the kaopectate does not help enough  Please continue all other medications as before, and refills have been done if requested.  Please have the pharmacy call with any other refills you may need.  Please keep your appointments with your specialists as you may have planned  You will be contacted regarding the referral for: Gastroenterology (and you can cancel the appt if you are improved)

## 2016-03-13 DIAGNOSIS — R195 Other fecal abnormalities: Secondary | ICD-10-CM | POA: Insufficient documentation

## 2016-03-13 NOTE — Assessment & Plan Note (Addendum)
Etiology unclear, doubt food related, infectious or med related.  WIll hold on lab evaluation pending s/s, ok to try kaopectate prn otc.,  to f/u any worsening symptoms or concerns, pt daughter adamant she wants GI evaluation, ok for referral but could cancel if symptoms improved, consider cholestyramine tid prn if not improved

## 2016-03-13 NOTE — Assessment & Plan Note (Signed)
stable overall by history and exam, and pt to continue medical treatment as before,  to f/u any worsening symptoms or concerns 

## 2016-03-13 NOTE — Assessment & Plan Note (Signed)
stable overall by history and exam, recent data reviewed with pt, and pt to continue medical treatment as before,  to f/u any worsening symptoms or concerns BP Readings from Last 3 Encounters:  03/09/16 (!) 122/50  08/20/15 120/70  05/11/15 (!) 147/79

## 2016-03-31 ENCOUNTER — Other Ambulatory Visit: Payer: Self-pay | Admitting: Internal Medicine

## 2016-03-31 NOTE — Telephone Encounter (Signed)
Requested Prescriptions   Pending Prescriptions Disp Refills  . polyethylene glycol powder (GLYCOLAX/MIRALAX) powder [Pharmacy Med Name: POLYETHYLENE GLYCOL POW GM] 527 g     Sig: [place] 03/09/16.

## 2016-04-26 ENCOUNTER — Other Ambulatory Visit: Payer: Self-pay | Admitting: Internal Medicine

## 2016-06-18 ENCOUNTER — Other Ambulatory Visit: Payer: Self-pay | Admitting: Internal Medicine

## 2016-06-29 ENCOUNTER — Encounter: Payer: Self-pay | Admitting: Podiatry

## 2016-06-29 ENCOUNTER — Ambulatory Visit (INDEPENDENT_AMBULATORY_CARE_PROVIDER_SITE_OTHER): Payer: Medicare Other | Admitting: Podiatry

## 2016-06-29 DIAGNOSIS — M2040 Other hammer toe(s) (acquired), unspecified foot: Secondary | ICD-10-CM | POA: Diagnosis not present

## 2016-06-29 DIAGNOSIS — L03032 Cellulitis of left toe: Secondary | ICD-10-CM

## 2016-06-29 NOTE — Progress Notes (Signed)
Subjective:    Patient ID: Lynn Howell, female   DOB: 83 y.o.   MRN: [id number]   HPI patient presents stating that she has a very painful corn on the tip of the right third toe and her left second nail is incurvated sore with some distal redness noted and slight distal drainage. Patient states that this is been getting worse recently    ROS      Objective:  Physical Exam neurovascular status intact negative Homans sign was noted with the right third digit showing distal keratotic lesion that's painful when pressed and the nailbed second left showing incurvation pain and redness with distal drainage secondary to the structure of the nailbed with no proximal edema erythema drainage noted     Assessment:    Localized paronychia with damage second nail left and severe corn callus formation with hammertoe third right     Plan:    H&P conditions reviewed and at this point I anesthetized the left second digit with 60 Milligan times like Marcaine mixture I removed all damage nailbed and allowed channel spur medial lateral for drainage. I applied sterile dressing and debrided lesion right third toe and we'll begin soaks and the left and will be seen back to recheck

## 2016-07-27 ENCOUNTER — Ambulatory Visit (INDEPENDENT_AMBULATORY_CARE_PROVIDER_SITE_OTHER): Payer: Medicare Other | Admitting: Internal Medicine

## 2016-07-27 ENCOUNTER — Encounter: Payer: Self-pay | Admitting: Internal Medicine

## 2016-07-27 VITALS — BP 128/70 | HR 86 | Ht 67.0 in | Wt 122.0 lb

## 2016-07-27 DIAGNOSIS — I10 Essential (primary) hypertension: Secondary | ICD-10-CM

## 2016-07-27 DIAGNOSIS — Z0000 Encounter for general adult medical examination without abnormal findings: Secondary | ICD-10-CM

## 2016-07-27 MED ORDER — IRBESARTAN-HYDROCHLOROTHIAZIDE 150-12.5 MG PO TABS: ORAL_TABLET | ORAL | 3 refills | Status: DC

## 2016-07-27 MED ORDER — AMLODIPINE BESYLATE 10 MG PO TABS: 10.0000 mg | ORAL_TABLET | Freq: Every day | ORAL | 3 refills | Status: DC

## 2016-07-27 NOTE — Progress Notes (Signed)
Subjective:    Patient ID: Lynn Howell, female    DOB: 11/05/1924, 83 y.o.   MRN: [id number]  HPI Here for wellness and f/u;  Overall doing ok;  Pt denies Chest pain, worsening SOB, DOE, wheezing, orthopnea, PND, worsening LE edema, palpitations, dizziness or syncope.  Pt denies neurological change such as new headache, facial or extremity weakness.  Pt denies polydipsia, polyuria, or low sugar symptoms. Pt states overall good compliance with treatment and medications, good tolerability, and has been trying to follow appropriate diet.  Pt denies worsening depressive symptoms, suicidal ideation or panic. No fever, night sweats, wt loss, loss of appetite, or other constitutional symptoms.  Pt states good ability with ADL's, has low fall risk, home safety reviewed and adequate, no other significant changes in hearing or vision, and not active with exercise.  More unsteady, slower overall, occasionaly forgetful, refuses cane. Declines dxa  Does not drive, likes the bus more,  Stopped her statin several months ago, tolerated ok, just no longer wants to take.  Stil taking the ASA 325 qd.  Seeing podiaty for bilat foot. Has not seen oncology recently for left leg liposarcoma and does not plan to unless it changed  Denies worsening reflux, abd pain, dysphagia, n/v, bowel change or blood except for occasional constipation improved with prn miralax daily.   [place] labs today, wants to come back next wk Past Medical History:  Diagnosis Date  . ANEMIA-IRON DEFICIENCY 08/01/2007  . ANKLE PAIN, LEFT 01/22/2007  . Arthritis   . BACK PAIN, CHRONIC, INTERMITTENT 02/10/2010  . CERUMEN IMPACTION, RIGHT 04/20/2007  . COLONIC POLYPS, HX OF 09/21/2006  . COMMON MIGRAINE 09/21/2006  . CONSTIPATION, CHRONIC 09/21/2006  . CONTACT DERMATITIS 08/01/2008  . DEGENERATIVE JOINT DISEASE, KNEES, BILATERAL 03/19/2010  . DEPRESSION 08/01/2007  . Hypercholesteremia   . HYPERLIPIDEMIA 08/01/2007  . Hypertension   . HYPERTENSION 09/21/2006  .  KNEE PAIN, BILATERAL 09/15/2008  . Left cervical radiculopathy 03/07/2012  . LEG PAIN, RIGHT 03/03/2008  . 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 LUMBAR RADICULOPATHY, RIGHT 03/19/2010  . NEPHROLITHIASIS, HX OF 09/21/2006  . OSTEOPENIA 12/17/2008  . OSTEOPOROSIS 01/22/2007  . SPINAL STENOSIS 12/17/2008  . Stroke ([place])   . Swelling of limb 03/19/2010  . URI 04/20/2007   Past Surgical History:  Procedure Laterality Date  . ABDOMINAL HYSTERECTOMY    . CHOLECYSTECTOMY    . TUBAL LIGATION      reports that she has never smoked. She has never used smokeless tobacco. She reports that she does not drink alcohol or use drugs. family history includes Cancer in her mother and sister; Diabetes in her brother; Hypertension in her brother and sister. Allergies  Allergen Reactions  . Alendronate Sodium   . Hydrochlorothiazide W-Triamterene   . Norco [Hydrocodone-Acetaminophen] Nausea Only   Current Outpatient Prescriptions on File Prior to Visit  Medication Sig Dispense Refill  . aspirin 325 MG tablet Take 325 mg by mouth daily.      . Calcium Carb-Cholecalciferol (CALCIUM + D3 PO) Take 600 mg by mouth.    . cholestyramine (QUESTRAN) 4 g packet Take 1 packet (4 g total) by mouth 3 (three) times daily as needed. 60 each 5   No current facility-administered medications on file prior to visit.    Review of Systems Constitutional: Negative for other unusual diaphoresis, sweats, appetite or weight changes HENT: Negative for other worsening hearing loss, ear pain, facial swelling, mouth sores or neck stiffness.   Eyes: Negative  for other worsening pain, redness or other visual disturbance.  Respiratory: Negative for other stridor or swelling Cardiovascular: Negative for other palpitations or other chest pain  Gastrointestinal: Negative for worsening diarrhea or loose stools, blood in stool, distention or other pain Genitourinary: Negative for hematuria, flank pain or other change in urine volume.    Musculoskeletal: Negative for myalgias or other joint swelling.  Skin: Negative for other color change, or other wound or worsening drainage.  Neurological: Negative for other syncope or numbness. Hematological: Negative for other adenopathy or swelling Psychiatric/Behavioral: Negative for hallucinations, other worsening agitation, SI, self-injury, or new decreased concentration All other system neg per pt    Objective:   Physical Exam BP 128/70   Pulse 86   Ht 5\' 7"  (1.702 m)   Wt 122 lb (55.3 kg)   SpO2 97%   BMI 19.11 kg/m  VS noted, not ill appaering, but thin, frail Constitutional: Pt is oriented to person, place, and time. Appears well-developed and well-nourished, in no significant distress and comfortable Head: Normocephalic and atraumatic  Eyes: Conjunctivae and EOM are normal. Pupils are equal, round, and reactive to light Right Ear: External ear normal without discharge Left Ear: External ear normal without discharge Nose: Nose without discharge or deformity Mouth/Throat: Oropharynx is without other ulcerations and moist  Neck: Normal range of motion. Neck supple. No JVD present. No tracheal deviation present or significant neck LA or mass Cardiovascular: Normal rate, regular rhythm, normal heart sounds and intact distal pulses.   Pulmonary/Chest: WOB normal and breath sounds without rales or wheezing  Abdominal: Soft. Bowel sounds are normal. NT. No HSM  Musculoskeletal: Normal range of motion. Exhibits no edema Lymphadenopathy: Has no other cervical adenopathy.  Neurological: Pt is alert and oriented to person, place, and time. Pt has normal reflexes. No cranial nerve deficit. Motor grossly intact, Gait intact Skin: Skin is warm and dry. No rash noted or new ulcerations Psychiatric:  Has normal mood and affect. Behavior is normal without agitation No other exam findings Lab Results  Component Value Date   WBC 4.9 12/05/2014   HGB 10.9 (L) 12/05/2014   HCT 33.1 (L)  12/05/2014   PLT 252.0 12/05/2014   GLUCOSE 85 12/05/2014   CHOL 220 (H) 12/05/2014   TRIG 59.0 12/05/2014   HDL 92.50 12/05/2014   LDLDIRECT 134.5 11/01/2012   LDLCALC 116 (H) 12/05/2014   ALT 14 12/05/2014   AST 23 12/05/2014   NA 141 12/05/2014   K 3.9 12/05/2014   CL 105 12/05/2014   CREATININE 1.14 12/05/2014   BUN 19 12/05/2014   CO2 28 12/05/2014   TSH 3.66 12/05/2014       Assessment & Plan:

## 2016-07-27 NOTE — Patient Instructions (Signed)

## 2016-07-29 ENCOUNTER — Other Ambulatory Visit: Payer: Self-pay | Admitting: Internal Medicine

## 2016-07-30 NOTE — Assessment & Plan Note (Signed)

## 2016-07-30 NOTE — Assessment & Plan Note (Signed)
stable overall by history and exam, recent data reviewed with pt, and pt to continue medical treatment as before,  to f/u any worsening symptoms or concerns BP Readings from Last 3 Encounters:  07/27/16 128/70  03/09/16 (!) 122/50  08/20/15 120/70

## 2016-08-02 ENCOUNTER — Telehealth: Payer: Self-pay

## 2016-08-02 ENCOUNTER — Other Ambulatory Visit (INDEPENDENT_AMBULATORY_CARE_PROVIDER_SITE_OTHER): Payer: Medicare Other

## 2016-08-02 ENCOUNTER — Encounter: Payer: Self-pay | Admitting: Internal Medicine

## 2016-08-02 DIAGNOSIS — Z0000 Encounter for general adult medical examination without abnormal findings: Secondary | ICD-10-CM | POA: Diagnosis not present

## 2016-08-02 LAB — BASIC METABOLIC PANEL
BUN: 20 mg/dL (ref 6–23)
CHLORIDE: 105 meq/L (ref 96–112)
CO2: 29 meq/L (ref 19–32)
Calcium: 9.5 mg/dL (ref 8.4–10.5)
Creatinine, Ser: 1.3 mg/dL — ABNORMAL HIGH (ref 0.40–1.20)
GFR: 49.52 mL/min — ABNORMAL LOW (ref >60.00)
Glucose, Bld: 91 mg/dL (ref 70–99)
Potassium: 3.6 mEq/L (ref 3.5–5.1)
SODIUM: 142 meq/L (ref 135–145)

## 2016-08-02 LAB — URINALYSIS, ROUTINE W REFLEX MICROSCOPIC
BILIRUBIN URINE: NEGATIVE
HGB URINE DIPSTICK: NEGATIVE
Ketones, ur: NEGATIVE
Nitrite: NEGATIVE
RBC / HPF: NONE SEEN (ref 0–?)
Specific Gravity, Urine: 1.025 (ref 1.000–1.030)
TOTAL PROTEIN, URINE-UPE24: NEGATIVE
URINE GLUCOSE: NEGATIVE
UROBILINOGEN UA: 0.2 (ref 0.0–1.0)
pH: 5.5 (ref 5.0–8.0)

## 2016-08-02 LAB — HEPATIC FUNCTION PANEL
ALK PHOS: 56 U/L (ref 39–117)
ALT: 8 U/L (ref 0–35)
AST: 18 U/L (ref 0–37)
Albumin: 3.8 g/dL (ref 3.5–5.2)
BILIRUBIN DIRECT: 0.1 mg/dL (ref 0.0–0.3)
TOTAL PROTEIN: 6.4 g/dL (ref 6.0–8.3)
Total Bilirubin: 0.3 mg/dL (ref 0.2–1.2)

## 2016-08-02 LAB — LIPID PANEL
CHOL/HDL RATIO: 2
Cholesterol: 199 mg/dL (ref 0–200)
HDL: 83.7 mg/dL (ref >39.00)
LDL Cholesterol: 91 mg/dL (ref 0–99)
NONHDL: 114.94
Triglycerides: 119 mg/dL (ref 0.0–149.0)
VLDL: 23.8 mg/dL (ref 0.0–40.0)

## 2016-08-02 LAB — TSH: TSH: 2.85 u[IU]/mL (ref 0.35–4.50)

## 2016-08-02 NOTE — Telephone Encounter (Signed)
Lab called stating pt was there for labs but no orders were put in.

## 2016-09-07 ENCOUNTER — Encounter: Payer: Self-pay | Admitting: Family Medicine

## 2016-09-07 ENCOUNTER — Ambulatory Visit (INDEPENDENT_AMBULATORY_CARE_PROVIDER_SITE_OTHER): Payer: Medicare Other | Admitting: Family Medicine

## 2016-09-07 VITALS — BP 110/60 | HR 90 | Temp 98.1°F | Ht 67.0 in | Wt 124.0 lb

## 2016-09-07 DIAGNOSIS — R21 Rash and other nonspecific skin eruption: Secondary | ICD-10-CM | POA: Diagnosis not present

## 2016-09-07 NOTE — Progress Notes (Signed)
Lynn Howell - 83 y.o. female MRN [id number]  Date of birth: 11/05/1924  SUBJECTIVE:  Including CC & ROS.  Chief Complaint  Patient presents with  . Insect Bite    patient states she was working in the yard cleaning up a dead cedar tree and noticed monday that she has two bites that itch really bad   Lynn Howell is an 83 year old female that is presenting with an area itchiness on her back and her thigh. She reports she was working out in her yard earlier this week. She started noticing an area itchiness on her back in her right inguinal region. She denies any specific trigger. She thinks they may be a bite but does not remember getting bit. She is feeling like her normal self. She's not had any fevers, chills, or night sweats.     Review of Systems  Constitutional: Negative for chills, fatigue and fever.  Gastrointestinal: Negative for nausea and vomiting.    HISTORY: Past Medical, Surgical, Social, and Family History Reviewed & Updated per EMR.   Pertinent Historical Findings include:  Past Medical History:  Diagnosis Date  . ANEMIA-IRON DEFICIENCY 08/01/2007  . ANKLE PAIN, LEFT 01/22/2007  . Arthritis   . BACK PAIN, CHRONIC, INTERMITTENT 02/10/2010  . CERUMEN IMPACTION, RIGHT 04/20/2007  . COLONIC POLYPS, HX OF 09/21/2006  . COMMON MIGRAINE 09/21/2006  . CONSTIPATION, CHRONIC 09/21/2006  . CONTACT DERMATITIS 08/01/2008  . DEGENERATIVE JOINT DISEASE, KNEES, BILATERAL 03/19/2010  . DEPRESSION 08/01/2007  . Hypercholesteremia   . HYPERLIPIDEMIA 08/01/2007  . Hypertension   . HYPERTENSION 09/21/2006  . KNEE PAIN, BILATERAL 09/15/2008  . Left cervical radiculopathy 03/07/2012  . LEG PAIN, RIGHT 03/03/2008  . 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 LUMBAR RADICULOPATHY, RIGHT 03/19/2010  . NEPHROLITHIASIS, HX OF 09/21/2006  . OSTEOPENIA 12/17/2008  . OSTEOPOROSIS 01/22/2007  . SPINAL STENOSIS 12/17/2008  . Stroke ([place])   . Swelling of limb 03/19/2010  . URI 04/20/2007    Past Surgical History:    Procedure Laterality Date  . ABDOMINAL HYSTERECTOMY    . CHOLECYSTECTOMY    . TUBAL LIGATION      Allergies  Allergen Reactions  . Alendronate Sodium   . Hydrochlorothiazide W-Triamterene   . Norco [Hydrocodone-Acetaminophen] Nausea Only    Family History  Problem Relation Age of Onset  . Cancer Mother   . Cancer Sister   . Hypertension Sister   . Diabetes Brother   . Hypertension Brother      Social History   Social History  . Marital status: Widowed    Spouse name: N/A  . Number of children: 4  . Years of education: N/A   Occupational History  . Retired    Social History Main Topics  . Smoking status: Never Smoker  . Smokeless tobacco: Never Used  . Alcohol use No  . Drug use: No  . Sexual activity: Not on file   Other Topics Concern  . Not on file   Social History Narrative  . No narrative on file     PHYSICAL EXAM:  VS: BP 110/60 (BP Location: Left Arm, Patient Position: Sitting, Cuff Size: Normal)   Pulse 90   Temp 98.1 F (36.7 C) (Oral)   Ht 5\' 7"  (1.702 m)   Wt 124 lb (56.2 kg)   SpO2 100%   BMI 19.42 kg/m  Physical Exam Gen: NAD, alert, cooperative with exam, ENT: normal lips, normal nasal mucosa,  Eye: normal EOM, normal conjunctiva and lids  CV:  no edema, +2 pedal pulses   Resp: no accessory muscle use, non-labored,  Skin: There is a spot of redness which appears to be where she has been itching in her right thoracic back just distal to the inferior angle of the scapula, area of redness in the right inguinal region with no raised lesions were no bite marks., no areas of induration  Neuro: normal tone, normal sensation to touch Psych:  normal insight, alert and oriented MSK: Normal gait, normal strength      ASSESSMENT & PLAN:   Rash She has a spot on her thoracic back and in her right inguinal region that have some redness but no raised lesions and no bite marks. Most likely some form of mosquito bite. -Advised over-the-counter  hydrocortisone cream - If no improvement could consider Zyrtec or Allegra. - Follow-up as needed.

## 2016-09-07 NOTE — Assessment & Plan Note (Signed)
She has a spot on her thoracic back and in her right inguinal region that have some redness but no raised lesions and no bite marks. Most likely some form of mosquito bite. -Advised over-the-counter hydrocortisone cream - If no improvement could consider Zyrtec or Allegra. - Follow-up as needed.

## 2016-09-07 NOTE — Patient Instructions (Addendum)
Thank you for coming in,   Try rubbing hydrocortisone cream over these areas. You can buy it from over-the-counter. Please follow-up with me if your symptoms do not improve.   Please feel free to call with any questions or concerns at any time, at [phone]. --Dr. Raeford Razor

## 2017-01-26 ENCOUNTER — Encounter: Payer: Self-pay | Admitting: Podiatry

## 2017-01-26 ENCOUNTER — Ambulatory Visit: Payer: PPO | Admitting: Podiatry

## 2017-01-26 DIAGNOSIS — M79674 Pain in right toe(s): Secondary | ICD-10-CM

## 2017-01-26 DIAGNOSIS — M79675 Pain in left toe(s): Secondary | ICD-10-CM | POA: Diagnosis not present

## 2017-01-26 DIAGNOSIS — L84 Corns and callosities: Secondary | ICD-10-CM | POA: Diagnosis not present

## 2017-01-26 DIAGNOSIS — I999 Unspecified disorder of circulatory system: Secondary | ICD-10-CM | POA: Diagnosis not present

## 2017-01-26 DIAGNOSIS — B351 Tinea unguium: Secondary | ICD-10-CM | POA: Diagnosis not present

## 2017-01-26 NOTE — Progress Notes (Signed)
Subjective:   Patient ID: Lynn Howell, female   DOB: 84 y.o.   MRN: [id number]   HPI Patient presents with painful nailbeds 1-5 both feet and keratotic lesion third digit right that's painful when pressed   ROS      Objective:  Physical Exam  Neurovascular status intact with patient found have thick incurvated nailbeds 1-5 both feet that are painful with patient noted to have a keratotic lesion third digit right distal that's painful when palpated     Assessment:  Mycotic nail infection with pain 1-5 both feet with hammertoe deformity with distal keratotic lesion third right     Plan:  H&P all conditions reviewed and debrided nailbeds 1-5 both feet with no iatrogenic bleeding and lesion right third toe with no iatrogenic bleeding. Reappoint for routine care

## 2017-02-27 ENCOUNTER — Other Ambulatory Visit: Payer: Self-pay | Admitting: Internal Medicine

## 2017-03-02 ENCOUNTER — Ambulatory Visit: Payer: Self-pay | Admitting: *Deleted

## 2017-03-02 ENCOUNTER — Ambulatory Visit: Payer: Medicare Other | Admitting: Family Medicine

## 2017-03-02 NOTE — Telephone Encounter (Signed)
Patient called agent crying - she is concerned about her voice- she states she had a dizzy spell this morning and her voice has been "shaky"- changed since. Patient states her voice is the only symptom she is having now- she is not reporting any other symptoms. She states her daughter was with her this morning- but she told her to go to work. She seemed very upset that her voice was still not returned to her normal and wanted to see her PCP. Worked her through protocol and she seemed to calm- appointment given for this afternoon. Patient instructed to press emergency button if she has another dizzy spell. She did not want to call 911 now. (She stated she did not know whether she could make appointment- she is having to stay in the bathroom- that started today as well.) Call to office to inform them of concerns about this patient. Reason for Disposition . [1] Loss of speech or garbled speech AND [2] sudden onset AND [3] brief (now gone)  Answer Assessment - Initial Assessment Questions 1. SYMPTOM: "What is the main symptom you are concerned about?" (e.g., weakness, numbness)     Voice is shaky this morning 2. ONSET: "When did this start?" (minutes, hours, days; while sleeping)     This morning she was doing normal activity- then she had dizzy spell 3. LAST NORMAL: "When was the last time you were normal (no symptoms)?"     This morning eariler 4. PATTERN "Does this come and go, or has it been constant since it started?"  "Is it present now?"     Dizzy spell passed- but voice shakiness is still there 5. CARDIAC SYMPTOMS: "Have you had any of the following symptoms: chest pain, difficulty breathing, palpitations?"     Head did not feel- faint feeling 6. NEUROLOGIC SYMPTOMS: "Have you had any of the following symptoms: headache, dizziness, vision loss, double vision, changes in speech, unsteady on your feet?"     Voice shaky 7. OTHER SYMPTOMS: "Do you have any other symptoms?"     Not now- just voice  shaky 8. PREGNANCY: "Is there any chance you are pregnant?" "When was your last menstrual period?"     n/a  Protocols used: NEUROLOGIC DEFICIT-A-AH

## 2017-03-07 ENCOUNTER — Ambulatory Visit: Payer: Self-pay | Admitting: Family Medicine

## 2017-03-08 ENCOUNTER — Ambulatory Visit (INDEPENDENT_AMBULATORY_CARE_PROVIDER_SITE_OTHER): Payer: PPO | Admitting: Internal Medicine

## 2017-03-08 ENCOUNTER — Encounter: Payer: Self-pay | Admitting: Internal Medicine

## 2017-03-08 VITALS — BP 112/68 | HR 90 | Temp 97.7°F | Ht 67.0 in | Wt 123.0 lb

## 2017-03-08 DIAGNOSIS — R42 Dizziness and giddiness: Secondary | ICD-10-CM

## 2017-03-08 DIAGNOSIS — R195 Other fecal abnormalities: Secondary | ICD-10-CM

## 2017-03-08 DIAGNOSIS — I10 Essential (primary) hypertension: Secondary | ICD-10-CM | POA: Diagnosis not present

## 2017-03-08 DIAGNOSIS — R609 Edema, unspecified: Secondary | ICD-10-CM

## 2017-03-08 DIAGNOSIS — Z0000 Encounter for general adult medical examination without abnormal findings: Secondary | ICD-10-CM | POA: Diagnosis not present

## 2017-03-08 MED ORDER — TELMISARTAN 20 MG PO TABS: 20.0000 mg | ORAL_TABLET | Freq: Every day | ORAL | 3 refills | Status: DC

## 2017-03-08 NOTE — Patient Instructions (Addendum)
OK to decrease the Miralax (polyethylene glycol) to Every Other DAY (not every day)  OK to stop the Avalide (irbesartan HCT)  Please take all new medication as prescribed - micardis 20 mg per day only  Please call if you have continued dizziness or weakness, as you may need the medications reduced further  Please check your Blood Pressure on a regular basis, as LOW blood pressure for you may be less than 120/80 (so you would want to be higher than that)  Please continue all other medications as before, and refills have been done if requested.  Please have the pharmacy call with any other refills you may need.  Please keep your appointments with your specialists as you may have planned  Please return in 3 months, or sooner if needed, with Lab testing done 3-5 days before

## 2017-03-08 NOTE — Progress Notes (Signed)
Subjective:    Patient ID: Lynn Howell, female    DOB: 1925-04-23, 84 y.o.   MRN: [id number]  HPI  Here to f/u episodes start last thurs (6 days) water loose stools possible worse with laxitives, without abd pain, n/v, fever.  Has been taking her HCT wit meds as per usual, and today with c/o dizziness and worsening weakness.  Pt denies chest pain, increased sob or doe, wheezing, orthopnea, PND, increased LE swelling, palpitations, or syncope. Denies worsening reflux, abd pain, dysphagia, n/v, bowel change or blood.   Pt denies polydipsia, polyuria. Denies urinary symptoms such as dysuria, frequency, urgency, flank pain, hematuria or n/v, fever, chills. Past Medical History:  Diagnosis Date  . ANEMIA-IRON DEFICIENCY 08/01/2007  . ANKLE PAIN, LEFT 01/22/2007  . Arthritis   . BACK PAIN, CHRONIC, INTERMITTENT 02/10/2010  . CERUMEN IMPACTION, RIGHT 04/20/2007  . COLONIC POLYPS, HX OF 09/21/2006  . COMMON MIGRAINE 09/21/2006  . CONSTIPATION, CHRONIC 09/21/2006  . CONTACT DERMATITIS 08/01/2008  . DEGENERATIVE JOINT DISEASE, KNEES, BILATERAL 03/19/2010  . DEPRESSION 08/01/2007  . Hypercholesteremia   . HYPERLIPIDEMIA 08/01/2007  . Hypertension   . HYPERTENSION 09/21/2006  . KNEE PAIN, BILATERAL 09/15/2008  . Left cervical radiculopathy 03/07/2012  . LEG PAIN, RIGHT 03/03/2008  . 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 LUMBAR RADICULOPATHY, RIGHT 03/19/2010  . NEPHROLITHIASIS, HX OF 09/21/2006  . OSTEOPENIA 12/17/2008  . OSTEOPOROSIS 01/22/2007  . SPINAL STENOSIS 12/17/2008  . Stroke ([place])   . Swelling of limb 03/19/2010  . URI 04/20/2007   Past Surgical History:  Procedure Laterality Date  . ABDOMINAL HYSTERECTOMY    . CHOLECYSTECTOMY    . TUBAL LIGATION      reports that  has never smoked. she has never used smokeless tobacco. She reports that she does not drink alcohol or use drugs. family history includes Cancer in her mother and sister; Diabetes in her brother; Hypertension in her brother and  sister. Allergies  Allergen Reactions  . Alendronate Sodium   . Hydrochlorothiazide W-Triamterene   . Norco [Hydrocodone-Acetaminophen] Nausea Only   Current Outpatient Medications on File Prior to Visit  Medication Sig Dispense Refill  . amLODipine (NORVASC) 10 MG tablet Take 1 tablet (10 mg total) by mouth daily. 90 tablet 3  . aspirin 325 MG tablet Take 325 mg by mouth daily.      . Calcium Carb-Cholecalciferol (CALCIUM + D3 PO) Take 600 mg by mouth.    . cholestyramine (QUESTRAN) 4 g packet Take 1 packet (4 g total) by mouth 3 (three) times daily as needed. 60 each 5  . polyethylene glycol powder (GLYCOLAX/MIRALAX) powder 17 GRAMS TWICE DAILY 527 g 2   No current facility-administered medications on file prior to visit.    Review of Systems  Constitutional: Negative for other unusual diaphoresis or sweats HENT: Negative for ear discharge or swelling Eyes: Negative for other worsening visual disturbances Respiratory: Negative for stridor or other swelling  Gastrointestinal: Negative for worsening distension or other blood Genitourinary: Negative for retention or other urinary change Musculoskeletal: Negative for other MSK pain or swelling Skin: Negative for color change or other new lesions Neurological: Negative for worsening tremors and other numbness  Psychiatric/Behavioral: Negative for worsening agitation or other fatigue All other system neg per pt    Objective:   Physical Exam BP 112/68   Pulse 90   Temp 97.7 F (36.5 C) (Oral)   Ht 5\' 7"  (1.702 m)   Wt 123 lb (55.8 kg)  SpO2 98%   BMI 19.26 kg/m  VS noted,  Constitutional: Pt appears in NAD HENT: Head: NCAT.  Right Ear: External ear normal.  Left Ear: External ear normal.  Eyes: . Pupils are equal, round, and reactive to light. Conjunctivae and EOM are normal Nose: without d/c or deformity Neck: Neck supple. Gross normal ROM Cardiovascular: Normal rate and regular rhythm.   Pulmonary/Chest: Effort normal  and breath sounds without rales or wheezing.  Abd:  Soft, NT, ND, + BS, no organomegaly Neurological: Pt is alert. At baseline orientation, motor grossly intact Skin: Skin is warm. No rashes, other new lesions, no LE edema Psychiatric: Pt behavior is normal without agitation  No other exam findings    Assessment & Plan:

## 2017-03-11 NOTE — Assessment & Plan Note (Signed)
None currently, to d/c HCT with above and follow

## 2017-03-11 NOTE — Assessment & Plan Note (Signed)
I think related to over laxative use, pt is afeb, exam benign, cont to follow closely for any worsening s/s

## 2017-03-11 NOTE — Assessment & Plan Note (Signed)
Also to d/c avalide (has also been recalled), start micardis 20 qd only, consider decreased amlodipine for persistent symptoms

## 2017-03-11 NOTE — Assessment & Plan Note (Signed)
Likely c/w mild orthostasis with recent diarrhea on laxatives and continuing HCT, to increase po fluids

## 2017-04-03 ENCOUNTER — Encounter: Payer: Self-pay | Admitting: Podiatry

## 2017-04-03 ENCOUNTER — Ambulatory Visit: Payer: PPO

## 2017-04-03 ENCOUNTER — Ambulatory Visit: Payer: PPO | Admitting: Podiatry

## 2017-04-03 DIAGNOSIS — L03032 Cellulitis of left toe: Secondary | ICD-10-CM | POA: Diagnosis not present

## 2017-04-03 DIAGNOSIS — M79675 Pain in left toe(s): Secondary | ICD-10-CM | POA: Diagnosis not present

## 2017-04-03 DIAGNOSIS — M2040 Other hammer toe(s) (acquired), unspecified foot: Secondary | ICD-10-CM | POA: Diagnosis not present

## 2017-04-03 DIAGNOSIS — M79674 Pain in right toe(s): Secondary | ICD-10-CM

## 2017-04-03 DIAGNOSIS — B351 Tinea unguium: Secondary | ICD-10-CM

## 2017-04-03 DIAGNOSIS — L84 Corns and callosities: Secondary | ICD-10-CM

## 2017-04-03 DIAGNOSIS — R52 Pain, unspecified: Secondary | ICD-10-CM

## 2017-04-03 NOTE — Progress Notes (Signed)
Subjective:   Patient ID: Lynn Howell, female   DOB: 84 y.o.   MRN: [id number]   HPI Patient presents stating she is having a lot of pain at the end of her second toe has become inflamed and it is hard for her to wear shoe gear.  States she is tried to pad it and wear wider shoes   ROS      Objective:  Physical Exam  Neurovascular status found to be intact with patient found to have distal keratotic lesion digit to right with thick nail bed that is painful when palpated     Assessment:  Structural changes of the digit along with nail disease and thick callus formation secondary to structure     Plan:  H&P discussed all conditions and at this point I went ahead and I debrided the tissue I applied cushion padding I advised on soaks and explained digital correction if necessary.  Reappoint on an as-needed basis

## 2017-07-25 ENCOUNTER — Encounter: Payer: Self-pay | Admitting: Internal Medicine

## 2017-07-25 ENCOUNTER — Ambulatory Visit (INDEPENDENT_AMBULATORY_CARE_PROVIDER_SITE_OTHER): Payer: PPO | Admitting: Internal Medicine

## 2017-07-25 DIAGNOSIS — R21 Rash and other nonspecific skin eruption: Secondary | ICD-10-CM

## 2017-07-25 MED ORDER — TRIAMCINOLONE ACETONIDE 0.1 % EX CREA: 1.0000 "application " | TOPICAL_CREAM | Freq: Two times a day (BID) | CUTANEOUS | 0 refills | Status: DC

## 2017-07-25 NOTE — Patient Instructions (Signed)
We have sent in triamcinolone cream to use twice a day on the spots on the arms and stomach.

## 2017-07-25 NOTE — Assessment & Plan Note (Signed)
Could be bite from outdoors. Rx for triamcinolone cream to use on the area. Avoid scratching and talked to her about benadryl cream if needed for itching.

## 2017-07-25 NOTE — Progress Notes (Signed)
   Subjective:    Patient ID: Lynn Howell, female    DOB: [birth year], 84 y.o.   MRN: [id number]  HPI The patient is an 84 YO female coming in for itching mostly on her arms and chest. Started about 1 week ago. She has tried an otc cream which she cannot recall the name. She denies fevers or chills. Changed brand of miralax recently but no new medications. Denies change in doses. She has been outside a lot lately but denies specific bites. She has mild burning in the areas more after scratching. She denies pain in the area.   Review of Systems  Constitutional: Negative.   Respiratory: Negative for cough, chest tightness and shortness of breath.   Cardiovascular: Negative for chest pain, palpitations and leg swelling.  Gastrointestinal: Negative for abdominal distention, abdominal pain, constipation, diarrhea, nausea and vomiting.  Musculoskeletal: Negative.   Skin: Positive for rash.  Neurological: Negative.       Objective:   Physical Exam  Constitutional: She is oriented to person, place, and time. She appears well-developed and well-nourished.  HENT:  Head: Normocephalic and atraumatic.  Eyes: EOM are normal.  Neck: Normal range of motion.  Cardiovascular: Normal rate and regular rhythm.  Pulmonary/Chest: Effort normal and breath sounds normal. No respiratory distress. She has no wheezes. She has no rales.  Abdominal: Soft. She exhibits no distension. There is no tenderness. There is no rebound.  Musculoskeletal: She exhibits no edema.  Neurological: She is alert and oriented to person, place, and time. Coordination normal.  Skin: Skin is warm and dry. Rash noted.  Red macular lesion on the arms in the elbow crease bilaterally with some stigmata of scratching. Area on the right stomach with area of redness around small macular lesion, no satellite lesion.    Vitals:   07/25/17 0811  BP: 102/60  Pulse: 86  Temp: 98.6 F (37 C)  TempSrc: Oral  SpO2: 99%  Weight: 120 lb  (54.4 kg)  Height: 5\' 7"  (1.702 m)      Assessment & Plan:

## 2017-09-11 ENCOUNTER — Other Ambulatory Visit: Payer: Self-pay | Admitting: Internal Medicine

## 2017-09-14 ENCOUNTER — Encounter: Payer: Self-pay | Admitting: Internal Medicine

## 2017-09-14 ENCOUNTER — Other Ambulatory Visit (INDEPENDENT_AMBULATORY_CARE_PROVIDER_SITE_OTHER): Payer: PPO

## 2017-09-14 ENCOUNTER — Ambulatory Visit (INDEPENDENT_AMBULATORY_CARE_PROVIDER_SITE_OTHER)
Admission: RE | Admit: 2017-09-14 | Discharge: 2017-09-14 | Disposition: A | Discharge status: Home / Self Care | Payer: PPO | Source: Ambulatory Visit | Attending: Internal Medicine | Admitting: Internal Medicine

## 2017-09-14 ENCOUNTER — Ambulatory Visit (INDEPENDENT_AMBULATORY_CARE_PROVIDER_SITE_OTHER): Payer: PPO | Admitting: Internal Medicine

## 2017-09-14 VITALS — BP 120/70 | HR 96 | Temp 97.6°F | Ht 67.0 in | Wt 118.0 lb

## 2017-09-14 DIAGNOSIS — S3992XA Unspecified injury of lower back, initial encounter: Secondary | ICD-10-CM | POA: Diagnosis not present

## 2017-09-14 DIAGNOSIS — M545 Low back pain, unspecified: Secondary | ICD-10-CM | POA: Insufficient documentation

## 2017-09-14 DIAGNOSIS — Z0000 Encounter for general adult medical examination without abnormal findings: Secondary | ICD-10-CM

## 2017-09-14 DIAGNOSIS — M160 Bilateral primary osteoarthritis of hip: Secondary | ICD-10-CM | POA: Diagnosis not present

## 2017-09-14 DIAGNOSIS — R269 Unspecified abnormalities of gait and mobility: Secondary | ICD-10-CM

## 2017-09-14 LAB — BASIC METABOLIC PANEL
BUN: 20 mg/dL (ref 6–23)
CHLORIDE: 103 meq/L (ref 96–112)
CO2: 29 mEq/L (ref 19–32)
Calcium: 9.9 mg/dL (ref 8.4–10.5)
Creatinine, Ser: 1.2 mg/dL (ref 0.40–1.20)
GFR: 54.29 mL/min — AB (ref >60.00)
Glucose, Bld: 97 mg/dL (ref 70–99)
Potassium: 3.4 mEq/L — ABNORMAL LOW (ref 3.5–5.1)
SODIUM: 140 meq/L (ref 135–145)

## 2017-09-14 LAB — HEPATIC FUNCTION PANEL
ALBUMIN: 4.1 g/dL (ref 3.5–5.2)
ALT: 14 U/L (ref 0–35)
AST: 27 U/L (ref 0–37)
Alkaline Phosphatase: 59 U/L (ref 39–117)
BILIRUBIN DIRECT: 0.1 mg/dL (ref 0.0–0.3)
TOTAL PROTEIN: 7.1 g/dL (ref 6.0–8.3)
Total Bilirubin: 0.6 mg/dL (ref 0.2–1.2)

## 2017-09-14 LAB — TSH: TSH: 2.92 u[IU]/mL (ref 0.35–4.50)

## 2017-09-14 LAB — CBC WITH DIFFERENTIAL/PLATELET
BASOS ABS: 0.1 10*3/uL (ref 0.0–0.1)
Basophils Relative: 1.1 % (ref 0.0–3.0)
EOS PCT: 0.3 % (ref 0.0–5.0)
Eosinophils Absolute: 0 10*3/uL (ref 0.0–0.7)
HEMATOCRIT: 30.1 % — AB (ref 36.0–46.0)
Hemoglobin: 9.9 g/dL — ABNORMAL LOW (ref 12.0–15.0)
LYMPHS PCT: 25 % (ref 12.0–46.0)
Lymphs Abs: 1.3 10*3/uL (ref 0.7–4.0)
MCHC: 33 g/dL (ref 30.0–36.0)
MCV: 90.4 fl (ref 78.0–100.0)
MONOS PCT: 5.8 % (ref 3.0–12.0)
Monocytes Absolute: 0.3 10*3/uL (ref 0.1–1.0)
NEUTROS ABS: 3.6 10*3/uL (ref 1.4–7.7)
Neutrophils Relative %: 67.8 % (ref 43.0–77.0)
PLATELETS: 215 10*3/uL (ref 150.0–400.0)
RBC: 3.33 Mil/uL — AB (ref 3.87–5.11)
RDW: 14.5 % (ref 11.5–15.5)
WBC: 5.3 10*3/uL (ref 4.0–10.5)

## 2017-09-14 LAB — LIPID PANEL
CHOLESTEROL: 200 mg/dL (ref 0–200)
HDL: 91.4 mg/dL (ref >39.00)
LDL CALC: 99 mg/dL (ref 0–99)
NonHDL: 108.47
TRIGLYCERIDES: 46 mg/dL (ref 0.0–149.0)
Total CHOL/HDL Ratio: 2
VLDL: 9.2 mg/dL (ref 0.0–40.0)

## 2017-09-14 MED ORDER — TIZANIDINE HCL 2 MG PO TABS: 2.0000 mg | ORAL_TABLET | Freq: Four times a day (QID) | ORAL | 0 refills | Status: DC | PRN

## 2017-09-14 NOTE — Progress Notes (Signed)
Subjective:    Patient ID: Lynn Howell, female    DOB: [birth year], 84 y.o.   MRN: [id number]  HPI  Here for wellness and f/u;  Overall doing ok;  Pt denies Chest pain, worsening SOB, DOE, wheezing, orthopnea, PND, worsening LE edema, palpitations, dizziness or syncope.  Pt denies neurological change such as new headache, facial or extremity weakness.  Pt denies polydipsia, polyuria, or low sugar symptoms. Pt states overall good compliance with treatment and medications, good tolerability, and has been trying to follow appropriate diet.  Pt denies worsening depressive symptoms, suicidal ideation or panic. No fever, night sweats, wt loss, loss of appetite, or other constitutional symptoms.  Pt states good ability with ADL's, home safety reviewed and adequate, no other significant changes in hearing or vision, and only occasionally active with exercise.  Fell x 1 yesterday after lost balance near the closet and sort of slid down the wall. Has had some increase balance and walking difficulty as well.  Today has some mild left lower back ache persistent since the fall, but no bowel or bladder change, fever, wt loss,  worsening LE pain/numbness/weakness. Past Medical History:  Diagnosis Date  . ANEMIA-IRON DEFICIENCY 08/01/2007  . ANKLE PAIN, LEFT 01/22/2007  . Arthritis   . BACK PAIN, CHRONIC, INTERMITTENT 02/10/2010  . CERUMEN IMPACTION, RIGHT 04/20/2007  . COLONIC POLYPS, HX OF 09/21/2006  . COMMON MIGRAINE 09/21/2006  . CONSTIPATION, CHRONIC 09/21/2006  . CONTACT DERMATITIS 08/01/2008  . DEGENERATIVE JOINT DISEASE, KNEES, BILATERAL 03/19/2010  . DEPRESSION 08/01/2007  . Hypercholesteremia   . HYPERLIPIDEMIA 08/01/2007  . Hypertension   . HYPERTENSION 09/21/2006  . KNEE PAIN, BILATERAL 09/15/2008  . Left cervical radiculopathy 03/07/2012  . LEG PAIN, RIGHT 03/03/2008  . 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 LUMBAR RADICULOPATHY, RIGHT 03/19/2010  . NEPHROLITHIASIS, HX OF 09/21/2006  . OSTEOPENIA  12/17/2008  . OSTEOPOROSIS 01/22/2007  . SPINAL STENOSIS 12/17/2008  . Stroke ([place])   . Swelling of limb 03/19/2010  . URI 04/20/2007   Past Surgical History:  Procedure Laterality Date  . ABDOMINAL HYSTERECTOMY    . CHOLECYSTECTOMY    . TUBAL LIGATION      reports that she has never smoked. She has never used smokeless tobacco. She reports that she does not drink alcohol or use drugs. family history includes Cancer in her mother and sister; Diabetes in her brother; Hypertension in her brother and sister. Allergies  Allergen Reactions  . Alendronate Sodium   . Hydrochlorothiazide W-Triamterene   . Norco [Hydrocodone-Acetaminophen] Nausea Only   Current Outpatient Medications on File Prior to Visit  Medication Sig Dispense Refill  . amLODipine (NORVASC) 10 MG tablet TAKE ONE TABLET EACH DAY 90 tablet 1  . aspirin 325 MG tablet Take 325 mg by mouth daily.      . Calcium Carb-Cholecalciferol (CALCIUM + D3 PO) Take 600 mg by mouth.    . cholestyramine (QUESTRAN) 4 g packet Take 1 packet (4 g total) by mouth 3 (three) times daily as needed. 60 each 5  . polyethylene glycol powder (GLYCOLAX/MIRALAX) powder 17 GRAMS TWICE DAILY 527 g 2  . telmisartan (MICARDIS) 20 MG tablet Take 1 tablet (20 mg total) by mouth daily. 90 tablet 3  . triamcinolone cream (KENALOG) 0.1 % Apply 1 application topically 2 (two) times daily. 30 g 0   No current facility-administered medications on file prior to visit.    Review of Systems  Constitutional: Negative for other unusual diaphoresis or sweats  HENT: Negative for ear discharge or swelling Eyes: Negative for other worsening visual disturbances Respiratory: Negative for stridor or other swelling  Gastrointestinal: Negative for worsening distension or other blood Genitourinary: Negative for retention or other urinary change Musculoskeletal: Negative for other MSK pain or swelling Skin: Negative for color change or other new lesions Neurological: Negative for  worsening tremors and other numbness  Psychiatric/Behavioral: Negative for worsening agitation or other fatigue All other system neg per pt    Objective:   Physical Exam BP 120/70   Pulse 96   Temp 97.6 F (36.4 C) (Oral)   Ht 5\' 7"  (1.702 m)   Wt 118 lb (53.5 kg)   SpO2 99%   BMI 18.48 kg/m  VS noted,  Constitutional: Pt is oriented to person, place, and time. Appears well-developed and well-nourished, in no significant distress and comfortable Head: Normocephalic and atraumatic  Eyes: Conjunctivae and EOM are normal. Pupils are equal, round, and reactive to light Right Ear: External ear normal without discharge Left Ear: External ear normal without discharge Nose: Nose without discharge or deformity Mouth/Throat: Oropharynx is without other ulcerations and moist  Neck: Normal range of motion. Neck supple. No JVD present. No tracheal deviation present or significant neck LA or mass Cardiovascular: Normal rate, regular rhythm, normal heart sounds and intact distal pulses Pulmonary/Chest: WOB normal and breath sounds without rales or wheezing  Abdominal: Soft. Bowel sounds are normal. NT. No HSM  Musculoskeletal: Normal range of motion. Exhibits no edema, has tender area left lumbar paravertebral without bruising or swelling Lymphadenopathy: Has no other cervical adenopathy.  Neurological: Pt is alert and oriented to person, place, and time. Pt has normal reflexes. No cranial nerve deficit. Motor grossly intact, Gait intact Skin: Skin is warm and dry. No rash noted or new ulcerations Psychiatric:  Has normal mood and affect. Behavior is normal without agitation No other exam findings Lab Results  Component Value Date   WBC 5.3 09/14/2017   HGB 9.9 (L) 09/14/2017   HCT 30.1 (L) 09/14/2017   PLT 215.0 09/14/2017   GLUCOSE 97 09/14/2017   CHOL 200 09/14/2017   TRIG 46.0 09/14/2017   HDL 91.40 09/14/2017   LDLDIRECT 134.5 11/01/2012   LDLCALC 99 09/14/2017   ALT 14 09/14/2017     AST 27 09/14/2017   NA 140 09/14/2017   K 3.4 (L) 09/14/2017   CL 103 09/14/2017   CREATININE 1.20 09/14/2017   BUN 20 09/14/2017   CO2 29 09/14/2017   TSH 2.92 09/14/2017          Assessment & Plan:

## 2017-09-14 NOTE — Patient Instructions (Signed)
Please take all new medication as prescribed - the muscle relaxer  Please continue all other medications as before, and refills have been done if requested.  Please have the pharmacy call with any other refills you may need.  Please continue your efforts at being more active, low cholesterol diet, and weight control.  You are otherwise up to date with prevention measures today.  Please keep your appointments with your specialists as you may have planned  You will be contacted regarding the referral for: Physical Therapy (outpatient)  Please go to the XRAY Department in the Basement (go straight as you get off the elevator) for the x-ray testing  Please go to the LAB in the Basement (turn left off the elevator) for the tests to be done today  You will be contacted by phone if any changes need to be made immediately.  Otherwise, you will receive a letter about your results with an explanation, but please check with MyChart first.  Please remember to sign up for MyChart if you have not done so, as this will be important to you in the future with finding out test results, communicating by private email, and scheduling acute appointments online when needed.  Please return in 6 months, or sooner if needed

## 2017-09-15 ENCOUNTER — Encounter: Payer: Self-pay | Admitting: Internal Medicine

## 2017-09-15 ENCOUNTER — Telehealth: Payer: Self-pay

## 2017-09-15 ENCOUNTER — Other Ambulatory Visit (INDEPENDENT_AMBULATORY_CARE_PROVIDER_SITE_OTHER): Payer: PPO

## 2017-09-15 DIAGNOSIS — Z0000 Encounter for general adult medical examination without abnormal findings: Secondary | ICD-10-CM | POA: Diagnosis not present

## 2017-09-15 LAB — URINALYSIS, ROUTINE W REFLEX MICROSCOPIC
Bilirubin Urine: NEGATIVE
Hgb urine dipstick: NEGATIVE
Ketones, ur: NEGATIVE
Leukocytes, UA: NEGATIVE
Nitrite: NEGATIVE
SPECIFIC GRAVITY, URINE: 1.025 (ref 1.000–1.030)
Total Protein, Urine: NEGATIVE
URINE GLUCOSE: NEGATIVE
Urobilinogen, UA: 0.2 (ref 0.0–1.0)
pH: 5.5 (ref 5.0–8.0)

## 2017-09-15 NOTE — Telephone Encounter (Signed)
-----   Message from Biagio Borg, MD sent at 09/15/2017  8:08 AM EDT ----- Left message on MyChart, pt to cont same tx except  The test results show that your current treatment is OK, as the xray was ok except a possible very small fracture of the tailbone.  This should heal in time, but will take at least 6 weeks.  Please continue your treatment, and let us know if you need further treatment for the pain.  Also the hip and pelvis xrays were negative for any fracture, but did show mild arthritis.Redmond Baseman to please inform pt,

## 2017-09-15 NOTE — Telephone Encounter (Signed)
Pt has been informed of results and expressed understanding.  °

## 2017-09-16 ENCOUNTER — Encounter: Payer: Self-pay | Admitting: Internal Medicine

## 2017-09-16 NOTE — Assessment & Plan Note (Signed)

## 2017-09-16 NOTE — Assessment & Plan Note (Signed)
Also for PT evaluation,  to f/u any worsening symptoms or concerns

## 2017-09-16 NOTE — Assessment & Plan Note (Signed)
Mild to mod, for muscle relaxer prn, for films as ordered r/o fx

## 2017-09-21 ENCOUNTER — Ambulatory Visit: Payer: PPO | Admitting: Podiatry

## 2017-09-21 ENCOUNTER — Encounter: Payer: Self-pay | Admitting: Podiatry

## 2017-09-21 DIAGNOSIS — M79674 Pain in right toe(s): Secondary | ICD-10-CM | POA: Diagnosis not present

## 2017-09-21 DIAGNOSIS — B351 Tinea unguium: Secondary | ICD-10-CM

## 2017-09-21 DIAGNOSIS — M79675 Pain in left toe(s): Secondary | ICD-10-CM | POA: Diagnosis not present

## 2017-09-21 DIAGNOSIS — I999 Unspecified disorder of circulatory system: Secondary | ICD-10-CM | POA: Diagnosis not present

## 2017-09-21 DIAGNOSIS — L84 Corns and callosities: Secondary | ICD-10-CM

## 2017-09-24 NOTE — Progress Notes (Signed)
Subjective:   Patient ID: Lynn Howell, female   DOB: 84 y.o.   MRN: [id number]   HPI Patient presents with nail disease 1-5 both feet that she cannot cut and become painful with shoe gear   ROS      Objective:  Physical Exam  Neurovascular status intact with thick yellow brittle nailbeds 1-5 both feet that are painful     Assessment:  Mycotic nail infection with pain 1-5 both feet     Plan:  Debride painful nailbeds 1-5 both feet with no iatrogenic bleeding noted

## 2017-09-27 ENCOUNTER — Other Ambulatory Visit: Payer: Self-pay | Admitting: Internal Medicine

## 2017-09-29 ENCOUNTER — Other Ambulatory Visit (INDEPENDENT_AMBULATORY_CARE_PROVIDER_SITE_OTHER): Payer: PPO

## 2017-09-29 ENCOUNTER — Encounter: Payer: Self-pay | Admitting: Internal Medicine

## 2017-09-29 ENCOUNTER — Ambulatory Visit (INDEPENDENT_AMBULATORY_CARE_PROVIDER_SITE_OTHER): Payer: PPO | Admitting: Internal Medicine

## 2017-09-29 VITALS — BP 118/72 | HR 93 | Temp 98.4°F | Ht 67.0 in | Wt 119.0 lb

## 2017-09-29 DIAGNOSIS — D649 Anemia, unspecified: Secondary | ICD-10-CM

## 2017-09-29 DIAGNOSIS — M25562 Pain in left knee: Secondary | ICD-10-CM | POA: Diagnosis not present

## 2017-09-29 DIAGNOSIS — M25561 Pain in right knee: Secondary | ICD-10-CM

## 2017-09-29 DIAGNOSIS — R269 Unspecified abnormalities of gait and mobility: Secondary | ICD-10-CM | POA: Diagnosis not present

## 2017-09-29 LAB — CBC WITH DIFFERENTIAL/PLATELET
BASOS PCT: 1.2 % (ref 0.0–3.0)
Basophils Absolute: 0 10*3/uL (ref 0.0–0.1)
EOS PCT: 0.4 % (ref 0.0–5.0)
Eosinophils Absolute: 0 10*3/uL (ref 0.0–0.7)
HEMATOCRIT: 29.3 % — AB (ref 36.0–46.0)
HEMOGLOBIN: 9.8 g/dL — AB (ref 12.0–15.0)
Lymphocytes Relative: 26.7 % (ref 12.0–46.0)
Lymphs Abs: 1.1 10*3/uL (ref 0.7–4.0)
MCHC: 33.4 g/dL (ref 30.0–36.0)
MCV: 90.6 fl (ref 78.0–100.0)
MONO ABS: 0.2 10*3/uL (ref 0.1–1.0)
MONOS PCT: 5.6 % (ref 3.0–12.0)
Neutro Abs: 2.7 10*3/uL (ref 1.4–7.7)
Neutrophils Relative %: 66.1 % (ref 43.0–77.0)
Platelets: 277 10*3/uL (ref 150.0–400.0)
RBC: 3.24 Mil/uL — ABNORMAL LOW (ref 3.87–5.11)
RDW: 14.6 % (ref 11.5–15.5)
WBC: 4.1 10*3/uL (ref 4.0–10.5)

## 2017-09-29 LAB — IBC PANEL
Iron: 42 ug/dL (ref 42–145)
Saturation Ratios: 10.8 % — ABNORMAL LOW (ref 20.0–50.0)
Transferrin: 277 mg/dL (ref 212.0–360.0)

## 2017-09-29 MED ORDER — TIZANIDINE HCL 2 MG PO TABS: 2.0000 mg | ORAL_TABLET | Freq: Four times a day (QID) | ORAL | 3 refills | Status: DC | PRN

## 2017-09-29 NOTE — Assessment & Plan Note (Signed)
For f/u lab today as hgb ? Trending downwards last visit

## 2017-09-29 NOTE — Progress Notes (Signed)
Subjective:    Patient ID: Lynn Howell, female    DOB: 25-Jan-1925, 84 y.o.   MRN: [id number]  HPI here with son who helps with history, though has no recent falls, she is becoming a bit more unstable since last visit with worsening bilat knee pain and off balance it seems, without swelling, giveaways so far.  Has fallen several times in the past without injury.  Has not yet been able to start outpatient PT.  Pt denies chest pain, increased sob or doe, wheezing, orthopnea, PND, increased LE swelling, palpitations, dizziness or syncope. No overt bleeding.  Pt denies polydipsia, polyuria Past Medical History:  Diagnosis Date  . ANEMIA-IRON DEFICIENCY 08/01/2007  . ANKLE PAIN, LEFT 01/22/2007  . Arthritis   . BACK PAIN, CHRONIC, INTERMITTENT 02/10/2010  . CERUMEN IMPACTION, RIGHT 04/20/2007  . COLONIC POLYPS, HX OF 09/21/2006  . COMMON MIGRAINE 09/21/2006  . CONSTIPATION, CHRONIC 09/21/2006  . CONTACT DERMATITIS 08/01/2008  . DEGENERATIVE JOINT DISEASE, KNEES, BILATERAL 03/19/2010  . DEPRESSION 08/01/2007  . Hypercholesteremia   . HYPERLIPIDEMIA 08/01/2007  . Hypertension   . HYPERTENSION 09/21/2006  . KNEE PAIN, BILATERAL 09/15/2008  . Left cervical radiculopathy 03/07/2012  . LEG PAIN, RIGHT 03/03/2008  . 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 LUMBAR RADICULOPATHY, RIGHT 03/19/2010  . NEPHROLITHIASIS, HX OF 09/21/2006  . OSTEOPENIA 12/17/2008  . OSTEOPOROSIS 01/22/2007  . SPINAL STENOSIS 12/17/2008  . Stroke ([place])   . Swelling of limb 03/19/2010  . URI 04/20/2007   Past Surgical History:  Procedure Laterality Date  . ABDOMINAL HYSTERECTOMY    . CHOLECYSTECTOMY    . TUBAL LIGATION      reports that she has never smoked. She has never used smokeless tobacco. She reports that she does not drink alcohol or use drugs. family history includes Cancer in her mother and sister; Diabetes in her brother; Hypertension in her brother and sister. Allergies  Allergen Reactions  . Alendronate Sodium   .  Hydrochlorothiazide W-Triamterene   . Norco [Hydrocodone-Acetaminophen] Nausea Only   Current Outpatient Medications on File Prior to Visit  Medication Sig Dispense Refill  . amLODipine (NORVASC) 10 MG tablet TAKE ONE TABLET EACH DAY 90 tablet 1  . aspirin 325 MG tablet Take 325 mg by mouth daily.      . Calcium Carb-Cholecalciferol (CALCIUM + D3 PO) Take 600 mg by mouth.    . cholestyramine (QUESTRAN) 4 g packet Take 1 packet (4 g total) by mouth 3 (three) times daily as needed. 60 each 5  . irbesartan-hydrochlorothiazide (AVALIDE) 150-12.5 MG tablet TAKE ONE TABLET EACH DAY 90 tablet 3  . polyethylene glycol powder (GLYCOLAX/MIRALAX) powder 17 GRAMS TWICE DAILY 527 g 2  . telmisartan (MICARDIS) 20 MG tablet Take 1 tablet (20 mg total) by mouth daily. 90 tablet 3  . triamcinolone cream (KENALOG) 0.1 % Apply 1 application topically 2 (two) times daily. 30 g 0   No current facility-administered medications on file prior to visit.    Review of Systems  Constitutional: Negative for other unusual diaphoresis or sweats HENT: Negative for ear discharge or swelling Eyes: Negative for other worsening visual disturbances Respiratory: Negative for stridor or other swelling  Gastrointestinal: Negative for worsening distension or other blood Genitourinary: Negative for retention or other urinary change Musculoskeletal: Negative for other MSK pain or swelling Skin: Negative for color change or other new lesions Neurological: Negative for worsening tremors and other numbness  Psychiatric/Behavioral: Negative for worsening agitation or other fatigue All  other system neg per pt    Objective:   Physical Exam BP 118/72   Pulse 93   Temp 98.4 F (36.9 C) (Oral)   Ht 5\' 7"  (1.702 m)   Wt 119 lb (54 kg)   SpO2 98%   BMI 18.64 kg/m  VS noted, not ill appearing Constitutional: Pt appears in NAD HENT: Head: NCAT.  Right Ear: External ear normal.  Left Ear: External ear normal.  Eyes: . Pupils  are equal, round, and reactive to light. Conjunctivae and EOM are normal Nose: without d/c or deformity Neck: Neck supple. Gross normal ROM Cardiovascular: Normal rate and regular rhythm.   Pulmonary/Chest: Effort normal and breath sounds without rales or wheezing.  Abd:  Soft, NT, ND, + BS, no organomegaly Neurological: Pt is alert. At baseline orientation, motor grossly intact Skin: Skin is warm. No rashes, other new lesions, no LE edema Psychiatric: Pt behavior is normal without agitation  No other exam findings Lab Results  Component Value Date   WBC 5.3 09/14/2017   HGB 9.9 (L) 09/14/2017   HCT 30.1 (L) 09/14/2017   PLT 215.0 09/14/2017   GLUCOSE 97 09/14/2017   CHOL 200 09/14/2017   TRIG 46.0 09/14/2017   HDL 91.40 09/14/2017   LDLDIRECT 134.5 11/01/2012   LDLCALC 99 09/14/2017   ALT 14 09/14/2017   AST 27 09/14/2017   NA 140 09/14/2017   K 3.4 (L) 09/14/2017   CL 103 09/14/2017   CREATININE 1.20 09/14/2017   BUN 20 09/14/2017   CO2 29 09/14/2017   TSH 2.92 09/14/2017       Assessment & Plan:

## 2017-09-29 NOTE — Assessment & Plan Note (Signed)
Suspect underlying DJD, for tylenol prn, refilled tizanidine which does seem to help; also to see Sport Medicine for further consideration

## 2017-09-29 NOTE — Patient Instructions (Signed)
You are given the prescription for the rolling walker  Please start Physical Therapy as you are able  Please make an appointment with Sports Medicine in this office for the knee pain as you leave today  Please continue all other medications as before, and refills have been done if requested - the muscle relaxer  Please have the pharmacy call with any other refills you may need.  Please keep your appointments with your specialists as you may have planned  Please go to the LAB in the Basement (turn left off the elevator) for the tests to be done today  You will be contacted by phone if any changes need to be made immediately.  Otherwise, you will receive a letter about your results with an explanation, but please check with MyChart first.  Please remember to sign up for MyChart if you have not done so, as this will be important to you in the future with finding out test results, communicating by private email, and scheduling acute appointments online when needed.

## 2017-09-29 NOTE — Assessment & Plan Note (Signed)
With hx of recent falls, for rolling walker, to start PT as able

## 2017-11-10 ENCOUNTER — Ambulatory Visit: Payer: PPO | Attending: Internal Medicine | Admitting: Rehabilitative and Restorative Service Providers"

## 2017-11-10 ENCOUNTER — Encounter: Payer: Self-pay | Admitting: Family

## 2017-11-10 ENCOUNTER — Other Ambulatory Visit (INDEPENDENT_AMBULATORY_CARE_PROVIDER_SITE_OTHER): Payer: PPO

## 2017-11-10 ENCOUNTER — Ambulatory Visit: Payer: Self-pay | Admitting: *Deleted

## 2017-11-10 ENCOUNTER — Telehealth: Payer: Self-pay | Admitting: Internal Medicine

## 2017-11-10 ENCOUNTER — Ambulatory Visit (INDEPENDENT_AMBULATORY_CARE_PROVIDER_SITE_OTHER): Payer: PPO | Admitting: Family

## 2017-11-10 ENCOUNTER — Telehealth: Payer: Self-pay | Admitting: Rehabilitative and Restorative Service Providers"

## 2017-11-10 VITALS — BP 130/70

## 2017-11-10 VITALS — BP 122/60 | HR 89 | Temp 99.2°F | Ht 67.0 in

## 2017-11-10 DIAGNOSIS — R2689 Other abnormalities of gait and mobility: Secondary | ICD-10-CM | POA: Insufficient documentation

## 2017-11-10 DIAGNOSIS — R2681 Unsteadiness on feet: Secondary | ICD-10-CM

## 2017-11-10 DIAGNOSIS — D649 Anemia, unspecified: Secondary | ICD-10-CM | POA: Diagnosis not present

## 2017-11-10 DIAGNOSIS — D509 Iron deficiency anemia, unspecified: Secondary | ICD-10-CM

## 2017-11-10 DIAGNOSIS — R2242 Localized swelling, mass and lump, left lower limb: Secondary | ICD-10-CM

## 2017-11-10 DIAGNOSIS — M6281 Muscle weakness (generalized): Secondary | ICD-10-CM | POA: Diagnosis not present

## 2017-11-10 LAB — CBC WITH DIFFERENTIAL/PLATELET
BASOS PCT: 1.2 % (ref 0.0–3.0)
Basophils Absolute: 0.1 10*3/uL (ref 0.0–0.1)
EOS ABS: 0 10*3/uL (ref 0.0–0.7)
Eosinophils Relative: 0.8 % (ref 0.0–5.0)
HCT: 29.2 % — ABNORMAL LOW (ref 36.0–46.0)
HEMOGLOBIN: 9.8 g/dL — AB (ref 12.0–15.0)
Lymphocytes Relative: 28.3 % (ref 12.0–46.0)
Lymphs Abs: 1.3 10*3/uL (ref 0.7–4.0)
MCHC: 33.6 g/dL (ref 30.0–36.0)
MCV: 89.7 fl (ref 78.0–100.0)
MONO ABS: 0.4 10*3/uL (ref 0.1–1.0)
Monocytes Relative: 8.7 % (ref 3.0–12.0)
NEUTROS ABS: 2.8 10*3/uL (ref 1.4–7.7)
NEUTROS PCT: 61 % (ref 43.0–77.0)
PLATELETS: 238 10*3/uL (ref 150.0–400.0)
RBC: 3.25 Mil/uL — ABNORMAL LOW (ref 3.87–5.11)
RDW: 14.5 % (ref 11.5–15.5)
WBC: 4.5 10*3/uL (ref 4.0–10.5)

## 2017-11-10 LAB — BASIC METABOLIC PANEL
BUN: 20 mg/dL (ref 6–23)
CHLORIDE: 105 meq/L (ref 96–112)
CO2: 28 mEq/L (ref 19–32)
CREATININE: 1.25 mg/dL — AB (ref 0.40–1.20)
Calcium: 9.5 mg/dL (ref 8.4–10.5)
GFR: 51.78 mL/min — ABNORMAL LOW (ref >60.00)
Glucose, Bld: 105 mg/dL — ABNORMAL HIGH (ref 70–99)
Potassium: 3.6 mEq/L (ref 3.5–5.1)
Sodium: 142 mEq/L (ref 135–145)

## 2017-11-10 NOTE — Telephone Encounter (Signed)
PT contacted MD office due to new onset of R arm weakness since Friday, report of vision changes over the past 3-4 months (denies double vision, but reports "I lose the words"), and L thigh is significantly swollen.  Her son notes these issues have not been evaluated.  PT spoke with front office and they took a message for triage nurse to return call to patient's son, Eden Lathe.  Karinna Beadles, PT

## 2017-11-10 NOTE — Telephone Encounter (Signed)
Copied from [place] [id number]. Topic: Quick Communication - See Telephone Encounter >> Nov 10, 2017 11:04 AM Ahmed Prima L wrote: CRM for notification. See Telephone encounter for: 11/10/17.   Christine with [facility] called and said she saw her today :: She has had a couple of things changed since she seen Dr Jenny Reichmann last. She said that her left thigh is swelling ( sending her to triage ) 52 cm and the right thigh is 44 cm. She has upper weakness compared to the left side ( this is new ) She reports to Altha Harm that over the last few months her vision has changed. Her blood pressure today is 130/70 and denies any pain on the left side.

## 2017-11-10 NOTE — Therapy (Signed)
[facility] [address] [place], [place], [address] Phone: [phone]   Fax:  [phone]  Physical Therapy Evaluation  Patient Details  Name: Lynn Howell MRN: [id number] Date of Birth: 12/31/1925 Referring Provider (PT): Dr. Cathlean Cower   Encounter Date: 11/10/2017  PT End of Session - 11/10/17 1125    Visit Number  1    Number of Visits  1    Authorization Type  HT [employer]    PT Start Time  1020    PT Stop Time  1105    PT Time Calculation (min)  45 min    Activity Tolerance  No increased pain    Behavior During Therapy  --   unable to answer medical history questions      Past Medical History:  Diagnosis Date  . ANEMIA-IRON DEFICIENCY 08/01/2007  . ANKLE PAIN, LEFT 01/22/2007  . Arthritis   . BACK PAIN, CHRONIC, INTERMITTENT 02/10/2010  . CERUMEN IMPACTION, RIGHT 04/20/2007  . COLONIC POLYPS, HX OF 09/21/2006  . COMMON MIGRAINE 09/21/2006  . CONSTIPATION, CHRONIC 09/21/2006  . CONTACT DERMATITIS 08/01/2008  . DEGENERATIVE JOINT DISEASE, KNEES, BILATERAL 03/19/2010  . DEPRESSION 08/01/2007  . Hypercholesteremia   . HYPERLIPIDEMIA 08/01/2007  . Hypertension   . HYPERTENSION 09/21/2006  . KNEE PAIN, BILATERAL 09/15/2008  . Left cervical radiculopathy 03/07/2012  . LEG PAIN, RIGHT 03/03/2008  . 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 LUMBAR RADICULOPATHY, RIGHT 03/19/2010  . NEPHROLITHIASIS, HX OF 09/21/2006  . OSTEOPENIA 12/17/2008  . OSTEOPOROSIS 01/22/2007  . SPINAL STENOSIS 12/17/2008  . Stroke ([place])   . Swelling of limb 03/19/2010  . URI 04/20/2007    Past Surgical History:  Procedure Laterality Date  . ABDOMINAL HYSTERECTOMY    . CHOLECYSTECTOMY    . TUBAL LIGATION      Vitals:   11/10/17 1123  BP: 130/70     Subjective Assessment - 11/10/17 1023    Subjective  The patient reports a worsening of mobility over the past 3 weeks.  She notes walking is getting more difficult.  She has had a fall in  which the chair slid from under her.  The patient walks with an outdoor stick (like a rake handle) outside her house, uses furniture to walk inside, and came in today with a SPC.      Patient is accompained by:  Family member   son, Eden Lathe   Pertinent History  Reviewed extensive history in epic    Patient Stated Goals  Improve strength and balance "I don't know."    Currently in Pain?  No/denies    Pain Location  Back    Pain Descriptors / Indicators  Aching    Pain Radiating Towards  some days it's just in the middle and other days it is on both sides; She also notes foot pain when her back hurts.    Pain Onset  More than a month ago    Pain Frequency  Intermittent    Aggravating Factors   standing in one place    Pain Relieving Factors  sometimes sitting, or goes to bed    Multiple Pain Sites  No         [facility] PT Assessment - 11/10/17 1029      Assessment   Medical Diagnosis  Gait disorder, low back pain    Referring Provider (PT)  Dr. Cathlean Cower    Onset Date/Surgical Date  --   08/2017   Prior  Therapy  none      Precautions   Precautions  Fall      Restrictions   Weight Bearing Restrictions  No      Balance Screen   Has the patient fallen in the past 6 months  Yes    How many times?  1    Has the patient had a decrease in activity level because of a fear of falling?   Yes    Is the patient reluctant to leave their home because of a fear of falling?   No      Home Environment   Living Environment  Private residence    Living Arrangements  Children   daughter lives with her   Type of [place] to enter    Entrance Stairs-Number of Steps  8    Entrance Stairs-Rails  Can reach both    [place]  One level    [place] - single point;Tub bench   also uses a rake handle (long portion) to walk outdoors     Prior Function   Level of Independence  Needs assistance with ADLs    Comments  granddaughter helps since her fall       Sensation   Light Touch  Appears Intact    Additional Comments  Note visible swelling of left proximal thigh.  It measures 52cm in circumference and R thigh measures 44cm in circumference.        ROM / Strength   AROM / PROM / Strength  AROM;Strength      AROM   Overall AROM   Deficits    Overall AROM Comments  R UE is 3/5 for muscle strength for shoulder flexion/shoulder abduction, elbow flexion.  L UE is 4+/5 for shoulder fleixon/abduction and elbow flexion.  Patient notes weakness in the right arm has occurred over the past couple of days.  She denies HA, does note vision changes over the past 3-4 months with difficulty reading.  Denies double vision "it slides away from you."      Strength   Overall Strength Comments  "My right arm feels weaker. "  Patient notes difficulty fastening her bra now.    Strength Assessment Site  Hip;Knee;Ankle    Right/Left Hip  Right;Left      Bed Mobility   Bed Mobility  Supine to Sit;Sit to Supine    Supine to Sit  Supervision/Verbal cueing    Sit to Supine  Supervision/Verbal cueing      Transfers   Transfers  Sit to Stand;Stand to Sit    Sit to Stand  4: Min assist    Stand to Sit  5: Supervision      Ambulation/Gait   Ambulation/Gait  Yes    Ambulation/Gait Assistance  4: Min guard    Ambulation Distance (Feet)  75 Feet    Assistive device  Straight cane    Gait Pattern  Step-through pattern;Decreased step length - right;Decreased step length - left;Lateral trunk lean to right   needs HHA + cane   Ambulation Surface  Level           Vestibular Assessment - 11/10/17 1041      Occulomotor Exam   Comment  Patient is unable to follow instruction for smooth pursuits or saccades.  PT holds patient's head to have her track the pen without moving her head, but she does not follow the pen.  Objective measurements completed on examination: See above findings.              PT Education - 11/10/17 1123    Education  Details  Discussed holding PT until undergoing further medical evaluation    Person(s) Educated  Patient;Child(ren)    Methods  Explanation    Comprehension  Verbalized understanding                  Plan - 11/10/17 1126    Clinical Impression Statement  At today's evaluation, the patient arrived needing min A to stand in the lobby and HHA + use of SPC to walk into clinic.  She noted R UE weakness beginning last week (per son), reports worsening balance over the past 3 weeks, has L leg swelling in thigh for which she is unaware of origin, and notes visual changes over the last few months.  She denies double vision but notes the words "slip away from her" and she can no longer read.  She is unable to follow directions for smooth pursuit testing.  Her L leg is significantly larger than R thigh (by 8 cm circumferential measurements).  She does not have bruising over edematous area upon examination and does not have pain with hip ROM in supine.  PT recomended she return to MD for assessment due to multiple medical changes since her last MD visit 09/29/17.  Called office and left message for triage nurse.     History and Personal Factors relevant to plan of care:  see above *multiple medical changes    Clinical Presentation  Unstable    Clinical Presentation due to:  worsening symptoms, new onset of multiple symptoms.  Assessed gait, transfers, bed mobility, hip movement, strength, edema.    Clinical Decision Making  High    Rehab Potential  Fair    Clinical Impairments Affecting Rehab Potential  Needs further medical assessment before further therapy    PT Next Visit Plan  Write goals once patient sees MD and returns to OP (due to recent medical changes)    Consulted and Agree with Plan of Care  Patient;Family member/caregiver    Family Member Consulted  son present, Eden Lathe       Patient will benefit from skilled therapeutic intervention in order to improve the following deficits and  impairments:  Decreased activity tolerance, Decreased balance, Decreased cognition, Postural dysfunction, Abnormal gait, Pain, Decreased strength  Visit Diagnosis: Other abnormalities of gait and mobility  Muscle weakness (generalized)  Unsteadiness on feet     Problem List Patient Active Problem List   Diagnosis Date Noted  . Bilateral knee pain 09/29/2017  . Anemia 09/29/2017  . Gait disorder 09/14/2017  . Low back pain 09/14/2017  . Dizziness 03/08/2017  . Rash 09/07/2016  . Loose stools 03/13/2016  . Left foot pain 02/01/2014  . Bilateral leg pain 09/12/2013  . Peripheral edema 04/23/2012  . Left cervical radiculopathy 03/07/2012  . Preventative health care 07/15/2010  . 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 Osteoarthrosis involving lower leg 03/19/2010  . LUMBAR RADICULOPATHY, RIGHT 03/19/2010  . Backache 02/10/2010  . SPINAL STENOSIS 12/17/2008  . Hyperlipidemia 08/01/2007  . ANEMIA-IRON DEFICIENCY 08/01/2007  . Anxiety state 08/01/2007  . Depression 08/01/2007  . OSTEOPOROSIS 01/22/2007  . COMMON MIGRAINE 09/21/2006  . Essential hypertension 09/21/2006  . CONSTIPATION, CHRONIC 09/21/2006  . COLONIC POLYPS, HX OF 09/21/2006  . NEPHROLITHIASIS, HX OF 09/21/2006    Rocquel Askren , PT 11/10/2017, 11:33 AM  [facility] [address] [place], [place], [address] Phone: [phone]   Fax:  [phone]  Name: Lynn Howell MRN: [id number] Date of Birth: November 26, 1925

## 2017-11-10 NOTE — Telephone Encounter (Signed)
Phone call returned to pt's. son, Lynn Howell.  Reported when he took pt. to Physical Therapy this morning, he noticed her left thigh was more swollen than the right.  Reported the PT measured it, and noted it was "8 cm. larger than the right thigh."  the son stated the Therapist noted that the thigh was red.  Son reported he just noticed the swelling today, but his sister informed him it has been swollen about a week.  Son stated the pt. c/o intermittent pain in left leg with standing, for period of time.  Denied any swelling of the left foot or lower leg. Denied that pt. Has had any c/o chest pain or shortness of breath.  Son reported pt. has hx of Liposarcoma cancer in the left leg.  Appt. Scheduled today with PCP office for eval. of Left LE swelling/pain.  Care advice given per protocol; advised to call EMS for onset of chest pain or shortness of breath.  Son verb. understanding, and agreed with plan.          Reason for Disposition . [1] Thigh, calf, or ankle swelling AND [2] only 1 side  Answer Assessment - Initial Assessment Questions 1. ONSET: "When did the swelling start?" (e.g., minutes, hours, days)     About one week 2. LOCATION: "What part of the leg is swollen?"  "Are both legs swollen or just one leg?"     Upper left thigh all the way around 3. SEVERITY: "How bad is the swelling?" (e.g., localized; mild, moderate, severe)  - Localized - small area of swelling localized to one leg  - MILD pedal edema - swelling limited to foot and ankle, pitting edema < 1/4 inch (6 mm) deep, rest and elevation eliminate most or all swelling  - MODERATE edema - swelling of lower leg to knee, pitting edema > 1/4 inch (6 mm) deep, rest and elevation only partially reduce swelling  - SEVERE edema - swelling extends above knee, facial or hand swelling present      Son is unsure; overheard the pt. Stating it doesn't hurt that bad, it is just uncomfortable 4. REDNESS: "Does the swelling look red or infected?"  Redness when Therapist examined it.  5. PAIN: "Is the swelling painful to touch?" If so, ask: "How painful is it?"   (Scale 1-10; mild, moderate or severe)     Pain comes and goes; c/o pain with standing for period of time 6. FEVER: "Do you have a fever?" If so, ask: "What is it, how was it measured, and when did it start?"      Denied fever or chills 7. CAUSE: "What do you think is causing the leg swelling?"    Unknown 8. MEDICAL HISTORY: "Do you have a history of heart failure, kidney disease, liver failure, or cancer?"     No heart disease; reported BP issues 9. RECURRENT SYMPTOM: "Have you had leg swelling before?" If so, ask: "When was the last time?" "What happened that time?"     When her Cancer was diagnosed 10. OTHER SYMPTOMS: "Do you have any other symptoms?" (e.g., chest pain, difficulty breathing)       Denied chest pain or shortness of breath  Protocols used: LEG SWELLING AND EDEMA-A-AH  Message from Vernona Rieger sent at 11/10/2017 11:11 AM EDT   Summary: swollen left thigh   Altha Harm with [facility] called and wanted to give a report of the patient. She told me that today she has  a new issue of a very swollen thigh on the left side. It is 52 cm. She does not know how long she has had it. The patient is with her son, Lynn Howell. He can be reached at [phone]. She would like someone to call her back so that she can be evaluated.

## 2017-11-10 NOTE — Progress Notes (Signed)
Lynn Howell is a 84 y.o. female with the following history as recorded in EpicCare:  Patient Active Problem List   Diagnosis Date Noted  . Bilateral knee pain 09/29/2017  . Anemia 09/29/2017  . Gait disorder 09/14/2017  . Low back pain 09/14/2017  . Dizziness 03/08/2017  . Rash 09/07/2016  . Loose stools 03/13/2016  . Left foot pain 02/01/2014  . Bilateral leg pain 09/12/2013  . Peripheral edema 04/23/2012  . Left cervical radiculopathy 03/07/2012  . Preventative health care 07/15/2010  . 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 Osteoarthrosis involving lower leg 03/19/2010  . LUMBAR RADICULOPATHY, RIGHT 03/19/2010  . Backache 02/10/2010  . SPINAL STENOSIS 12/17/2008  . Hyperlipidemia 08/01/2007  . ANEMIA-IRON DEFICIENCY 08/01/2007  . Anxiety state 08/01/2007  . Depression 08/01/2007  . OSTEOPOROSIS 01/22/2007  . COMMON MIGRAINE 09/21/2006  . Essential hypertension 09/21/2006  . CONSTIPATION, CHRONIC 09/21/2006  . COLONIC POLYPS, HX OF 09/21/2006  . NEPHROLITHIASIS, HX OF 09/21/2006    Current Outpatient Medications  Medication Sig Dispense Refill  . amLODipine (NORVASC) 10 MG tablet TAKE ONE TABLET EACH DAY 90 tablet 1  . aspirin 325 MG tablet Take 325 mg by mouth daily.      . Calcium Carb-Cholecalciferol (CALCIUM + D3 PO) Take 600 mg by mouth.    . cholestyramine (QUESTRAN) 4 g packet Take 1 packet (4 g total) by mouth 3 (three) times daily as needed. 60 each 5  . irbesartan-hydrochlorothiazide (AVALIDE) 150-12.5 MG tablet TAKE ONE TABLET EACH DAY 90 tablet 3  . polyethylene glycol powder (GLYCOLAX/MIRALAX) powder 17 GRAMS TWICE DAILY 527 g 2  . telmisartan (MICARDIS) 20 MG tablet Take 1 tablet (20 mg total) by mouth daily. 90 tablet 3  . tiZANidine (ZANAFLEX) 2 MG tablet Take 1 tablet (2 mg total) by mouth every 6 (six) hours as needed for muscle spasms. 30 tablet 3  . triamcinolone cream (KENALOG) 0.1 % Apply 1 application topically 2 (two) times daily.  30 g 0   No current facility-administered medications for this visit.     Allergies: Alendronate sodium; Hydrochlorothiazide w-triamterene; and Norco [hydrocodone-acetaminophen]  Past Medical History:  Diagnosis Date  . ANEMIA-IRON DEFICIENCY 08/01/2007  . ANKLE PAIN, LEFT 01/22/2007  . Arthritis   . BACK PAIN, CHRONIC, INTERMITTENT 02/10/2010  . CERUMEN IMPACTION, RIGHT 04/20/2007  . COLONIC POLYPS, HX OF 09/21/2006  . COMMON MIGRAINE 09/21/2006  . CONSTIPATION, CHRONIC 09/21/2006  . CONTACT DERMATITIS 08/01/2008  . DEGENERATIVE JOINT DISEASE, KNEES, BILATERAL 03/19/2010  . DEPRESSION 08/01/2007  . Hypercholesteremia   . HYPERLIPIDEMIA 08/01/2007  . Hypertension   . HYPERTENSION 09/21/2006  . KNEE PAIN, BILATERAL 09/15/2008  . Left cervical radiculopathy 03/07/2012  . LEG PAIN, RIGHT 03/03/2008  . 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 LUMBAR RADICULOPATHY, RIGHT 03/19/2010  . NEPHROLITHIASIS, HX OF 09/21/2006  . OSTEOPENIA 12/17/2008  . OSTEOPOROSIS 01/22/2007  . SPINAL STENOSIS 12/17/2008  . Stroke ([place])   . Swelling of limb 03/19/2010  . URI 04/20/2007    Past Surgical History:  Procedure Laterality Date  . ABDOMINAL HYSTERECTOMY    . CHOLECYSTECTOMY    . TUBAL LIGATION      Family History  Problem Relation Age of Onset  . Cancer Mother   . Cancer Sister   . Hypertension Sister   . Diabetes Brother   . Hypertension Brother     Social History   Tobacco Use  . Smoking status: Never Smoker  . Smokeless tobacco: Never Used  Substance Use Topics  . Alcohol use: No    Subjective:  Left thigh pain/ swelling x 1 month per patient; accompanied by her son; area was noticed by PT today- first session with PT; history of liposarcoma; per patient, does have some pain in the thigh with standing; no pain or redness swelling in lower extremity;    Objective:  Vitals:   11/10/17 1547  BP: 122/60  Pulse: 89  Temp: 99.2 F (37.3 C)  TempSrc: Oral  SpO2: 98%  Height: 5\' 7"  (1.702 m)     General: Well developed, well nourished, in no acute distress  Head: Normocephalic and atraumatic  Eyes: Sclera and conjunctiva clear; pupils round and reactive to light; extraocular movements intact  Ears: External normal; canals clear; tympanic membranes normal  Oropharynx: Pink, supple. No suspicious lesions  Neck: Supple without thyromegaly, adenopathy  Lungs: Respirations unlabored;  Extremities: mild bilateral edema, no cyanosis, no clubbing  Vessels: Symmetric bilaterally  Neurologic: Alert and oriented; speech intact; face symmetrical; moves all extremities well; CNII-XII intact without focal deficit   Assessment:  1. Mass of left thigh   2. Iron deficiency anemia, unspecified iron deficiency anemia type   3. Anemia, unspecified type     Plan:  1. Palpable mass of thigh noted; history of liposarcoma; update CT for further evaluation; may also need to consider venous doppler if CT is unremarkable; 2. & 3. Update labs today;   No follow-ups on file.  Orders Placed This Encounter  Procedures  . CT EXTREMITY LOWER LEFT W CONTRAST    SS. Cecille Rubin [phone]/ HTA / Not Diabetic / will get labs     Standing Status:   Future    Standing Expiration Date:   01/11/2019    Order Specific Question:   If indicated for the ordered procedure, I authorize the administration of contrast media per Radiology protocol    Answer:   Yes    Order Specific Question:   Preferred imaging location?    Answer:   [address]    Order Specific Question:   Radiology Contrast Protocol - do NOT remove file path    Answer:   [email or web address]  . CBC w/Diff    Standing Status:   Future    Standing Expiration Date:   11/10/2018  . Iron, TIBC and Ferritin Panel    Standing Status:   Future    Standing Expiration Date:   11/11/2018  . Basic Metabolic Panel (BMET)    Standing Status:   Future    Standing Expiration Date:   11/10/2018    Requested Prescriptions    No  prescriptions requested or ordered in this encounter

## 2017-11-11 LAB — IRON,TIBC AND FERRITIN PANEL
%SAT: 15 % (calc) — ABNORMAL LOW (ref 16–45)
Ferritin: 19 ng/mL (ref 16–288)
IRON: 52 ug/dL (ref 45–160)
TIBC: 339 mcg/dL (calc) (ref 250–450)

## 2017-11-13 NOTE — Telephone Encounter (Signed)
Please ask pt to make ROV for left arm weakness

## 2017-11-13 NOTE — Telephone Encounter (Signed)
Patient seen Valere Dross on 10/25.  Has ultrasound on leg scheduled 10/30.  I have scheduled patient for 11/6 to follow up with Dr. Jenny Reichmann.

## 2017-11-15 ENCOUNTER — Ambulatory Visit (INDEPENDENT_AMBULATORY_CARE_PROVIDER_SITE_OTHER)
Admission: RE | Admit: 2017-11-15 | Discharge: 2017-11-15 | Disposition: A | Discharge status: Home / Self Care | Payer: PPO | Source: Ambulatory Visit | Attending: Family | Admitting: Family

## 2017-11-15 DIAGNOSIS — R2242 Localized swelling, mass and lump, left lower limb: Secondary | ICD-10-CM | POA: Diagnosis not present

## 2017-11-15 MED ORDER — IOPAMIDOL (ISOVUE-300) INJECTION 61%
75.0000 mL | Freq: Once | INTRAVENOUS | Status: AC | PRN
  Administered 2017-11-15: 75 mL via INTRAVENOUS

## 2017-11-16 ENCOUNTER — Other Ambulatory Visit: Payer: Self-pay

## 2017-11-16 ENCOUNTER — Encounter (HOSPITAL_COMMUNITY): Payer: Self-pay

## 2017-11-16 ENCOUNTER — Emergency Department (HOSPITAL_COMMUNITY): Payer: PPO

## 2017-11-16 ENCOUNTER — Emergency Department (HOSPITAL_COMMUNITY)
Admission: EM | Admit: 2017-11-16 | Discharge: 2017-11-16 | Disposition: A | Discharge status: Home / Self Care | Payer: PPO | Attending: Emergency Medicine | Admitting: Emergency Medicine

## 2017-11-16 DIAGNOSIS — S3993XA Unspecified injury of pelvis, initial encounter: Secondary | ICD-10-CM | POA: Diagnosis not present

## 2017-11-16 DIAGNOSIS — S0990XA Unspecified injury of head, initial encounter: Secondary | ICD-10-CM | POA: Diagnosis not present

## 2017-11-16 DIAGNOSIS — W19XXXA Unspecified fall, initial encounter: Secondary | ICD-10-CM | POA: Diagnosis not present

## 2017-11-16 DIAGNOSIS — I10 Essential (primary) hypertension: Secondary | ICD-10-CM | POA: Diagnosis not present

## 2017-11-16 DIAGNOSIS — Z79899 Other long term (current) drug therapy: Secondary | ICD-10-CM | POA: Diagnosis not present

## 2017-11-16 DIAGNOSIS — S79921A Unspecified injury of right thigh, initial encounter: Secondary | ICD-10-CM | POA: Diagnosis not present

## 2017-11-16 DIAGNOSIS — R531 Weakness: Secondary | ICD-10-CM | POA: Diagnosis not present

## 2017-11-16 DIAGNOSIS — C4922 Malignant neoplasm of connective and soft tissue of left lower limb, including hip: Secondary | ICD-10-CM | POA: Insufficient documentation

## 2017-11-16 DIAGNOSIS — Y92009 Unspecified place in unspecified non-institutional (private) residence as the place of occurrence of the external cause: Secondary | ICD-10-CM | POA: Insufficient documentation

## 2017-11-16 DIAGNOSIS — Y939 Activity, unspecified: Secondary | ICD-10-CM | POA: Insufficient documentation

## 2017-11-16 DIAGNOSIS — Z8673 Personal history of transient ischemic attack (TIA), and cerebral infarction without residual deficits: Secondary | ICD-10-CM | POA: Diagnosis not present

## 2017-11-16 DIAGNOSIS — Y999 Unspecified external cause status: Secondary | ICD-10-CM | POA: Insufficient documentation

## 2017-11-16 DIAGNOSIS — M79651 Pain in right thigh: Secondary | ICD-10-CM | POA: Diagnosis not present

## 2017-11-16 DIAGNOSIS — Z7982 Long term (current) use of aspirin: Secondary | ICD-10-CM | POA: Insufficient documentation

## 2017-11-16 DIAGNOSIS — C4921 Malignant neoplasm of connective and soft tissue of right lower limb, including hip: Secondary | ICD-10-CM | POA: Diagnosis not present

## 2017-11-16 DIAGNOSIS — S3992XA Unspecified injury of lower back, initial encounter: Secondary | ICD-10-CM | POA: Diagnosis not present

## 2017-11-16 DIAGNOSIS — R262 Difficulty in walking, not elsewhere classified: Secondary | ICD-10-CM | POA: Diagnosis not present

## 2017-11-16 DIAGNOSIS — S299XXA Unspecified injury of thorax, initial encounter: Secondary | ICD-10-CM | POA: Diagnosis not present

## 2017-11-16 DIAGNOSIS — R404 Transient alteration of awareness: Secondary | ICD-10-CM | POA: Diagnosis not present

## 2017-11-16 DIAGNOSIS — R102 Pelvic and perineal pain: Secondary | ICD-10-CM | POA: Diagnosis not present

## 2017-11-16 DIAGNOSIS — M533 Sacrococcygeal disorders, not elsewhere classified: Secondary | ICD-10-CM | POA: Diagnosis not present

## 2017-11-16 LAB — URINALYSIS, ROUTINE W REFLEX MICROSCOPIC
BACTERIA UA: NONE SEEN
Bilirubin Urine: NEGATIVE
GLUCOSE, UA: NEGATIVE mg/dL
KETONES UR: 5 mg/dL — AB
LEUKOCYTES UA: NEGATIVE
Nitrite: NEGATIVE
PH: 6 (ref 5.0–8.0)
PROTEIN: NEGATIVE mg/dL
Specific Gravity, Urine: 1.02 (ref 1.005–1.030)

## 2017-11-16 LAB — CBC WITH DIFFERENTIAL/PLATELET
Abs Immature Granulocytes: 0.02 10*3/uL (ref 0.00–0.07)
BASOS PCT: 0 %
Basophils Absolute: 0 10*3/uL (ref 0.0–0.1)
EOS PCT: 0 %
Eosinophils Absolute: 0 10*3/uL (ref 0.0–0.5)
HEMATOCRIT: 31.8 % — AB (ref 36.0–46.0)
Hemoglobin: 10 g/dL — ABNORMAL LOW (ref 12.0–15.0)
IMMATURE GRANULOCYTES: 0 %
LYMPHS ABS: 0.9 10*3/uL (ref 0.7–4.0)
Lymphocytes Relative: 12 %
MCH: 29.3 pg (ref 26.0–34.0)
MCHC: 31.4 g/dL (ref 30.0–36.0)
MCV: 93.3 fL (ref 80.0–100.0)
MONO ABS: 0.5 10*3/uL (ref 0.1–1.0)
MONOS PCT: 7 %
Neutro Abs: 6 10*3/uL (ref 1.7–7.7)
Neutrophils Relative %: 81 %
Platelets: 230 10*3/uL (ref 150–400)
RBC: 3.41 MIL/uL — ABNORMAL LOW (ref 3.87–5.11)
RDW: 14.1 % (ref 11.5–15.5)
WBC: 7.5 10*3/uL (ref 4.0–10.5)
nRBC: 0 % (ref 0.0–0.2)

## 2017-11-16 LAB — COMPREHENSIVE METABOLIC PANEL
ALK PHOS: 61 U/L (ref 38–126)
ALT: 21 U/L (ref 0–44)
AST: 46 U/L — ABNORMAL HIGH (ref 15–41)
Albumin: 3.5 g/dL (ref 3.5–5.0)
Anion gap: 6 (ref 5–15)
BUN: 18 mg/dL (ref 8–23)
CALCIUM: 9.2 mg/dL (ref 8.9–10.3)
CHLORIDE: 108 mmol/L (ref 98–111)
CO2: 26 mmol/L (ref 22–32)
Creatinine, Ser: 1.2 mg/dL — ABNORMAL HIGH (ref 0.44–1.00)
GFR, EST AFRICAN AMERICAN: 45 mL/min — AB (ref >60)
GFR, EST NON AFRICAN AMERICAN: 39 mL/min — AB (ref >60)
Glucose, Bld: 100 mg/dL — ABNORMAL HIGH (ref 70–99)
Potassium: 4 mmol/L (ref 3.5–5.1)
Sodium: 140 mmol/L (ref 135–145)
Total Bilirubin: 1.2 mg/dL (ref 0.3–1.2)
Total Protein: 6.1 g/dL — ABNORMAL LOW (ref 6.5–8.1)

## 2017-11-16 LAB — TYPE AND SCREEN
ABO/RH(D): AB POS
ANTIBODY SCREEN: NEGATIVE

## 2017-11-16 LAB — CK: CK TOTAL: 817 U/L — AB (ref 38–234)

## 2017-11-16 NOTE — ED Provider Notes (Signed)
[facility] DEPT Provider Note   CSN: [id number] Arrival date & time: 11/16/17  1613     History   Chief Complaint Chief Complaint  Patient presents with  . Fall    HPI Lynn Howell is a 84 y.o. female.  Pt presents to the ED today after a fall.  The pt lives at home alone, but son and his 2 sisters rotate through watching patient.  The pt fell in the living room in between when her son left and daughter arrived.  Pt does not remember falling.  Pt's son said she's been getting progressively weaker.  He said she's been leaning to one side.  She has a hx of liposarcoma and had a CT scan yesterday which revealed that it was back in her left leg.  The son said her hemoglobin has also been low.  He's worried she may have had a stroke.  Not today, but a few weeks ago.  Pt c/o tailbone pain.  She is not on blood thinners.  Pt has an appt with Dr. Jenny Reichmann to talk about her CT results.     Past Medical History:  Diagnosis Date  . ANEMIA-IRON DEFICIENCY 08/01/2007  . ANKLE PAIN, LEFT 01/22/2007  . Arthritis   . BACK PAIN, CHRONIC, INTERMITTENT 02/10/2010  . CERUMEN IMPACTION, RIGHT 04/20/2007  . COLONIC POLYPS, HX OF 09/21/2006  . COMMON MIGRAINE 09/21/2006  . CONSTIPATION, CHRONIC 09/21/2006  . CONTACT DERMATITIS 08/01/2008  . DEGENERATIVE JOINT DISEASE, KNEES, BILATERAL 03/19/2010  . DEPRESSION 08/01/2007  . Hypercholesteremia   . HYPERLIPIDEMIA 08/01/2007  . Hypertension   . HYPERTENSION 09/21/2006  . KNEE PAIN, BILATERAL 09/15/2008  . Left cervical radiculopathy 03/07/2012  . LEG PAIN, RIGHT 03/03/2008  . 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 LUMBAR RADICULOPATHY, RIGHT 03/19/2010  . NEPHROLITHIASIS, HX OF 09/21/2006  . OSTEOPENIA 12/17/2008  . OSTEOPOROSIS 01/22/2007  . SPINAL STENOSIS 12/17/2008  . Stroke ([place])   . Swelling of limb 03/19/2010  . URI 04/20/2007    Patient Active Problem List   Diagnosis Date Noted  . Bilateral knee pain 09/29/2017  .  Anemia 09/29/2017  . Gait disorder 09/14/2017  . Low back pain 09/14/2017  . Dizziness 03/08/2017  . Rash 09/07/2016  . Loose stools 03/13/2016  . Left foot pain 02/01/2014  . Bilateral leg pain 09/12/2013  . Peripheral edema 04/23/2012  . Left cervical radiculopathy 03/07/2012  . Preventative health care 07/15/2010  . 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 Osteoarthrosis involving lower leg 03/19/2010  . LUMBAR RADICULOPATHY, RIGHT 03/19/2010  . Backache 02/10/2010  . SPINAL STENOSIS 12/17/2008  . Hyperlipidemia 08/01/2007  . ANEMIA-IRON DEFICIENCY 08/01/2007  . Anxiety state 08/01/2007  . Depression 08/01/2007  . OSTEOPOROSIS 01/22/2007  . COMMON MIGRAINE 09/21/2006  . Essential hypertension 09/21/2006  . CONSTIPATION, CHRONIC 09/21/2006  . COLONIC POLYPS, HX OF 09/21/2006  . NEPHROLITHIASIS, HX OF 09/21/2006    Past Surgical History:  Procedure Laterality Date  . ABDOMINAL HYSTERECTOMY    . CHOLECYSTECTOMY    . TUBAL LIGATION       OB History   None      Home Medications    Prior to Admission medications   Medication Sig Start Date End Date Taking? Authorizing Provider  amLODipine (NORVASC) 10 MG tablet TAKE ONE TABLET EACH DAY 09/11/17   Biagio Borg, MD  aspirin 325 MG tablet Take 325 mg by mouth daily.      [provider]  Calcium Carb-Cholecalciferol (CALCIUM + D3 PO) Take 600 mg by mouth.    [provider]  cholestyramine (QUESTRAN) 4 g packet Take 1 packet (4 g total) by mouth 3 (three) times daily as needed. 03/09/16   Biagio Borg, MD  irbesartan-hydrochlorothiazide (AVALIDE) 150-12.5 MG tablet TAKE ONE TABLET EACH DAY 09/27/17   Biagio Borg, MD  polyethylene glycol powder [facility]) powder 17 GRAMS TWICE DAILY 02/27/17   Biagio Borg, MD  telmisartan (MICARDIS) 20 MG tablet Take 1 tablet (20 mg total) by mouth daily. 03/08/17   Biagio Borg, MD  tiZANidine (ZANAFLEX) 2 MG tablet Take 1 tablet (2 mg total) by  mouth every 6 (six) hours as needed for muscle spasms. 09/29/17   Biagio Borg, MD  triamcinolone cream (KENALOG) 0.1 % Apply 1 application topically 2 (two) times daily. 07/25/17   Hoyt Koch, MD    Family History Family History  Problem Relation Age of Onset  . Cancer Mother   . Cancer Sister   . Hypertension Sister   . Diabetes Brother   . Hypertension Brother     Social History Social History   Tobacco Use  . Smoking status: Never Smoker  . Smokeless tobacco: Never Used  Substance Use Topics  . Alcohol use: No  . Drug use: No     Allergies   Alendronate sodium; Hydrochlorothiazide w-triamterene; and Norco [hydrocodone-acetaminophen]   Review of Systems Review of Systems  Musculoskeletal:       Tailbone pain  Neurological: Positive for weakness.  All other systems reviewed and are negative.    Physical Exam Updated Vital Signs BP 127/66   Pulse 88   Temp 98.1 F (36.7 C) (Oral)   Resp (!) 9   Ht 5\' 7"  (1.702 m)   Wt 54 kg   SpO2 99%   BMI 18.65 kg/m   Physical Exam  Constitutional: She is oriented to person, place, and time. She appears well-developed and well-nourished.  HENT:  Head: Normocephalic and atraumatic.  Right Ear: External ear normal.  Left Ear: External ear normal.  Nose: Nose normal.  Mouth/Throat: Oropharynx is clear and moist.  Eyes: Pupils are equal, round, and reactive to light. Conjunctivae and EOM are normal.  Neck: Normal range of motion. Neck supple.  Cardiovascular: Normal rate, regular rhythm, normal heart sounds and intact distal pulses.  Pulmonary/Chest: Effort normal and breath sounds normal.  Abdominal: Soft. Bowel sounds are normal.  Musculoskeletal:  Mass to left thigh  Neurological: She is alert and oriented to person, place, and time.  Skin: Skin is warm. Capillary refill takes less than 2 seconds.  Psychiatric: She has a normal mood and affect.  Nursing note and vitals reviewed.    ED Treatments /  Results  Labs (all labs ordered are listed, but only abnormal results are displayed) Labs Reviewed  CBC WITH DIFFERENTIAL/PLATELET - Abnormal; Notable for the following components:      Result Value   RBC 3.41 (*)    Hemoglobin 10.0 (*)    HCT 31.8 (*)    All other components within normal limits  URINALYSIS, ROUTINE W REFLEX MICROSCOPIC - Abnormal; Notable for the following components:   Hgb urine dipstick MODERATE (*)    Ketones, ur 5 (*)    All other components within normal limits  CK - Abnormal; Notable for the following components:   Total CK 817 (*)    All other components within normal limits  COMPREHENSIVE METABOLIC PANEL - Abnormal;  Notable for the following components:   Glucose, Bld 100 (*)    Creatinine, Ser 1.20 (*)    Total Protein 6.1 (*)    AST 46 (*)    GFR calc non Af Amer 39 (*)    GFR calc Af Amer 45 (*)    All other components within normal limits  TYPE AND SCREEN    EKG EKG Interpretation  Date/Time:  Thursday November 16 2017 17:43:56 EDT Ventricular Rate:  96 PR Interval:    QRS Duration: 101 QT Interval:  374 QTC Calculation: 473 R Axis:   72 Text Interpretation:  Sinus rhythm Probable left atrial enlargement Confirmed by Isla Pence [phone]) on 11/16/2017 5:46:40 PM   Radiology Dg Chest 2 View  Result Date: 11/16/2017 CLINICAL DATA:  Unwitnessed fall EXAM: CHEST - 2 VIEW COMPARISON:  None. FINDINGS: The heart size and mediastinal contours are within normal limits. Both lungs are clear. The visualized skeletal structures are unremarkable. No acute displaced rib fracture. No pneumothorax or pulmonary consolidation. No effusion. Ventral suture material is seen of the included upper abdomen. IMPRESSION: No active cardiopulmonary disease. Electronically Signed   By: Ashley Royalty M.D.   On: 11/16/2017 17:26   Dg Pelvis 1-2 Views  Result Date: 11/16/2017 CLINICAL DATA:  Pain after fall EXAM: PELVIS - 1-2 VIEW COMPARISON:  Lumbar spine 09/14/2017  FINDINGS: Lower lumbar degenerative disc and facet arthropathy is identified. No pelvic diastasis or fracture. The pubic rami appear intact. Joint spaces are maintained about both hips without fracture. Numerous phleboliths are present about the lower abdomen pelvis. Partially opacified urinary bladder. Bowel gas pattern is unremarkable. Mild sclerosis about the right SI joint with spurring and of the superior left sacral ala. IMPRESSION: Osteoarthritis about the SI joints. Lower lumbar spondylosis. No acute pelvic fracture or joint dislocation. Electronically Signed   By: Ashley Royalty M.D.   On: 11/16/2017 17:28   Dg Sacrum/coccyx  Result Date: 11/16/2017 CLINICAL DATA:  Pain after fall. EXAM: SACRUM AND COCCYX - 2+ VIEW COMPARISON:  Lumbar spine 09/14/2017 FINDINGS: Mild osteoarthritis of the inferior right sacroiliac joint and upper left sacral ala secondary to what may represent a left L5-S1 assimilation joint. No disruption of the sacral arcuate lines. There appears to be some callus noted along the distal sacrum, more apparent on current exam and may reflect healing fracture. Coccyx is not well visualized this study. Hyperdense appearance of the urinary bladder nonspecific. IMPRESSION: 1. Osteoarthritis about the right SI joint and adjacent to what appears to be a left L5-S1 assimilation joint. 2. Some interval callus formation is suggested about the distal sacrum which may reflect interval healing and callus formation. The coccyx is not well visualized on the lateral view and obscured on the frontal projection. If necessary, CT of the pelvis may help for better assessment. Electronically Signed   By: Ashley Royalty M.D.   On: 11/16/2017 17:36   Ct Head Wo Contrast  Result Date: 11/16/2017 CLINICAL DATA:  Unwitnessed fall.  No loss of consciousness. EXAM: CT HEAD WITHOUT CONTRAST TECHNIQUE: Contiguous axial images were obtained from the base of the skull through the vertex without intravenous contrast.  COMPARISON:  MRI of April 30, 2006. FINDINGS: Brain: Mild chronic ischemic white matter disease is noted. No mass effect or midline shift is noted. Ventricular size is within normal limits. There is no evidence of mass lesion, hemorrhage or acute infarction. Vascular: No hyperdense vessel or unexpected calcification. Skull: Normal. Negative for fracture or focal lesion. Sinuses/Orbits:  No acute finding. Other: None. IMPRESSION: Mild chronic ischemic white matter disease. No acute intracranial abnormality seen. Electronically Signed   By: Marijo Conception, M.D.   On: 11/16/2017 17:01   Ct Extremity Lower Left W Contrast  Result Date: 11/15/2017 CLINICAL DATA:  Left thigh mass. EXAM: CT OF THE LOWER LEFT EXTREMITY WITH CONTRAST TECHNIQUE: Multidetector CT imaging of the lower left extremity was performed according to the standard protocol following intravenous contrast administration. COMPARISON:  MRI 04/14/2010 CONTRAST:  75mL ISOVUE-300 IOPAMIDOL (ISOVUE-300) INJECTION 61% FINDINGS: There is an extensive liposarcoma involving the quadriceps compartment of the left thigh. The lesion contains a fair amount of fat but there is also nodular soft tissue density, vessels and septations. This was also noted on the prior MRI examination. It measures approximately 33 x 10.3 x 10.7 cm. On the prior MRI measured 31 x 11 x 8.5 cm. The lesion wraps around the femur approximately 270 degrees but there is no bone invasion or destruction. The surrounding quadriceps muscles are stretched and thinned. There is no invasion of the major medial neurovascular bundles. No inguinal or left-sided pelvic adenopathy. IMPRESSION: Large complex fatty mass involving the left thigh most consistent with a liposarcoma. However, it has changed little since the MRI examination from 2012. Electronically Signed   By: Marijo Sanes M.D.   On: 11/15/2017 17:10   Dg Femur Min 2 Views Right  Result Date: 11/16/2017 CLINICAL DATA:  Unwitnessed  fall.  Pain. EXAM: RIGHT FEMUR 2 VIEWS COMPARISON:  None. FINDINGS: Overlapping AP and frog-leg views of the right femur. Moderate joint space narrowing of the knee. No joint dislocation of the hip or knee. No suspicious osseous lesions nor fracture. No joint effusion. No radiopaque foreign body. Vascular calcifications are noted along the course of the femoral through popliteal arteries. IMPRESSION: No acute right femoral fracture or malalignment. Electronically Signed   By: Ashley Royalty M.D.   On: 11/16/2017 17:31    Procedures Procedures (including critical care time)  Medications Ordered in ED Medications - No data to display   Initial Impression / Assessment and Plan / ED Course  I have reviewed the triage vital signs and the nursing notes.  Pertinent labs & imaging results that were available during my care of the patient were reviewed by me and considered in my medical decision making (see chart for details).   Pt has been able to ambulate while here.  Pt does have some mild rhabdo and was given IVFs.  The pt does have ckd which has not worsened.  She has mild anemia which has also not worsened.    I spoke with a daughter also.  She is willing to have home health evaluation.  I spoke with SW and put in a face to face.  Pt is instructed to return if worse.  Final Clinical Impressions(s) / ED Diagnoses   Final diagnoses:  Fall, initial encounter  Liposarcoma of left thigh [facility])    ED Discharge Orders    None       Isla Pence, MD 11/16/17 2149

## 2017-11-16 NOTE — ED Triage Notes (Signed)
Pt BIB EMS from home with unwitnessed fall. Pt was found on the ground. Pt complaining of leg and tailbone pain. Pt does not remember falling but denies hitting head. Normally A&O x3. Denies LOC and blood thinners. Pts son reported cancer in right leg.   133/80 HR 90 99%

## 2017-11-16 NOTE — ED Notes (Signed)
Patient on [place].

## 2017-11-16 NOTE — ED Notes (Signed)
Bed: OO87 Expected date:  Expected time:  Means of arrival:  Comments: [place] [facility]

## 2017-11-16 NOTE — ED Notes (Signed)
Bed: [facility] Expected date:  Expected time:  Means of arrival:  Comments: EMS 84yo fall

## 2017-11-16 NOTE — Progress Notes (Signed)
CSW met with family and provided SNF/ALF lists.  Per EPD, pt will be D/C'd home.  EPD wplaced a consult for CM with an order for face-to-face and an order for RN/Aide/PT/OT and Social Work.  CSW counseled pt's daughter on SNF's, ALF's, how they work, who is qualified to go there, insurances that do and don't pay for them and how it is paid for otherwise.  CSW provided pt's daughter with a list of Assisted Living FacilitiesSNF's in the [place] area and counseled pt on seeking admission and how it is done.  Pt's daughter was appreciative and thanked the CSW, but stated pt's son should be the contact person.  Pt's son is Favor Kreh at ph: [phone] and is a [employer] who transports pt from floor to floor at [facility].  Please reconsult if future social work needs arise.  CSW signing off, as social work intervention is no longer needed.  Alphonse Guild. Xayden Linsey, LCSW, LCAS, CSI Clinical Social Worker Ph: [phone]

## 2017-11-17 ENCOUNTER — Other Ambulatory Visit: Payer: Self-pay | Admitting: Family

## 2017-11-17 DIAGNOSIS — C499 Malignant neoplasm of connective and soft tissue, unspecified: Secondary | ICD-10-CM

## 2017-11-17 LAB — ABO/RH: ABO/RH(D): AB POS

## 2017-11-22 ENCOUNTER — Encounter: Payer: Self-pay | Admitting: Internal Medicine

## 2017-11-22 ENCOUNTER — Ambulatory Visit (INDEPENDENT_AMBULATORY_CARE_PROVIDER_SITE_OTHER): Payer: PPO | Admitting: Internal Medicine

## 2017-11-22 VITALS — BP 170/94 | HR 68 | Temp 97.5°F | Resp 16 | Wt 123.0 lb

## 2017-11-22 DIAGNOSIS — D649 Anemia, unspecified: Secondary | ICD-10-CM | POA: Diagnosis not present

## 2017-11-22 DIAGNOSIS — R531 Weakness: Secondary | ICD-10-CM | POA: Insufficient documentation

## 2017-11-22 DIAGNOSIS — R32 Unspecified urinary incontinence: Secondary | ICD-10-CM | POA: Diagnosis not present

## 2017-11-22 DIAGNOSIS — R269 Unspecified abnormalities of gait and mobility: Secondary | ICD-10-CM | POA: Diagnosis not present

## 2017-11-22 DIAGNOSIS — F0390 Unspecified dementia without behavioral disturbance: Secondary | ICD-10-CM | POA: Diagnosis not present

## 2017-11-22 DIAGNOSIS — C4922 Malignant neoplasm of connective and soft tissue of left lower limb, including hip: Secondary | ICD-10-CM

## 2017-11-22 NOTE — Patient Instructions (Addendum)
Please continue all other medications as before, and refills have been done if requested.  Please have the pharmacy call with any other refills you may need.  Please keep your appointments with your specialists as you may have planned  You will be contacted regarding the referral for: Home Health with RN, Physical Therapy and Aide  You are given the prescription for the walker  Please go to the LAB in the Basement (turn left off the elevator) for the tests to be done today - just the urine testing today  You will be contacted by phone if any changes need to be made immediately.  Otherwise, you will receive a letter about your results with an explanation, but please check with MyChart first.  Please remember to sign up for MyChart if you have not done so, as this will be important to you in the future with finding out test results, communicating by private email, and scheduling acute appointments online when needed.  Please return in 6 months, or sooner if needed, with Lab testing done 3-5 days before

## 2017-11-22 NOTE — Assessment & Plan Note (Addendum)
New onset this wk, etiology unclear, possibly dementia related?, to check urine studies, consider antbx for abnormal UA

## 2017-11-22 NOTE — Progress Notes (Signed)
Subjective:    Patient ID: Lynn Howell, female    DOB: 02/19/1925, 84 y.o.   MRN: [id number]  HPI  Here to f/u with son after recent fall and seen in ED oct 31.  The pt lives at home alone, but son and his 2 sisters rotate through watching patient.  The pt fell in the living room in between when her son left and daughter arrived.  Pt does not remember falling.  Pt's son said she's been getting progressively weaker.  Pt is more nervous, but also with increasing memory difficulty over last few months.  She states she is concerned she may have to have her left leg taken off due to the liposarcoma, and does not recall events of past yrs with f/u at [facility] and no surgury recommended.  Also unusual is 1 wk of urinary incontinence frequently, but Denies urinary symptoms such as dysuria, frequency, urgency, flank pain, hematuria or n/v, fever, chills.  No overt bleeding.  No further falls since seen in ED, has a 2 wheel walker, but son asks for the [place] with seat.   Past Medical History:  Diagnosis Date  . ANEMIA-IRON DEFICIENCY 08/01/2007  . ANKLE PAIN, LEFT 01/22/2007  . Arthritis   . BACK PAIN, CHRONIC, INTERMITTENT 02/10/2010  . CERUMEN IMPACTION, RIGHT 04/20/2007  . COLONIC POLYPS, HX OF 09/21/2006  . COMMON MIGRAINE 09/21/2006  . CONSTIPATION, CHRONIC 09/21/2006  . CONTACT DERMATITIS 08/01/2008  . DEGENERATIVE JOINT DISEASE, KNEES, BILATERAL 03/19/2010  . DEPRESSION 08/01/2007  . Hypercholesteremia   . HYPERLIPIDEMIA 08/01/2007  . Hypertension   . HYPERTENSION 09/21/2006  . KNEE PAIN, BILATERAL 09/15/2008  . Left cervical radiculopathy 03/07/2012  . LEG PAIN, RIGHT 03/03/2008  . 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 LUMBAR RADICULOPATHY, RIGHT 03/19/2010  . NEPHROLITHIASIS, HX OF 09/21/2006  . OSTEOPENIA 12/17/2008  . OSTEOPOROSIS 01/22/2007  . SPINAL STENOSIS 12/17/2008  . Stroke ([place])   . Swelling of limb 03/19/2010  . URI 04/20/2007   Past Surgical History:  Procedure Laterality Date  .  ABDOMINAL HYSTERECTOMY    . CHOLECYSTECTOMY    . TUBAL LIGATION      reports that she has never smoked. She has never used smokeless tobacco. She reports that she does not drink alcohol or use drugs. family history includes Cancer in her mother and sister; Diabetes in her brother; Hypertension in her brother and sister. Allergies  Allergen Reactions  . Alendronate Sodium   . Hydrochlorothiazide W-Triamterene   . Norco [Hydrocodone-Acetaminophen] Nausea Only   Current Outpatient Medications on File Prior to Visit  Medication Sig Dispense Refill  . amLODipine (NORVASC) 10 MG tablet TAKE ONE TABLET EACH DAY 90 tablet 1  . aspirin 325 MG tablet Take 325 mg by mouth daily.      . Calcium Carb-Cholecalciferol (CALCIUM + D3 PO) Take 600 mg by mouth.    . cholestyramine (QUESTRAN) 4 g packet Take 1 packet (4 g total) by mouth 3 (three) times daily as needed. 60 each 5  . irbesartan-hydrochlorothiazide (AVALIDE) 150-12.5 MG tablet TAKE ONE TABLET EACH DAY 90 tablet 3  . polyethylene glycol powder (GLYCOLAX/MIRALAX) powder 17 GRAMS TWICE DAILY 527 g 2  . telmisartan (MICARDIS) 20 MG tablet Take 1 tablet (20 mg total) by mouth daily. 90 tablet 3  . tiZANidine (ZANAFLEX) 2 MG tablet Take 1 tablet (2 mg total) by mouth every 6 (six) hours as needed for muscle spasms. 30 tablet 3  . triamcinolone cream (KENALOG) 0.1 %  Apply 1 application topically 2 (two) times daily. 30 g 0   No current facility-administered medications on file prior to visit.    Review of Systems  Constitutional: Negative for other unusual diaphoresis or sweats HENT: Negative for ear discharge or swelling Eyes: Negative for other worsening visual disturbances Respiratory: Negative for stridor or other swelling  Gastrointestinal: Negative for worsening distension or other blood Genitourinary: Negative for retention or other urinary change Musculoskeletal: Negative for other MSK pain or swelling Skin: Negative for color change or  other new lesions Neurological: Negative for worsening tremors and other numbness  Psychiatric/Behavioral: Negative for worsening agitation or other fatigue All other system neg per pt    Objective:   Physical Exam BP (!) 170/94   Pulse 68   Temp (!) 97.5 F (36.4 C) (Oral)   Resp 16   Wt 123 lb (55.8 kg)   SpO2 95%   BMI 19.26 kg/m  VS noted, exam while seated, unableunwilling to get up to exam table Constitutional: Pt appears in NAD HENT: Head: NCAT.  Right Ear: External ear normal.  Left Ear: External ear normal.  Eyes: . Pupils are equal, round, and reactive to light. Conjunctivae and EOM are normal Nose: without d/c or deformity Neck: Neck supple. Gross normal ROM Cardiovascular: Normal rate and regular rhythm.   Pulmonary/Chest: Effort normal and breath sounds without rales or wheezing.  Abd:  Soft, NT, ND, + BS, no organomegaly, no flank tender Neurological: Pt is alert. Has decreased ST and LT memory fxn, motor grossly intact but with general weakness,  Left leg with painless mass essentially no change Skin: Skin is warm. No rashes, other new lesions, no LE edema Psychiatric: Pt behavior is normal without agitation but nervous No other exam findings  Lab Results  Component Value Date   WBC 7.5 11/16/2017   HGB 10.0 (L) 11/16/2017   HCT 31.8 (L) 11/16/2017   PLT 230 11/16/2017   GLUCOSE 100 (H) 11/16/2017   CHOL 200 09/14/2017   TRIG 46.0 09/14/2017   HDL 91.40 09/14/2017   LDLDIRECT 134.5 11/01/2012   LDLCALC 99 09/14/2017   ALT 21 11/16/2017   AST 46 (H) 11/16/2017   NA 140 11/16/2017   K 4.0 11/16/2017   CL 108 11/16/2017   CREATININE 1.20 (H) 11/16/2017   BUN 18 11/16/2017   CO2 26 11/16/2017   TSH 2.92 09/14/2017        Assessment & Plan:

## 2017-11-23 ENCOUNTER — Inpatient Hospital Stay: Admission: RE | Admit: 2017-11-23 | Payer: PPO | Source: Ambulatory Visit

## 2017-11-23 ENCOUNTER — Other Ambulatory Visit (INDEPENDENT_AMBULATORY_CARE_PROVIDER_SITE_OTHER): Payer: PPO

## 2017-11-23 DIAGNOSIS — R32 Unspecified urinary incontinence: Secondary | ICD-10-CM

## 2017-11-23 DIAGNOSIS — F0390 Unspecified dementia without behavioral disturbance: Secondary | ICD-10-CM | POA: Insufficient documentation

## 2017-11-23 LAB — URINALYSIS, ROUTINE W REFLEX MICROSCOPIC
BILIRUBIN URINE: NEGATIVE
HGB URINE DIPSTICK: NEGATIVE
Ketones, ur: NEGATIVE
Leukocytes, UA: NEGATIVE
Nitrite: NEGATIVE
PH: 6 (ref 5.0–8.0)
RBC / HPF: NONE SEEN (ref 0–?)
Specific Gravity, Urine: 1.025 (ref 1.000–1.030)
TOTAL PROTEIN, URINE-UPE24: NEGATIVE
Urine Glucose: NEGATIVE
Urobilinogen, UA: 0.2 (ref 0.0–1.0)

## 2017-11-23 NOTE — Assessment & Plan Note (Signed)
Pt reassured no plans for left leg surgury

## 2017-11-23 NOTE — Assessment & Plan Note (Addendum)
Ok for [employer]  Note:  Total time for pt hx, exam, review of record with pt in the room, determination of diagnoses and plan for further eval and tx is > 40 min, with over 50% spent in coordination and counseling of patient including the differential dx, tx, further evaluation and other management of gait disorder, general weakness, urinary incontinence, liposarcoma , anemia and dementia

## 2017-11-23 NOTE — Assessment & Plan Note (Signed)
stable overall by history and exam, recent data reviewed with pt, and pt to continue medical treatment as before,  to f/u any worsening symptoms or concerns  

## 2017-11-23 NOTE — Assessment & Plan Note (Signed)
[place] for [facility] with RN, PT, aide

## 2017-11-23 NOTE — Assessment & Plan Note (Signed)
Recent onset, son declines further evaluation or tx for now

## 2017-11-24 ENCOUNTER — Telehealth: Payer: Self-pay

## 2017-11-24 LAB — URINE CULTURE
MICRO NUMBER: 91342468
SPECIMEN QUALITY:: ADEQUATE

## 2017-11-24 NOTE — Telephone Encounter (Signed)
-----   Message from Biagio Borg, MD sent at 11/23/2017  1:13 PM EST ----- Ok to let son know - UA is negative

## 2017-11-24 NOTE — Telephone Encounter (Signed)
Pt's son has been informed of results and expressed understanding.  

## 2017-11-29 ENCOUNTER — Telehealth: Payer: Self-pay

## 2017-11-29 NOTE — Telephone Encounter (Signed)
Spoke to son and pt has heard from home health

## 2017-11-29 NOTE — Telephone Encounter (Signed)
Copied from [place] [phone]. Topic: General - Other >> Nov 29, 2017 11:30 AM Carolyn Stare wrote:  Pt son call to follow up on home health orders

## 2017-12-13 ENCOUNTER — Telehealth: Payer: Self-pay | Admitting: Internal Medicine

## 2017-12-13 NOTE — Telephone Encounter (Signed)
Called Keesa no answer LMOM w/MD response.Marland KitchenJohny Chess

## 2017-12-13 NOTE — Telephone Encounter (Signed)
[place] for verbal If needed

## 2017-12-13 NOTE — Telephone Encounter (Signed)
Copied from [place] [id number]. Topic: Quick Communication - See Telephone Encounter >> Dec 13, 2017 10:27 AM Rutherford Nail, NT wrote: CRM for notification. See Telephone encounter for: 12/13/17. Keesa with [facility] calling and states that they will be able to go out and see the patient on 12/19/17 to start care.  CB#: [phone]

## 2017-12-18 ENCOUNTER — Telehealth: Payer: Self-pay | Admitting: Internal Medicine

## 2017-12-18 NOTE — Telephone Encounter (Signed)
Copied from [place] [phone]. Topic: General - Other >> Dec 18, 2017  1:16 PM Mcneil, Ja-Kwan wrote: Reason for CRM: Darlina Guys with [facility] stated they will be going out on 12/22/17 to start service. Cb# [phone]

## 2017-12-21 ENCOUNTER — Encounter: Payer: Self-pay | Admitting: Podiatry

## 2017-12-21 ENCOUNTER — Ambulatory Visit: Payer: PPO | Admitting: Podiatry

## 2017-12-21 DIAGNOSIS — L84 Corns and callosities: Secondary | ICD-10-CM | POA: Diagnosis not present

## 2017-12-21 DIAGNOSIS — B351 Tinea unguium: Secondary | ICD-10-CM | POA: Diagnosis not present

## 2017-12-21 DIAGNOSIS — M79674 Pain in right toe(s): Secondary | ICD-10-CM

## 2017-12-21 DIAGNOSIS — M79675 Pain in left toe(s): Secondary | ICD-10-CM | POA: Diagnosis not present

## 2017-12-21 DIAGNOSIS — M2040 Other hammer toe(s) (acquired), unspecified foot: Secondary | ICD-10-CM

## 2017-12-21 NOTE — Progress Notes (Signed)
Subjective:   Patient ID: Lynn Howell, female   DOB: 84 y.o.   MRN: [id number]   HPI Patient presents with painful nail disease 1-5 both feet that become incurvated and she cannot cut and keratotic lesion third digit right that is painful with pressure and makes wearing shoe gear difficult   ROS      Objective:  Physical Exam  Neurovascular status unchanged with incurvated nailbeds 1-5 both feet to grow over the edges of become very aggravating.  On the distal third digit right there is keratotic lesion is painful with palpation     Assessment:  Incurvated nails 1-5 both feet with my mycotic component along with digital deformity and distal keratotic lesion third right     Plan:  H&P educated on condition and did sterile debridement of nailbeds 1-5 both feet with no iatrogenic bleeding and sharp debridement of lesion digit 3 right with no iatrogenic bleeding and will be seen back in 3 months for routine care or earlier if needed

## 2017-12-22 ENCOUNTER — Telehealth: Payer: Self-pay | Admitting: Internal Medicine

## 2017-12-22 DIAGNOSIS — E785 Hyperlipidemia, unspecified: Secondary | ICD-10-CM | POA: Diagnosis not present

## 2017-12-22 DIAGNOSIS — M5416 Radiculopathy, lumbar region: Secondary | ICD-10-CM | POA: Diagnosis not present

## 2017-12-22 DIAGNOSIS — M4800 Spinal stenosis, site unspecified: Secondary | ICD-10-CM | POA: Diagnosis not present

## 2017-12-22 DIAGNOSIS — M5412 Radiculopathy, cervical region: Secondary | ICD-10-CM | POA: Diagnosis not present

## 2017-12-22 DIAGNOSIS — Z9181 History of falling: Secondary | ICD-10-CM | POA: Diagnosis not present

## 2017-12-22 DIAGNOSIS — I10 Essential (primary) hypertension: Secondary | ICD-10-CM | POA: Diagnosis not present

## 2017-12-22 DIAGNOSIS — M1991 Primary osteoarthritis, unspecified site: Secondary | ICD-10-CM | POA: Diagnosis not present

## 2017-12-22 DIAGNOSIS — C4922 Malignant neoplasm of connective and soft tissue of left lower limb, including hip: Secondary | ICD-10-CM | POA: Diagnosis not present

## 2017-12-22 DIAGNOSIS — Z7982 Long term (current) use of aspirin: Secondary | ICD-10-CM | POA: Diagnosis not present

## 2017-12-22 DIAGNOSIS — D630 Anemia in neoplastic disease: Secondary | ICD-10-CM | POA: Diagnosis not present

## 2017-12-22 DIAGNOSIS — F329 Major depressive disorder, single episode, unspecified: Secondary | ICD-10-CM | POA: Diagnosis not present

## 2017-12-22 DIAGNOSIS — Z8673 Personal history of transient ischemic attack (TIA), and cerebral infarction without residual deficits: Secondary | ICD-10-CM | POA: Diagnosis not present

## 2017-12-22 NOTE — Telephone Encounter (Signed)
Verbal orders given  

## 2017-12-22 NOTE — Telephone Encounter (Signed)
Copied from [place] [id number]. Topic: Quick Communication - Home Health Verbal Orders >> Dec 22, 2017 11:37 AM Sheran Luz wrote: Caller/Agency: Pam from [facility]-----Orders should be given to [facility] or Emmit Alexanders Number: [phone] Requesting OT/PT/Skilled Nursing/Social Work: Skilled Nursing for disease management and medication management  Frequency: 1w1 2w3  Also requesting home health aide Frequency: 2w3  And OT eval and treat

## 2017-12-25 ENCOUNTER — Telehealth: Payer: Self-pay | Admitting: Internal Medicine

## 2017-12-25 ENCOUNTER — Encounter: Payer: Self-pay | Admitting: Oncology

## 2017-12-25 ENCOUNTER — Telehealth: Payer: Self-pay | Admitting: Oncology

## 2017-12-25 NOTE — Telephone Encounter (Signed)
A new patient appt has been scheduled for Ms. Lynn Howell to see Dr. Alen Blew on 12/19 at 11am. I sent a msg to the referring office to notify the pt of the appt date and time. Letter mailed.

## 2017-12-25 NOTE — Telephone Encounter (Signed)
Ok for verbals 

## 2017-12-25 NOTE — Telephone Encounter (Signed)
Copied from [place] [phone]. Topic: Quick Communication - Home Health Verbal Orders >> Dec 25, 2017 12:22 PM Carolyn Stare wrote: Ace Gins with [facility]   Callback Number [phone]  Requesting   verbal PT   Frequency 3 x 1     2 x 3

## 2017-12-26 NOTE — Telephone Encounter (Signed)
Notified erik w/MD response.Marland KitchenJohny Chess

## 2017-12-29 DIAGNOSIS — E785 Hyperlipidemia, unspecified: Secondary | ICD-10-CM | POA: Diagnosis not present

## 2017-12-29 DIAGNOSIS — M1991 Primary osteoarthritis, unspecified site: Secondary | ICD-10-CM

## 2017-12-29 DIAGNOSIS — D630 Anemia in neoplastic disease: Secondary | ICD-10-CM | POA: Diagnosis not present

## 2017-12-29 DIAGNOSIS — Z8673 Personal history of transient ischemic attack (TIA), and cerebral infarction without residual deficits: Secondary | ICD-10-CM | POA: Diagnosis not present

## 2017-12-29 DIAGNOSIS — Z9181 History of falling: Secondary | ICD-10-CM | POA: Diagnosis not present

## 2017-12-29 DIAGNOSIS — M4800 Spinal stenosis, site unspecified: Secondary | ICD-10-CM

## 2017-12-29 DIAGNOSIS — Z7982 Long term (current) use of aspirin: Secondary | ICD-10-CM | POA: Diagnosis not present

## 2017-12-29 DIAGNOSIS — F329 Major depressive disorder, single episode, unspecified: Secondary | ICD-10-CM | POA: Diagnosis not present

## 2017-12-29 DIAGNOSIS — I10 Essential (primary) hypertension: Secondary | ICD-10-CM | POA: Diagnosis not present

## 2017-12-29 DIAGNOSIS — M5416 Radiculopathy, lumbar region: Secondary | ICD-10-CM

## 2017-12-29 DIAGNOSIS — M5412 Radiculopathy, cervical region: Secondary | ICD-10-CM | POA: Diagnosis not present

## 2017-12-29 DIAGNOSIS — C4922 Malignant neoplasm of connective and soft tissue of left lower limb, including hip: Secondary | ICD-10-CM | POA: Diagnosis not present

## 2018-01-03 DIAGNOSIS — Z7982 Long term (current) use of aspirin: Secondary | ICD-10-CM | POA: Diagnosis not present

## 2018-01-03 DIAGNOSIS — Z8673 Personal history of transient ischemic attack (TIA), and cerebral infarction without residual deficits: Secondary | ICD-10-CM | POA: Diagnosis not present

## 2018-01-03 DIAGNOSIS — F329 Major depressive disorder, single episode, unspecified: Secondary | ICD-10-CM | POA: Diagnosis not present

## 2018-01-03 DIAGNOSIS — C4922 Malignant neoplasm of connective and soft tissue of left lower limb, including hip: Secondary | ICD-10-CM | POA: Diagnosis not present

## 2018-01-03 DIAGNOSIS — D630 Anemia in neoplastic disease: Secondary | ICD-10-CM | POA: Diagnosis not present

## 2018-01-03 DIAGNOSIS — M5416 Radiculopathy, lumbar region: Secondary | ICD-10-CM | POA: Diagnosis not present

## 2018-01-03 DIAGNOSIS — M4800 Spinal stenosis, site unspecified: Secondary | ICD-10-CM | POA: Diagnosis not present

## 2018-01-03 DIAGNOSIS — E785 Hyperlipidemia, unspecified: Secondary | ICD-10-CM | POA: Diagnosis not present

## 2018-01-03 DIAGNOSIS — I10 Essential (primary) hypertension: Secondary | ICD-10-CM | POA: Diagnosis not present

## 2018-01-03 DIAGNOSIS — Z9181 History of falling: Secondary | ICD-10-CM | POA: Diagnosis not present

## 2018-01-03 DIAGNOSIS — M1991 Primary osteoarthritis, unspecified site: Secondary | ICD-10-CM | POA: Diagnosis not present

## 2018-01-03 DIAGNOSIS — M5412 Radiculopathy, cervical region: Secondary | ICD-10-CM | POA: Diagnosis not present

## 2018-01-04 ENCOUNTER — Inpatient Hospital Stay: Payer: PPO | Attending: Oncology | Admitting: Oncology

## 2018-01-04 VITALS — BP 120/69 | HR 93 | Temp 97.7°F | Resp 18 | Ht 67.0 in | Wt 125.6 lb

## 2018-01-04 DIAGNOSIS — C4922 Malignant neoplasm of connective and soft tissue of left lower limb, including hip: Secondary | ICD-10-CM

## 2018-01-04 NOTE — Progress Notes (Signed)
Reason for the request: Liposarcoma.  HPI: I was asked by Dr. Jenny Reichmann to evaluate Lynn Howell for sarcoma of the lower extremity.  She is a 84 year old woman with few comorbid conditions and currently resides at home with her children.  She was found to have a large lipomatous mass involving the soft tissue of the mid proximal thigh dating back to 2012.  She has been followed at [facility] and repeat imaging studies in 2012 and 2013 showed no growth in that tumor.  She underwent a core biopsy on Jun 09, 2010 which confirmed the presence of well-differentiated liposarcoma.  She did not receive any definitive treatment at that time.  She has not followed at [facility] since 2013 and has not had any recent complaints related to her.  Over the years, she did notice some growth but no major complaints related to it.  She denies any thigh pain or discomfort.  She does report lower extremity edema but is bilateral.  She denies any recent falls or syncope.  She does ambulate with the help of a walker.  She denies any recent constitutional symptoms.  She does not report any headaches, blurry vision, syncope or seizures. Does not report any fevers, chills or sweats.  Does not report any cough, wheezing or hemoptysis.  Does not report any chest pain, palpitation, orthopnea or leg edema.  Does not report any nausea, vomiting or abdominal pain.  Does not report any constipation or diarrhea.  Does not report any skeletal complaints.    Does not report frequency, urgency or hematuria.  Does not report any skin rashes or lesions. Does not report any heat or cold intolerance.  Does not report any lymphadenopathy or petechiae.  Does not report any anxiety or depression.  Remaining review of systems is negative.    Past Medical History:  Diagnosis Date  . ANEMIA-IRON DEFICIENCY 08/01/2007  . ANKLE PAIN, LEFT 01/22/2007  . Arthritis   . BACK PAIN, CHRONIC, INTERMITTENT 02/10/2010  . CERUMEN IMPACTION, RIGHT 04/20/2007  . COLONIC  POLYPS, HX OF 09/21/2006  . COMMON MIGRAINE 09/21/2006  . CONSTIPATION, CHRONIC 09/21/2006  . CONTACT DERMATITIS 08/01/2008  . DEGENERATIVE JOINT DISEASE, KNEES, BILATERAL 03/19/2010  . DEPRESSION 08/01/2007  . Hypercholesteremia   . HYPERLIPIDEMIA 08/01/2007  . Hypertension   . HYPERTENSION 09/21/2006  . KNEE PAIN, BILATERAL 09/15/2008  . Left cervical radiculopathy 03/07/2012  . LEG PAIN, RIGHT 03/03/2008  . 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 LUMBAR RADICULOPATHY, RIGHT 03/19/2010  . NEPHROLITHIASIS, HX OF 09/21/2006  . OSTEOPENIA 12/17/2008  . OSTEOPOROSIS 01/22/2007  . SPINAL STENOSIS 12/17/2008  . Stroke ([place])   . Swelling of limb 03/19/2010  . URI 04/20/2007  :  Past Surgical History:  Procedure Laterality Date  . ABDOMINAL HYSTERECTOMY    . CHOLECYSTECTOMY    . TUBAL LIGATION    :   Current Outpatient Medications:  .  amLODipine (NORVASC) 10 MG tablet, TAKE ONE TABLET EACH DAY, Disp: 90 tablet, Rfl: 1 .  aspirin 325 MG tablet, Take 325 mg by mouth daily.  , Disp: , Rfl:  .  Calcium Carb-Cholecalciferol (CALCIUM + D3 PO), Take 600 mg by mouth., Disp: , Rfl:  .  cholestyramine (QUESTRAN) 4 g packet, Take 1 packet (4 g total) by mouth 3 (three) times daily as needed., Disp: 60 each, Rfl: 5 .  irbesartan-hydrochlorothiazide (AVALIDE) 150-12.5 MG tablet, TAKE ONE TABLET EACH DAY, Disp: 90 tablet, Rfl: 3 .  polyethylene glycol powder (GLYCOLAX/MIRALAX) powder, 17  GRAMS TWICE DAILY, Disp: 527 g, Rfl: 2 .  telmisartan (MICARDIS) 20 MG tablet, Take 1 tablet (20 mg total) by mouth daily., Disp: 90 tablet, Rfl: 3 .  tiZANidine (ZANAFLEX) 2 MG tablet, Take 1 tablet (2 mg total) by mouth every 6 (six) hours as needed for muscle spasms., Disp: 30 tablet, Rfl: 3 .  triamcinolone cream (KENALOG) 0.1 %, Apply 1 application topically 2 (two) times daily., Disp: 30 g, Rfl: 0:  Allergies  Allergen Reactions  . Alendronate Sodium   . Hydrochlorothiazide W-Triamterene   . Norco  [Hydrocodone-Acetaminophen] Nausea Only  :  Family History  Problem Relation Age of Onset  . Cancer Mother   . Cancer Sister   . Hypertension Sister   . Diabetes Brother   . Hypertension Brother   :  Social History   Socioeconomic History  . Marital status: Widowed    Spouse name: Not on file  . Number of children: 4  . Years of education: Not on file  . Highest education level: Not on file  Occupational History  . Occupation: Retired  Scientific laboratory technician  . Financial resource strain: Not on file  . Food insecurity:    Worry: Not on file    Inability: Not on file  . Transportation needs:    Medical: Not on file    Non-medical: Not on file  Tobacco Use  . Smoking status: Never Smoker  . Smokeless tobacco: Never Used  Substance and Sexual Activity  . Alcohol use: No  . Drug use: No  . Sexual activity: Not on file  Lifestyle  . Physical activity:    Days per week: Not on file    Minutes per session: Not on file  . Stress: Not on file  Relationships  . Social connections:    Talks on phone: Not on file    Gets together: Not on file    Attends religious service: Not on file    Active member of club or organization: Not on file    Attends meetings of clubs or organizations: Not on file    Relationship status: Not on file  . Intimate partner violence:    Fear of current or ex partner: Not on file    Emotionally abused: Not on file    Physically abused: Not on file    Forced sexual activity: Not on file  Other Topics Concern  . Not on file  Social History Narrative  . Not on file  :  Pertinent items are noted in HPI.  Exam: Blood pressure 120/69, pulse 93, temperature 97.7 F (36.5 C), temperature source Oral, resp. rate 18, height 5\' 7"  (1.702 m), weight 125 lb 9.6 oz (57 kg), SpO2 100 %.   ECOG 2 General appearance: alert and cooperative appeared without distress. Head: atraumatic without any abnormalities. Eyes: conjunctivae/corneas clear. PERRL.  Sclera  anicteric. Throat: lips, mucosa, and tongue normal; without oral thrush or ulcers. Resp: clear to auscultation bilaterally without rhonchi, wheezes or dullness to percussion. Cardio: regular rate and rhythm, S1, S2 normal.  Bilateral lower extremity edema noted. GI: soft, non-tender; bowel sounds normal; no masses,  no organomegaly Skin: Skin color, texture, turgor normal. No rashes or lesions Lymph nodes: Cervical, supraclavicular, and axillary nodes normal. Neurologic: Grossly normal without any motor, sensory or deep tendon reflexes. Musculoskeletal: Palpable left thigh mass noted.  CBC    Component Value Date/Time   WBC 7.5 11/16/2017 1850   RBC 3.41 (L) 11/16/2017 1850   HGB 10.0 (  L) 11/16/2017 1850   HCT 31.8 (L) 11/16/2017 1850   PLT 230 11/16/2017 1850   MCV 93.3 11/16/2017 1850   MCH 29.3 11/16/2017 1850   MCHC 31.4 11/16/2017 1850   RDW 14.1 11/16/2017 1850   LYMPHSABS 0.9 11/16/2017 1850   MONOABS 0.5 11/16/2017 1850   EOSABS 0.0 11/16/2017 1850   BASOSABS 0.0 11/16/2017 1850     Assessment and Plan:   84 year old woman with the following  1.  Well-differentiated liposarcoma of the left thigh diagnosed in 2012.  At that time she presented with a 30 x 10 x 8 cm mass in the anterior compartment of the left thigh that has grown slowly since that time.  She was evaluated by [facility] orthopedics and no definitive treatment was done and has been on active surveillance.  She has not been seen there since 2013.  The natural course of this disease as well as imaging studies were reviewed today with the patient and her son.  She clearly has a lipomatous tumor that is well-differentiated and has grown very slowly over period of 7 years.  She is clearly not a candidate for any definitive therapy.  Surgical intervention will require complete amputation and disarticulation of joints that is unnecessary and she will not tolerate.  The role of radiation therapy and systemic therapy were  reviewed and I feel is very limited in this situation.  After discussion today, given the well-differentiated nature of this tumor as well as the slow-growing natural course of this disease, I have recommended no intervention at this time.  Given her age and limited performance status, supportive management of symptoms develop in the future would be the best course of action.  She develops pain, pain medication may be needed.  At this time she is completely asymptomatic from it.  It is very likely that other health conditions might cause her issues will be for any further growth of this tumor is noticeable.  2.  Bilateral lower extremity edema: Unrelated to her lipomatous tumor.  Likely related to other causes.  3.  Follow-up: I am happy to see her in the future as needed.  40  minutes was spent with the patient face-to-face today.  More than 50% of time was dedicated to reviewing the natural course of her disease, imaging studies and discussing treatment options.    Thank you for the referral.  A copy of this consult has been forwarded to the requesting physician.

## 2018-01-05 ENCOUNTER — Telehealth: Payer: Self-pay

## 2018-01-05 NOTE — Telephone Encounter (Signed)
Pt's son stated that [employer] denied services due to the evaluation done by the nurses that came out. He read the letter off to me while we were on the phone. However, the letter was vague. I suggest that he call the insurance company to get the specifics on why it was denied and see if a letter from the PCP would suffice in getting that determination changed. He stated that he would reach out them and call back with additional information.   Copied from [place] [id number]. Topic: General - Other >> Jan 05, 2018 10:32 AM Lynn Howell wrote: Reason for CRM: Lynn Howell pt son states that he would like to speak with someone about an appeal letter he state that a therapist and a home health nurse came to pt home  to do assessment and didn't get approved  please call Mr Lynn Howell back at [phone]

## 2018-01-11 DIAGNOSIS — Z7982 Long term (current) use of aspirin: Secondary | ICD-10-CM | POA: Diagnosis not present

## 2018-01-11 DIAGNOSIS — Z8673 Personal history of transient ischemic attack (TIA), and cerebral infarction without residual deficits: Secondary | ICD-10-CM | POA: Diagnosis not present

## 2018-01-11 DIAGNOSIS — F329 Major depressive disorder, single episode, unspecified: Secondary | ICD-10-CM | POA: Diagnosis not present

## 2018-01-11 DIAGNOSIS — M5412 Radiculopathy, cervical region: Secondary | ICD-10-CM | POA: Diagnosis not present

## 2018-01-11 DIAGNOSIS — E785 Hyperlipidemia, unspecified: Secondary | ICD-10-CM | POA: Diagnosis not present

## 2018-01-11 DIAGNOSIS — D630 Anemia in neoplastic disease: Secondary | ICD-10-CM | POA: Diagnosis not present

## 2018-01-11 DIAGNOSIS — M5416 Radiculopathy, lumbar region: Secondary | ICD-10-CM | POA: Diagnosis not present

## 2018-01-11 DIAGNOSIS — C4922 Malignant neoplasm of connective and soft tissue of left lower limb, including hip: Secondary | ICD-10-CM | POA: Diagnosis not present

## 2018-01-11 DIAGNOSIS — I10 Essential (primary) hypertension: Secondary | ICD-10-CM | POA: Diagnosis not present

## 2018-01-11 DIAGNOSIS — Z9181 History of falling: Secondary | ICD-10-CM | POA: Diagnosis not present

## 2018-01-11 DIAGNOSIS — M4800 Spinal stenosis, site unspecified: Secondary | ICD-10-CM | POA: Diagnosis not present

## 2018-01-11 DIAGNOSIS — M1991 Primary osteoarthritis, unspecified site: Secondary | ICD-10-CM | POA: Diagnosis not present

## 2018-01-16 DIAGNOSIS — M5412 Radiculopathy, cervical region: Secondary | ICD-10-CM | POA: Diagnosis not present

## 2018-01-16 DIAGNOSIS — Z9181 History of falling: Secondary | ICD-10-CM | POA: Diagnosis not present

## 2018-01-16 DIAGNOSIS — M4800 Spinal stenosis, site unspecified: Secondary | ICD-10-CM | POA: Diagnosis not present

## 2018-01-16 DIAGNOSIS — F329 Major depressive disorder, single episode, unspecified: Secondary | ICD-10-CM | POA: Diagnosis not present

## 2018-01-16 DIAGNOSIS — M5416 Radiculopathy, lumbar region: Secondary | ICD-10-CM | POA: Diagnosis not present

## 2018-01-16 DIAGNOSIS — D630 Anemia in neoplastic disease: Secondary | ICD-10-CM | POA: Diagnosis not present

## 2018-01-16 DIAGNOSIS — M1991 Primary osteoarthritis, unspecified site: Secondary | ICD-10-CM | POA: Diagnosis not present

## 2018-01-16 DIAGNOSIS — C4922 Malignant neoplasm of connective and soft tissue of left lower limb, including hip: Secondary | ICD-10-CM | POA: Diagnosis not present

## 2018-01-16 DIAGNOSIS — Z8673 Personal history of transient ischemic attack (TIA), and cerebral infarction without residual deficits: Secondary | ICD-10-CM | POA: Diagnosis not present

## 2018-01-16 DIAGNOSIS — Z7982 Long term (current) use of aspirin: Secondary | ICD-10-CM | POA: Diagnosis not present

## 2018-01-16 DIAGNOSIS — E785 Hyperlipidemia, unspecified: Secondary | ICD-10-CM | POA: Diagnosis not present

## 2018-01-16 DIAGNOSIS — I10 Essential (primary) hypertension: Secondary | ICD-10-CM | POA: Diagnosis not present

## 2018-03-21 ENCOUNTER — Encounter: Payer: Self-pay | Admitting: Internal Medicine

## 2018-03-21 ENCOUNTER — Ambulatory Visit (INDEPENDENT_AMBULATORY_CARE_PROVIDER_SITE_OTHER): Payer: PPO | Admitting: Internal Medicine

## 2018-03-21 ENCOUNTER — Other Ambulatory Visit (INDEPENDENT_AMBULATORY_CARE_PROVIDER_SITE_OTHER): Payer: PPO

## 2018-03-21 VITALS — BP 114/62 | HR 94 | Temp 98.8°F | Ht 67.0 in | Wt 126.0 lb

## 2018-03-21 DIAGNOSIS — Z0001 Encounter for general adult medical examination with abnormal findings: Secondary | ICD-10-CM

## 2018-03-21 DIAGNOSIS — R21 Rash and other nonspecific skin eruption: Secondary | ICD-10-CM

## 2018-03-21 DIAGNOSIS — Z0000 Encounter for general adult medical examination without abnormal findings: Secondary | ICD-10-CM

## 2018-03-21 LAB — LIPID PANEL
CHOL/HDL RATIO: 2
Cholesterol: 194 mg/dL (ref 0–200)
HDL: 92.3 mg/dL (ref >39.00)
LDL CALC: 91 mg/dL (ref 0–99)
NONHDL: 101.29
Triglycerides: 51 mg/dL (ref 0.0–149.0)
VLDL: 10.2 mg/dL (ref 0.0–40.0)

## 2018-03-21 LAB — BASIC METABOLIC PANEL
BUN: 15 mg/dL (ref 6–23)
CHLORIDE: 105 meq/L (ref 96–112)
CO2: 27 meq/L (ref 19–32)
Calcium: 9.4 mg/dL (ref 8.4–10.5)
Creatinine, Ser: 1.14 mg/dL (ref 0.40–1.20)
GFR: 54.14 mL/min — AB (ref >60.00)
GLUCOSE: 75 mg/dL (ref 70–99)
POTASSIUM: 3.6 meq/L (ref 3.5–5.1)
SODIUM: 140 meq/L (ref 135–145)

## 2018-03-21 LAB — CBC WITH DIFFERENTIAL/PLATELET
BASOS PCT: 1.4 % (ref 0.0–3.0)
Basophils Absolute: 0.1 10*3/uL (ref 0.0–0.1)
EOS ABS: 0 10*3/uL (ref 0.0–0.7)
Eosinophils Relative: 0.8 % (ref 0.0–5.0)
HCT: 29.1 % — ABNORMAL LOW (ref 36.0–46.0)
Hemoglobin: 9.7 g/dL — ABNORMAL LOW (ref 12.0–15.0)
LYMPHS ABS: 1.2 10*3/uL (ref 0.7–4.0)
Lymphocytes Relative: 29.7 % (ref 12.0–46.0)
MCHC: 33.4 g/dL (ref 30.0–36.0)
MCV: 87.7 fl (ref 78.0–100.0)
MONO ABS: 0.3 10*3/uL (ref 0.1–1.0)
Monocytes Relative: 7.7 % (ref 3.0–12.0)
NEUTROS PCT: 60.4 % (ref 43.0–77.0)
Neutro Abs: 2.5 10*3/uL (ref 1.4–7.7)
Platelets: 253 10*3/uL (ref 150.0–400.0)
RBC: 3.32 Mil/uL — ABNORMAL LOW (ref 3.87–5.11)
RDW: 15.3 % (ref 11.5–15.5)
WBC: 4.1 10*3/uL (ref 4.0–10.5)

## 2018-03-21 LAB — HEPATIC FUNCTION PANEL
ALT: 16 U/L (ref 0–35)
AST: 23 U/L (ref 0–37)
Albumin: 3.9 g/dL (ref 3.5–5.2)
Alkaline Phosphatase: 80 U/L (ref 39–117)
BILIRUBIN DIRECT: 0.1 mg/dL (ref 0.0–0.3)
BILIRUBIN TOTAL: 0.3 mg/dL (ref 0.2–1.2)
Total Protein: 7 g/dL (ref 6.0–8.3)

## 2018-03-21 LAB — TSH: TSH: 3.65 u[IU]/mL (ref 0.35–4.50)

## 2018-03-21 MED ORDER — CLOTRIMAZOLE-BETAMETHASONE 1-0.05 % EX CREA: TOPICAL_CREAM | CUTANEOUS | 1 refills | Status: DC

## 2018-03-21 NOTE — Progress Notes (Signed)
Subjective:    Patient ID: Lynn Howell, female    DOB: 08-Nov-1925, 85 y.o.   MRN: [id number]  HPI  Here for wellness and f/u;  Overall doing ok;  Pt denies Chest pain, worsening SOB, DOE, wheezing, orthopnea, PND, worsening LE edema, palpitations, dizziness or syncope.  Pt denies neurological change such as new headache, facial or extremity weakness.  Pt denies polydipsia, polyuria, or low sugar symptoms. Pt states overall good compliance with treatment and medications, good tolerability, and has been trying to follow appropriate diet.  Pt denies worsening depressive symptoms, suicidal ideation or panic. No fever, night sweats, wt loss, loss of appetite, or other constitutional symptoms.  Pt states good ability with ADL's, has low fall risk, home safety reviewed and adequate, no other significant changes in hearing or vision, and not active with exercise.  Tends to not use the walker in the home as she is supposed to.  Does have an itchy scaly rash only over the post right hand at the 3rd mcp area Past Medical History:  Diagnosis Date  . ANEMIA-IRON DEFICIENCY 08/01/2007  . ANKLE PAIN, LEFT 01/22/2007  . Arthritis   . BACK PAIN, CHRONIC, INTERMITTENT 02/10/2010  . CERUMEN IMPACTION, RIGHT 04/20/2007  . COLONIC POLYPS, HX OF 09/21/2006  . COMMON MIGRAINE 09/21/2006  . CONSTIPATION, CHRONIC 09/21/2006  . CONTACT DERMATITIS 08/01/2008  . DEGENERATIVE JOINT DISEASE, KNEES, BILATERAL 03/19/2010  . DEPRESSION 08/01/2007  . Hypercholesteremia   . HYPERLIPIDEMIA 08/01/2007  . Hypertension   . HYPERTENSION 09/21/2006  . KNEE PAIN, BILATERAL 09/15/2008  . Left cervical radiculopathy 03/07/2012  . LEG PAIN, RIGHT 03/03/2008  . 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 LUMBAR RADICULOPATHY, RIGHT 03/19/2010  . NEPHROLITHIASIS, HX OF 09/21/2006  . OSTEOPENIA 12/17/2008  . OSTEOPOROSIS 01/22/2007  . SPINAL STENOSIS 12/17/2008  . Stroke ([place])   . Swelling of limb 03/19/2010  . URI 04/20/2007   Past Surgical  History:  Procedure Laterality Date  . ABDOMINAL HYSTERECTOMY    . CHOLECYSTECTOMY    . TUBAL LIGATION      reports that she has never smoked. She has never used smokeless tobacco. She reports that she does not drink alcohol or use drugs. family history includes Cancer in her mother and sister; Diabetes in her brother; Hypertension in her brother and sister. Allergies  Allergen Reactions  . Alendronate Sodium   . Hydrochlorothiazide W-Triamterene   . Norco [Hydrocodone-Acetaminophen] Nausea Only   Current Outpatient Medications on File Prior to Visit  Medication Sig Dispense Refill  . amLODipine (NORVASC) 10 MG tablet TAKE ONE TABLET EACH DAY 90 tablet 1  . aspirin 325 MG tablet Take 325 mg by mouth daily.      . Calcium Carb-Cholecalciferol (CALCIUM + D3 PO) Take 600 mg by mouth.    . cholestyramine (QUESTRAN) 4 g packet Take 1 packet (4 g total) by mouth 3 (three) times daily as needed. 60 each 5  . irbesartan-hydrochlorothiazide (AVALIDE) 150-12.5 MG tablet TAKE ONE TABLET EACH DAY 90 tablet 3  . polyethylene glycol powder (GLYCOLAX/MIRALAX) powder 17 GRAMS TWICE DAILY 527 g 2  . telmisartan (MICARDIS) 20 MG tablet Take 1 tablet (20 mg total) by mouth daily. 90 tablet 3  . tiZANidine (ZANAFLEX) 2 MG tablet Take 1 tablet (2 mg total) by mouth every 6 (six) hours as needed for muscle spasms. 30 tablet 3  . triamcinolone cream (KENALOG) 0.1 % Apply 1 application topically 2 (two) times daily. 30 g 0  No current facility-administered medications on file prior to visit.    Review of Systems Constitutional: Negative for other unusual diaphoresis, sweats, appetite or weight changes HENT: Negative for other worsening hearing loss, ear pain, facial swelling, mouth sores or neck stiffness.   Eyes: Negative for other worsening pain, redness or other visual disturbance.  Respiratory: Negative for other stridor or swelling Cardiovascular: Negative for other palpitations or other chest pain    Gastrointestinal: Negative for worsening diarrhea or loose stools, blood in stool, distention or other pain Genitourinary: Negative for hematuria, flank pain or other change in urine volume.  Musculoskeletal: Negative for myalgias or other joint swelling.  Skin: Negative for other color change, or other wound or worsening drainage.  Neurological: Negative for other syncope or numbness. Hematological: Negative for other adenopathy or swelling Psychiatric/Behavioral: Negative for hallucinations, other worsening agitation, SI, self-injury, or new decreased concentration All other system neg per pt    Objective:   Physical Exam BP 114/62   Pulse 94   Temp 98.8 F (37.1 C) (Oral)   Ht 5\' 7"  (1.702 m)   Wt 126 lb (57.2 kg)   SpO2 97%   BMI 19.73 kg/m  VS noted,  Constitutional: Pt is oriented to person, place, and time. Appears well-developed and well-nourished, in no significant distress and comfortable Head: Normocephalic and atraumatic  Eyes: Conjunctivae and EOM are normal. Pupils are equal, round, and reactive to light Right Ear: External ear normal without discharge Left Ear: External ear normal without discharge Nose: Nose without discharge or deformity Mouth/Throat: Oropharynx is without other ulcerations and moist  Neck: Normal range of motion. Neck supple. No JVD present. No tracheal deviation present or significant neck LA or mass Cardiovascular: Normal rate, regular rhythm, normal heart sounds and intact distal pulses.   Pulmonary/Chest: WOB normal and breath sounds without rales or wheezing  Abdominal: Soft. Bowel sounds are normal. NT. No HSM  Musculoskeletal: Normal range of motion. Exhibits no edema Lymphadenopathy: Has no other cervical adenopathy.  Neurological: Pt is alert and oriented to person, place, and time. Pt has normal reflexes. No cranial nerve deficit. Motor grossly intact, Gait intact Skin: Skin is warm and dry. + scaly erythem non tender rash noted only  over the right post mcp Psychiatric:  Has normal mood and affect. Behavior is normal without agitation\ No other exam findings Lab Results  Component Value Date   WBC 4.1 03/21/2018   HGB 9.7 (L) 03/21/2018   HCT 29.1 (L) 03/21/2018   PLT 253.0 03/21/2018   GLUCOSE 75 03/21/2018   CHOL 194 03/21/2018   TRIG 51.0 03/21/2018   HDL 92.30 03/21/2018   LDLDIRECT 134.5 11/01/2012   LDLCALC 91 03/21/2018   ALT 16 03/21/2018   AST 23 03/21/2018   NA 140 03/21/2018   K 3.6 03/21/2018   CL 105 03/21/2018   CREATININE 1.14 03/21/2018   BUN 15 03/21/2018   CO2 27 03/21/2018   TSH 3.65 03/21/2018       Assessment & Plan:

## 2018-03-21 NOTE — Patient Instructions (Signed)
Please take all new medication as prescribed - the cream  Please continue all other medications as before, and refills have been done if requested.  Please have the pharmacy call with any other refills you may need.  Please continue your efforts at being more active, low cholesterol diet, and weight control.  You are otherwise up to date with prevention measures today.  Please keep your appointments with your specialists as you may have planned  Please go to the LAB in the Basement (turn left off the elevator) for the tests to be done today  You will be contacted by phone if any changes need to be made immediately.  Otherwise, you will receive a letter about your results with an explanation, but please check with MyChart first.  Please remember to sign up for MyChart if you have not done so, as this will be important to you in the future with finding out test results, communicating by private email, and scheduling acute appointments online when needed.  Please return in 6 months, or sooner if needed 

## 2018-03-21 NOTE — Assessment & Plan Note (Signed)

## 2018-03-21 NOTE — Assessment & Plan Note (Signed)
Mild to mod, for lotrisone prn,  to f/u any worsening symptoms or concerns

## 2018-03-22 ENCOUNTER — Ambulatory Visit (INDEPENDENT_AMBULATORY_CARE_PROVIDER_SITE_OTHER): Payer: PPO | Admitting: Podiatry

## 2018-03-22 ENCOUNTER — Other Ambulatory Visit (INDEPENDENT_AMBULATORY_CARE_PROVIDER_SITE_OTHER): Payer: PPO

## 2018-03-22 ENCOUNTER — Encounter: Payer: Self-pay | Admitting: Podiatry

## 2018-03-22 DIAGNOSIS — Z0000 Encounter for general adult medical examination without abnormal findings: Secondary | ICD-10-CM

## 2018-03-22 DIAGNOSIS — M79675 Pain in left toe(s): Secondary | ICD-10-CM

## 2018-03-22 DIAGNOSIS — M79674 Pain in right toe(s): Secondary | ICD-10-CM

## 2018-03-22 DIAGNOSIS — B351 Tinea unguium: Secondary | ICD-10-CM | POA: Diagnosis not present

## 2018-03-22 DIAGNOSIS — L84 Corns and callosities: Secondary | ICD-10-CM

## 2018-03-22 LAB — URINALYSIS, ROUTINE W REFLEX MICROSCOPIC
BILIRUBIN URINE: NEGATIVE
HGB URINE DIPSTICK: NEGATIVE
Ketones, ur: NEGATIVE
Leukocytes,Ua: NEGATIVE
NITRITE: NEGATIVE
RBC / HPF: NONE SEEN (ref 0–?)
Specific Gravity, Urine: 1.02 (ref 1.000–1.030)
Total Protein, Urine: NEGATIVE
UROBILINOGEN UA: 0.2 (ref 0.0–1.0)
Urine Glucose: NEGATIVE
pH: 7 (ref 5.0–8.0)

## 2018-03-22 NOTE — Patient Instructions (Signed)

## 2018-03-22 NOTE — Progress Notes (Signed)
Subjective: Lynn Howell presents today with history of painful, mycotic toenails.  Pain is aggravated when wearing enclosed shoe gear and relieved with periodic professional debridement.   Biagio Borg, MD is her  PCP.    Current Outpatient Medications:  .  amLODipine (NORVASC) 10 MG tablet, TAKE ONE TABLET EACH DAY, Disp: 90 tablet, Rfl: 1 .  aspirin 325 MG tablet, Take 325 mg by mouth daily.  , Disp: , Rfl:  .  Calcium Carb-Cholecalciferol (CALCIUM + D3 PO), Take 600 mg by mouth., Disp: , Rfl:  .  cholestyramine (QUESTRAN) 4 g packet, Take 1 packet (4 g total) by mouth 3 (three) times daily as needed., Disp: 60 each, Rfl: 5 .  clotrimazole-betamethasone (LOTRISONE) cream, Use as directed twice per day as needed to affected areas, Disp: 15 g, Rfl: 1 .  irbesartan-hydrochlorothiazide (AVALIDE) 150-12.5 MG tablet, TAKE ONE TABLET EACH DAY, Disp: 90 tablet, Rfl: 3 .  polyethylene glycol powder (GLYCOLAX/MIRALAX) powder, 17 GRAMS TWICE DAILY, Disp: 527 g, Rfl: 2 .  telmisartan (MICARDIS) 20 MG tablet, Take 1 tablet (20 mg total) by mouth daily., Disp: 90 tablet, Rfl: 3 .  tiZANidine (ZANAFLEX) 2 MG tablet, Take 1 tablet (2 mg total) by mouth every 6 (six) hours as needed for muscle spasms., Disp: 30 tablet, Rfl: 3 .  triamcinolone cream (KENALOG) 0.1 %, Apply 1 application topically 2 (two) times daily., Disp: 30 g, Rfl: 0  Allergies  Allergen Reactions  . Alendronate Sodium   . Hydrochlorothiazide W-Triamterene   . Norco [Hydrocodone-Acetaminophen] Nausea Only    Objective:  Vascular Examination: Capillary refill time immediate x 10 digits.  Dorsalis pedis and Posterior tibial pulses faintly palpable b/l.  Digital hair x 10 digits was absent.  Skin temperature gradient WNL b/l.  Dermatological Examination: Skin thin and atrophic b/l.  Toenails 1-5 b/l discolored, thick, dystrophic with subungual debris and pain with palpation to nailbeds due to thickness of  nails.  Hyperkeratotic lesion distal tip right 3rd digit. No erythema, no edema, no drainage, no flocculence.  Musculoskeletal: Muscle strength 5/5 to all muscle groups b/l  Rigid hammertoe deformity 2-5 b/l.  Neurological: Sensation with 10 gram monofilament is absent b/l.  Vibratory sensation absent b/l.  Assessment: 1. Painful onychomycosis toenails 1-5 b/l 2. Corn distal tip right 3rd digit  Plan: 1. Toenails 1-5 b/l were debrided in length and girth without iatrogenic bleeding. Corn(s) pared distal tip right 3rd digit with sterile scalpel blade without incident. 2. Patient to continue soft, supportive shoe gear. 3. Patient to report any pedal injuries to medical professional. 4. Follow up 3 months.  5. Patient/POA to call should there be a concern in the interim.

## 2018-03-23 ENCOUNTER — Encounter: Payer: Self-pay | Admitting: Internal Medicine

## 2018-05-08 ENCOUNTER — Other Ambulatory Visit: Payer: Self-pay | Admitting: Internal Medicine

## 2018-05-22 ENCOUNTER — Telehealth: Payer: Self-pay | Admitting: Internal Medicine

## 2018-05-22 ENCOUNTER — Other Ambulatory Visit: Payer: Self-pay | Admitting: Internal Medicine

## 2018-05-22 MED ORDER — AMLODIPINE BESYLATE 10 MG PO TABS: ORAL_TABLET | ORAL | 1 refills | Status: DC

## 2018-05-22 NOTE — Telephone Encounter (Signed)
Copied from [place] [phone]. Topic: Quick Communication - Rx Refill/Question >> May 22, 2018  2:43 PM Mcneil, Ja-Kwan wrote: Medication: amLODipine (NORVASC) 10 MG tablet  Has the patient contacted their pharmacy? yes   Preferred Pharmacy (with phone number or street name): [facility], [place] - [address] [phone] (Phone) [phone] (Fax)  Agent: Please be advised that RX refills may take up to 3 business days. We ask that you follow-up with your pharmacy.

## 2018-06-28 ENCOUNTER — Ambulatory Visit: Payer: PPO | Admitting: Podiatry

## 2018-07-10 ENCOUNTER — Ambulatory Visit: Payer: PPO | Admitting: Podiatry

## 2018-07-10 ENCOUNTER — Encounter: Payer: Self-pay | Admitting: Podiatry

## 2018-07-10 ENCOUNTER — Other Ambulatory Visit: Payer: Self-pay

## 2018-07-10 VITALS — Temp 97.2°F

## 2018-07-10 DIAGNOSIS — L84 Corns and callosities: Secondary | ICD-10-CM

## 2018-07-10 DIAGNOSIS — M79675 Pain in left toe(s): Secondary | ICD-10-CM | POA: Diagnosis not present

## 2018-07-10 DIAGNOSIS — I872 Venous insufficiency (chronic) (peripheral): Secondary | ICD-10-CM | POA: Diagnosis not present

## 2018-07-10 DIAGNOSIS — L872 Elastosis perforans serpiginosa: Secondary | ICD-10-CM | POA: Diagnosis not present

## 2018-07-10 DIAGNOSIS — R600 Localized edema: Secondary | ICD-10-CM | POA: Diagnosis not present

## 2018-07-10 DIAGNOSIS — B351 Tinea unguium: Secondary | ICD-10-CM | POA: Diagnosis not present

## 2018-07-10 DIAGNOSIS — M79674 Pain in right toe(s): Secondary | ICD-10-CM | POA: Diagnosis not present

## 2018-07-10 DIAGNOSIS — G609 Hereditary and idiopathic neuropathy, unspecified: Secondary | ICD-10-CM

## 2018-07-10 NOTE — Patient Instructions (Signed)

## 2018-07-11 ENCOUNTER — Telehealth: Payer: Self-pay | Admitting: Podiatry

## 2018-07-11 NOTE — Telephone Encounter (Signed)
We have not received the Rx for the Knee highs that was suppose to be sent per Dr. Elisha Ponder , to  [facility]. The office address is [address] Fax# [phone]

## 2018-07-18 NOTE — Progress Notes (Signed)
Subjective: Lynn Howell presents to clinic with cc of painful mycotic toenails and painful corn distal tip right 3rd digit which are aggravated when weightbearing with and without shoe gear.  This pain limits her daily activities. Pain symptoms resolve with periodic professional debridement.  Biagio Borg, MD is her PCP. Last visit was 03/22/2018.   Current Outpatient Medications:  .  amLODipine (NORVASC) 10 MG tablet, TAKE ONE TABLET EACH DAY, Disp: 90 tablet, Rfl: 1 .  aspirin 325 MG tablet, Take 325 mg by mouth daily.  , Disp: , Rfl:  .  Calcium Carb-Cholecalciferol (CALCIUM + D3 PO), Take 600 mg by mouth., Disp: , Rfl:  .  cholestyramine (QUESTRAN) 4 g packet, Take 1 packet (4 g total) by mouth 3 (three) times daily as needed., Disp: 60 each, Rfl: 5 .  clotrimazole-betamethasone (LOTRISONE) cream, Use as directed twice per day as needed to affected areas, Disp: 15 g, Rfl: 1 .  irbesartan-hydrochlorothiazide (AVALIDE) 150-12.5 MG tablet, TAKE ONE TABLET EACH DAY, Disp: 90 tablet, Rfl: 3 .  polyethylene glycol powder (GLYCOLAX/MIRALAX) powder, 17 GRAMS TWICE DAILY, Disp: 527 g, Rfl: 2 .  telmisartan (MICARDIS) 20 MG tablet, Take 1 tablet (20 mg total) by mouth daily., Disp: 90 tablet, Rfl: 3 .  tiZANidine (ZANAFLEX) 2 MG tablet, Take 1 tablet (2 mg total) by mouth every 6 (six) hours as needed for muscle spasms., Disp: 30 tablet, Rfl: 3 .  triamcinolone cream (KENALOG) 0.1 %, Apply 1 application topically 2 (two) times daily., Disp: 30 g, Rfl: 0   Allergies  Allergen Reactions  . Alendronate Sodium   . Hydrochlorothiazide W-Triamterene   . Norco [Hydrocodone-Acetaminophen] Nausea Only     Objective: Vitals:   07/10/18 0838  Temp: (!) 97.2 F (36.2 C)    Physical Examination:  Vascular  Examination: Capillary refill time immediate x10 digits.  Dorsalis pedis posterior tibial pulses are faintly palpable bilaterally.  Digital hair was absent x10 digits.  Skin temperature  gradient within normal limits bilaterally.  Edema noted bilateral lower extremities.  There is no cellulitis noted.  No pain with calf compression noted bilaterally.  Dermatological Examination: Pedal skin noted to be thin and atrophic bilaterally.  Venous stasis skin skin changes noted bilateral lower extremities. Toenails 1 through 5 bilaterally are discolored, thick, dystrophic with subungual debris and pain on palpation.  There is no erythema, no edema, no drainage, no flocculence noted.  Hyperkeratotic lesion noted distal tip of right third digit with tenderness to palpation.  There is no erythema, no edema, no drainage, no flocculence noted.  Musculoskeletal Examination: Muscle strength 5/5 to all muscle groups b/l.  Rigid hammertoe deformity noted digits 2 through 5 bilaterally.  Neurological Examination: Sensation absent bilaterally l with 10 gram monofilament. Vibratory sensation absent bilaterally.  Assessment: 1. Mycotic nail infection with pain 1-5 b/l 2. Corn distal tip right third digit 3. Bilateral lower extremity edema secondary to venous insufficiency 4. Peripheral neuropathy  Plan: 1. Toenails 1-5 b/l were debrided in length and girth without iatrogenic laceration. Corn(s) pared right third digit utilizing sterile scalpel blade without incident. 2. Discussed lower extremity edema and management includes compression kneehighs.  Prescription written for 2 pair of compression kneehighs, 20 to 50mmHg.  Patient given prescription on [facility] medical supply of prescription form.  Patient is to apply her compression hose every morning and remove every evening after dinnertime. 3. Continue soft, supportive shoe gear daily. 4. Report any pedal injuries to medical professional. 5. Follow up 3  months. 6. Patient/POA to call should there be a question/concern in there interim.

## 2018-07-23 ENCOUNTER — Observation Stay (HOSPITAL_COMMUNITY)
Admission: EM | Admit: 2018-07-23 | Discharge: 2018-07-24 | Disposition: A | Discharge status: Home / Self Care | Payer: PPO | Attending: Internal Medicine | Admitting: Internal Medicine

## 2018-07-23 ENCOUNTER — Emergency Department (HOSPITAL_COMMUNITY): Payer: PPO

## 2018-07-23 ENCOUNTER — Other Ambulatory Visit: Payer: Self-pay

## 2018-07-23 ENCOUNTER — Encounter (HOSPITAL_COMMUNITY): Payer: Self-pay

## 2018-07-23 DIAGNOSIS — M1990 Unspecified osteoarthritis, unspecified site: Secondary | ICD-10-CM | POA: Diagnosis not present

## 2018-07-23 DIAGNOSIS — D509 Iron deficiency anemia, unspecified: Secondary | ICD-10-CM | POA: Diagnosis present

## 2018-07-23 DIAGNOSIS — N183 Chronic kidney disease, stage 3 (moderate): Secondary | ICD-10-CM | POA: Diagnosis not present

## 2018-07-23 DIAGNOSIS — S065XAA Traumatic subdural hemorrhage with loss of consciousness status unknown, initial encounter: Secondary | ICD-10-CM | POA: Diagnosis present

## 2018-07-23 DIAGNOSIS — R262 Difficulty in walking, not elsewhere classified: Secondary | ICD-10-CM | POA: Insufficient documentation

## 2018-07-23 DIAGNOSIS — G8929 Other chronic pain: Secondary | ICD-10-CM | POA: Insufficient documentation

## 2018-07-23 DIAGNOSIS — Z8673 Personal history of transient ischemic attack (TIA), and cerebral infarction without residual deficits: Secondary | ICD-10-CM | POA: Insufficient documentation

## 2018-07-23 DIAGNOSIS — M6281 Muscle weakness (generalized): Secondary | ICD-10-CM | POA: Diagnosis not present

## 2018-07-23 DIAGNOSIS — C4922 Malignant neoplasm of connective and soft tissue of left lower limb, including hip: Secondary | ICD-10-CM | POA: Diagnosis not present

## 2018-07-23 DIAGNOSIS — W19XXXA Unspecified fall, initial encounter: Secondary | ICD-10-CM | POA: Diagnosis not present

## 2018-07-23 DIAGNOSIS — Z1159 Encounter for screening for other viral diseases: Secondary | ICD-10-CM | POA: Insufficient documentation

## 2018-07-23 DIAGNOSIS — S065X9A Traumatic subdural hemorrhage with loss of consciousness of unspecified duration, initial encounter: Principal | ICD-10-CM | POA: Insufficient documentation

## 2018-07-23 DIAGNOSIS — E7800 Pure hypercholesterolemia, unspecified: Secondary | ICD-10-CM | POA: Insufficient documentation

## 2018-07-23 DIAGNOSIS — F329 Major depressive disorder, single episode, unspecified: Secondary | ICD-10-CM | POA: Diagnosis not present

## 2018-07-23 DIAGNOSIS — I129 Hypertensive chronic kidney disease with stage 1 through stage 4 chronic kidney disease, or unspecified chronic kidney disease: Secondary | ICD-10-CM | POA: Insufficient documentation

## 2018-07-23 DIAGNOSIS — E785 Hyperlipidemia, unspecified: Secondary | ICD-10-CM | POA: Diagnosis not present

## 2018-07-23 DIAGNOSIS — C4920 Malignant neoplasm of connective and soft tissue of unspecified lower limb, including hip: Secondary | ICD-10-CM | POA: Diagnosis present

## 2018-07-23 DIAGNOSIS — Z7982 Long term (current) use of aspirin: Secondary | ICD-10-CM | POA: Insufficient documentation

## 2018-07-23 DIAGNOSIS — Z03818 Encounter for observation for suspected exposure to other biological agents ruled out: Secondary | ICD-10-CM | POA: Diagnosis not present

## 2018-07-23 DIAGNOSIS — R609 Edema, unspecified: Secondary | ICD-10-CM | POA: Diagnosis present

## 2018-07-23 DIAGNOSIS — M8580 Other specified disorders of bone density and structure, unspecified site: Secondary | ICD-10-CM | POA: Diagnosis not present

## 2018-07-23 DIAGNOSIS — I10 Essential (primary) hypertension: Secondary | ICD-10-CM | POA: Diagnosis not present

## 2018-07-23 DIAGNOSIS — F0390 Unspecified dementia without behavioral disturbance: Secondary | ICD-10-CM | POA: Diagnosis not present

## 2018-07-23 DIAGNOSIS — R600 Localized edema: Secondary | ICD-10-CM | POA: Diagnosis present

## 2018-07-23 DIAGNOSIS — Z87442 Personal history of urinary calculi: Secondary | ICD-10-CM | POA: Diagnosis not present

## 2018-07-23 DIAGNOSIS — Z9181 History of falling: Secondary | ICD-10-CM | POA: Diagnosis not present

## 2018-07-23 DIAGNOSIS — Z79899 Other long term (current) drug therapy: Secondary | ICD-10-CM | POA: Diagnosis not present

## 2018-07-23 DIAGNOSIS — F419 Anxiety disorder, unspecified: Secondary | ICD-10-CM | POA: Insufficient documentation

## 2018-07-23 DIAGNOSIS — S00511A Abrasion of lip, initial encounter: Secondary | ICD-10-CM | POA: Diagnosis not present

## 2018-07-23 DIAGNOSIS — S199XXA Unspecified injury of neck, initial encounter: Secondary | ICD-10-CM | POA: Diagnosis not present

## 2018-07-23 DIAGNOSIS — Y92009 Unspecified place in unspecified non-institutional (private) residence as the place of occurrence of the external cause: Secondary | ICD-10-CM | POA: Insufficient documentation

## 2018-07-23 DIAGNOSIS — S065X0A Traumatic subdural hemorrhage without loss of consciousness, initial encounter: Secondary | ICD-10-CM | POA: Diagnosis not present

## 2018-07-23 DIAGNOSIS — Z8249 Family history of ischemic heart disease and other diseases of the circulatory system: Secondary | ICD-10-CM | POA: Diagnosis not present

## 2018-07-23 DIAGNOSIS — M549 Dorsalgia, unspecified: Secondary | ICD-10-CM | POA: Insufficient documentation

## 2018-07-23 DIAGNOSIS — M25519 Pain in unspecified shoulder: Secondary | ICD-10-CM

## 2018-07-23 DIAGNOSIS — S0993XA Unspecified injury of face, initial encounter: Secondary | ICD-10-CM | POA: Diagnosis not present

## 2018-07-23 LAB — CBC
HCT: 33.2 % — ABNORMAL LOW (ref 36.0–46.0)
Hemoglobin: 10.6 g/dL — ABNORMAL LOW (ref 12.0–15.0)
MCH: 30.4 pg (ref 26.0–34.0)
MCHC: 31.9 g/dL (ref 30.0–36.0)
MCV: 95.1 fL (ref 80.0–100.0)
Platelets: 236 10*3/uL (ref 150–400)
RBC: 3.49 MIL/uL — ABNORMAL LOW (ref 3.87–5.11)
RDW: 14.6 % (ref 11.5–15.5)
WBC: 6.9 10*3/uL (ref 4.0–10.5)
nRBC: 0 % (ref 0.0–0.2)

## 2018-07-23 LAB — BASIC METABOLIC PANEL
Anion gap: 9 (ref 5–15)
BUN: 20 mg/dL (ref 8–23)
CO2: 26 mmol/L (ref 22–32)
Calcium: 9.7 mg/dL (ref 8.9–10.3)
Chloride: 107 mmol/L (ref 98–111)
Creatinine, Ser: 1.19 mg/dL — ABNORMAL HIGH (ref 0.44–1.00)
GFR calc Af Amer: 47 mL/min — ABNORMAL LOW (ref >60)
GFR calc non Af Amer: 40 mL/min — ABNORMAL LOW (ref >60)
Glucose, Bld: 99 mg/dL (ref 70–99)
Potassium: 3.5 mmol/L (ref 3.5–5.1)
Sodium: 142 mmol/L (ref 135–145)

## 2018-07-23 LAB — SARS CORONAVIRUS 2 BY RT PCR (HOSPITAL ORDER, PERFORMED IN ~~LOC~~ HOSPITAL LAB): SARS Coronavirus 2: NEGATIVE

## 2018-07-23 MED ORDER — SODIUM CHLORIDE 0.9% FLUSH: 3.0000 mL | Freq: Two times a day (BID) | INTRAVENOUS | Status: DC

## 2018-07-23 MED ORDER — ACETAMINOPHEN 325 MG PO TABS
650.0000 mg | ORAL_TABLET | Freq: Four times a day (QID) | ORAL | Status: DC | PRN
  Administered 2018-07-23: 650 mg via ORAL
  Filled 2018-07-23 (×2): qty 2

## 2018-07-23 MED ORDER — SODIUM CHLORIDE 0.9 % IV SOLN: 250.0000 mL | INTRAVENOUS | Status: DC | PRN

## 2018-07-23 MED ORDER — SODIUM CHLORIDE 0.9% FLUSH: 3.0000 mL | INTRAVENOUS | Status: DC | PRN

## 2018-07-23 MED ORDER — ACETAMINOPHEN 650 MG RE SUPP: 650.0000 mg | Freq: Four times a day (QID) | RECTAL | Status: DC | PRN

## 2018-07-23 MED ORDER — AMLODIPINE BESYLATE 5 MG PO TABS
10.0000 mg | ORAL_TABLET | Freq: Every day | ORAL | Status: DC
  Administered 2018-07-23 – 2018-07-24 (×2): 10 mg via ORAL
  Filled 2018-07-23 (×2): qty 2

## 2018-07-23 MED ORDER — POLYETHYLENE GLYCOL 3350 17 G PO PACK
17.0000 g | PACK | Freq: Two times a day (BID) | ORAL | Status: DC
  Administered 2018-07-24: 17 g via ORAL
  Filled 2018-07-23: qty 1

## 2018-07-23 NOTE — H&P (Signed)
History and Physical    Lynn Howell BPZ:[id number] DOB: June 19, 1925 DOA: 07/23/2018  PCP: Biagio Borg, MD   I have briefly reviewed patients previous medical reports in [facility].  Patient coming from: Home  Chief Complaint: Mechanical fall, head injury and small subdural hematoma.  HPI: Lynn Howell is a very pleasant 85 year old female, lives with her daughter, ambulates with the help of a walker, PMH of iron deficiency anemia, arthritis, HLD, HTN, liposarcoma with chronic swelling of left anterior thigh, bilateral leg edema, gait ataxia and prior falls, presented to [facility] ED on 07/23/2018 after an unwitnessed mechanical fall at home with head injury.  Patient provided history which was corroborated with her family.  She was in her usual state of health until this morning when she states that her leg got caught in the screen door and she fell face forward onto her deck.  She denies loss of consciousness.  She reported fair amount of bleeding.  Her daughter heard her fall and alerted her other siblings who are able to assist her and then the son drove her to the ED.  Patient states that she has fallen in the past.  As per family, patient does not like to sit still and likes to keep moving around.  She currently denies any active bleeding,, headache, pain elsewhere, visual abnormalities or strokelike symptoms.  She is asking for something to eat.  She specifically asked for chicken noodle soup.  She denies any other complaints.  ED Course: CT of the head, cervical spine and maxillofacial significant for small left parafalcine subdural hemorrhage without acute facial bone fractures or acute/traumatic cervical spine pathology.  Lab work significant for hemoglobin of 10.6 and creatinine of 1.19.  EDP discussed with neurosurgery on-call who recommended admission to [facility] in observation overnight, consider repeating CT head tomorrow to follow-up on the subdural  hematoma.  Review of Systems:  All other systems reviewed and apart from HPI, are negative.  Past Medical History:  Diagnosis Date   ANEMIA-IRON DEFICIENCY 08/01/2007   ANKLE PAIN, LEFT 01/22/2007   Arthritis    BACK PAIN, CHRONIC, INTERMITTENT 02/10/2010   CERUMEN IMPACTION, RIGHT 04/20/2007   COLONIC POLYPS, HX OF 09/21/2006   COMMON MIGRAINE 09/21/2006   CONSTIPATION, CHRONIC 09/21/2006   CONTACT DERMATITIS 08/01/2008   DEGENERATIVE JOINT DISEASE, KNEES, BILATERAL 03/19/2010   DEPRESSION 08/01/2007   Hypercholesteremia    HYPERLIPIDEMIA 08/01/2007   Hypertension    HYPERTENSION 09/21/2006   KNEE PAIN, BILATERAL 09/15/2008   Left cervical radiculopathy 03/07/2012   LEG PAIN, RIGHT 03/03/2008   Liposarcoma of lower extremity ([place]) 04/14/2010   LUMBAR RADICULOPATHY, RIGHT 03/19/2010   NEPHROLITHIASIS, HX OF 09/21/2006   OSTEOPENIA 12/17/2008   OSTEOPOROSIS 01/22/2007   SPINAL STENOSIS 12/17/2008   Stroke ([place])    Swelling of limb 03/19/2010   URI 04/20/2007    Past Surgical History:  Procedure Laterality Date   ABDOMINAL HYSTERECTOMY     CHOLECYSTECTOMY     TUBAL LIGATION      Social History  reports that she has never smoked. She has never used smokeless tobacco. She reports that she does not drink alcohol or use drugs.  Allergies  Allergen Reactions   Alendronate Sodium    Hydrochlorothiazide W-Triamterene    Norco [Hydrocodone-Acetaminophen] Nausea Only    Family History  Problem Relation Age of Onset   Cancer Mother    Cancer Sister    Hypertension Sister    Diabetes Brother  Hypertension Brother      Prior to Admission medications   Medication Sig Start Date End Date Taking? Authorizing Provider  amLODipine (NORVASC) 10 MG tablet TAKE ONE TABLET EACH DAY 05/22/18  Yes Biagio Borg, MD  aspirin 325 MG tablet Take 325 mg by mouth daily.     Yes [provider]  Calcium Carb-Cholecalciferol (CALCIUM + D3 PO) Take 600 mg by mouth.   Yes  [provider]  irbesartan-hydrochlorothiazide (AVALIDE) 150-12.5 MG tablet TAKE ONE TABLET EACH DAY Patient taking differently: Take 1 tablet by mouth daily. TAKE ONE TABLET EACH DAY 09/27/17  Yes Biagio Borg, MD  polyethylene glycol powder (GLYCOLAX/MIRALAX) powder 17 GRAMS TWICE DAILY Patient taking differently: Take 0.5 Containers by mouth 2 (two) times a day.  02/27/17  Yes Biagio Borg, MD  telmisartan (MICARDIS) 20 MG tablet Take 1 tablet (20 mg total) by mouth daily. 03/08/17  Yes Biagio Borg, MD  triamcinolone cream (KENALOG) 0.1 % Apply 1 application topically 2 (two) times daily. 07/25/17  Yes Hoyt Koch, MD  cholestyramine Lucrezia Starch) 4 g packet Take 1 packet (4 g total) by mouth 3 (three) times daily as needed. Patient taking differently: Take 4 g by mouth 3 (three) times daily as needed (cholesterol).  03/09/16   Biagio Borg, MD  clotrimazole-betamethasone (LOTRISONE) cream Use as directed twice per day as needed to affected areas 03/21/18   Biagio Borg, MD  tiZANidine (ZANAFLEX) 2 MG tablet Take 1 tablet (2 mg total) by mouth every 6 (six) hours as needed for muscle spasms. 09/29/17   Biagio Borg, MD    Physical Exam: Vitals:   07/23/18 1630 07/23/18 1645 07/23/18 1700 07/23/18 1730  BP: (!) 143/72  138/65 137/66  Pulse: 96 96 94 96  Resp: 17 16 17 15   Temp:      TempSrc:      SpO2: 100% 100% 100% 100%  Weight:      Height:          Constitutional: Pleasant elderly female, moderately built and thinly nourished lying comfortably propped up in the gurney in the ED.  Patient was interviewed and examined along with ED RN. Eyes: PERTLA, lids and conjunctivae normal.  Right periorbital ecchymosis extending over to right side of forehead. ENMT: Mucous membranes are moist. Posterior pharynx clear of any exudate or lesions. Normal dentition.  Patient has right lips bruising with some dried blood. Neck: supple, no masses, no thyromegaly Respiratory: clear to  auscultation bilaterally, no wheezing, no crackles. Normal respiratory effort. No accessory muscle use.  Cardiovascular: S1 & S2 heard, regular rate and rhythm, no murmurs / rubs / gallops. No extremity edema. 2+ pedal pulses. No carotid bruits.  Abdomen: No distension, no tenderness, no masses palpated. No hepatosplenomegaly. Bowel sounds normal.  Musculoskeletal: no clubbing / cyanosis. No joint deformity upper and lower extremities. Good ROM, no contractures. Normal muscle tone.  Diffuse chronic swelling of left anterior thigh without acute findings. Skin: no rashes, lesions, ulcers. No induration Neurologic: CN 2-12 grossly intact. Sensation intact, DTR normal. Strength 5/5 in all 4 limbs.  Psychiatric: Slightly impaired judgment and insight. Alert and oriented x 3. Normal mood.     Labs on Admission: I have personally reviewed following labs and imaging studies  CBC: Recent Labs  Lab 07/23/18 1558  WBC 6.9  HGB 10.6*  HCT 33.2*  MCV 95.1  PLT 644   Basic Metabolic Panel: Recent Labs  Lab 07/23/18 1558  NA 142  K 3.5  CL 107  CO2 26  GLUCOSE 99  BUN 20  CREATININE 1.19*  CALCIUM 9.7     Radiological Exams on Admission: Ct Head Wo Contrast  Result Date: 07/23/2018 CLINICAL DATA:  73-year-old female with head trauma. EXAM: CT HEAD WITHOUT CONTRAST CT MAXILLOFACIAL WITHOUT CONTRAST CT CERVICAL SPINE WITHOUT CONTRAST TECHNIQUE: Multidetector CT imaging of the head, cervical spine, and maxillofacial structures were performed using the standard protocol without intravenous contrast. Multiplanar CT image reconstructions of the cervical spine and maxillofacial structures were also generated. COMPARISON:  Head CT dated 11/16/2017 FINDINGS: CT HEAD FINDINGS Brain: There is a small left parafalcine subdural hemorrhage measuring approximately 3-4 mm in thickness. There is moderate age-related atrophy and chronic microvascular ischemic changes. Small bilateral cerebellar hemisphere old  infarcts. No mass effect or midline shift noted. Vascular: No hyperdense vessel or unexpected calcification. Skull: Normal. Negative for fracture or focal lesion. Other: Right forehead and supraorbital contusion/hematoma. CT MAXILLOFACIAL FINDINGS Osseous: No acute fracture. No mandibular subluxation. There is diffuse thickened appearance of the bones of the base of the skull as well as maxilla and mandible which may represent Paget's or related to underlying metabolic disease. Orbits: The globes and retro-orbital fat are preserved. Bilateral cataract surgeries. Sinuses: The visualized paranasal sinuses and mastoid air cells are clear. Soft tissues: Right facial and periorbital hematoma. CT CERVICAL SPINE FINDINGS Alignment: No acute subluxation. There is straightening of normal cervical lordosis which may be positional or due to muscle spasm. Skull base and vertebrae: No acute fracture. The bones are osteopenic. Soft tissues and spinal canal: No prevertebral fluid or swelling. No visible canal hematoma. Disc levels:  Multilevel degenerative changes. Upper chest: Biapical subpleural thickening/scarring. Other: There is hypoattenuation of the vascular blood pool suggestive of a degree of anemia. Clinical correlation is recommended. IMPRESSION: 1. Small left parafalcine subdural hemorrhage. 2. No acute facial bone fractures. 3. No acute/traumatic cervical spine pathology. These results were called by telephone at the time of interpretation on 07/23/2018 at 3:54 pm to Dr. Sedonia Small, who verbally acknowledged these results. Electronically Signed   By: Anner Crete M.D.   On: 07/23/2018 15:57   Ct Cervical Spine Wo Contrast  Result Date: 07/23/2018 CLINICAL DATA:  89-year-old female with head trauma. EXAM: CT HEAD WITHOUT CONTRAST CT MAXILLOFACIAL WITHOUT CONTRAST CT CERVICAL SPINE WITHOUT CONTRAST TECHNIQUE: Multidetector CT imaging of the head, cervical spine, and maxillofacial structures were performed using the  standard protocol without intravenous contrast. Multiplanar CT image reconstructions of the cervical spine and maxillofacial structures were also generated. COMPARISON:  Head CT dated 11/16/2017 FINDINGS: CT HEAD FINDINGS Brain: There is a small left parafalcine subdural hemorrhage measuring approximately 3-4 mm in thickness. There is moderate age-related atrophy and chronic microvascular ischemic changes. Small bilateral cerebellar hemisphere old infarcts. No mass effect or midline shift noted. Vascular: No hyperdense vessel or unexpected calcification. Skull: Normal. Negative for fracture or focal lesion. Other: Right forehead and supraorbital contusion/hematoma. CT MAXILLOFACIAL FINDINGS Osseous: No acute fracture. No mandibular subluxation. There is diffuse thickened appearance of the bones of the base of the skull as well as maxilla and mandible which may represent Paget's or related to underlying metabolic disease. Orbits: The globes and retro-orbital fat are preserved. Bilateral cataract surgeries. Sinuses: The visualized paranasal sinuses and mastoid air cells are clear. Soft tissues: Right facial and periorbital hematoma. CT CERVICAL SPINE FINDINGS Alignment: No acute subluxation. There is straightening of normal cervical lordosis which may be positional or due to  muscle spasm. Skull base and vertebrae: No acute fracture. The bones are osteopenic. Soft tissues and spinal canal: No prevertebral fluid or swelling. No visible canal hematoma. Disc levels:  Multilevel degenerative changes. Upper chest: Biapical subpleural thickening/scarring. Other: There is hypoattenuation of the vascular blood pool suggestive of a degree of anemia. Clinical correlation is recommended. IMPRESSION: 1. Small left parafalcine subdural hemorrhage. 2. No acute facial bone fractures. 3. No acute/traumatic cervical spine pathology. These results were called by telephone at the time of interpretation on 07/23/2018 at 3:54 pm to Dr. Sedonia Small,  who verbally acknowledged these results. Electronically Signed   By: Anner Crete M.D.   On: 07/23/2018 15:57   Ct Maxillofacial Wo Contrast  Result Date: 07/23/2018 CLINICAL DATA:  5-year-old female with head trauma. EXAM: CT HEAD WITHOUT CONTRAST CT MAXILLOFACIAL WITHOUT CONTRAST CT CERVICAL SPINE WITHOUT CONTRAST TECHNIQUE: Multidetector CT imaging of the head, cervical spine, and maxillofacial structures were performed using the standard protocol without intravenous contrast. Multiplanar CT image reconstructions of the cervical spine and maxillofacial structures were also generated. COMPARISON:  Head CT dated 11/16/2017 FINDINGS: CT HEAD FINDINGS Brain: There is a small left parafalcine subdural hemorrhage measuring approximately 3-4 mm in thickness. There is moderate age-related atrophy and chronic microvascular ischemic changes. Small bilateral cerebellar hemisphere old infarcts. No mass effect or midline shift noted. Vascular: No hyperdense vessel or unexpected calcification. Skull: Normal. Negative for fracture or focal lesion. Other: Right forehead and supraorbital contusion/hematoma. CT MAXILLOFACIAL FINDINGS Osseous: No acute fracture. No mandibular subluxation. There is diffuse thickened appearance of the bones of the base of the skull as well as maxilla and mandible which may represent Paget's or related to underlying metabolic disease. Orbits: The globes and retro-orbital fat are preserved. Bilateral cataract surgeries. Sinuses: The visualized paranasal sinuses and mastoid air cells are clear. Soft tissues: Right facial and periorbital hematoma. CT CERVICAL SPINE FINDINGS Alignment: No acute subluxation. There is straightening of normal cervical lordosis which may be positional or due to muscle spasm. Skull base and vertebrae: No acute fracture. The bones are osteopenic. Soft tissues and spinal canal: No prevertebral fluid or swelling. No visible canal hematoma. Disc levels:  Multilevel  degenerative changes. Upper chest: Biapical subpleural thickening/scarring. Other: There is hypoattenuation of the vascular blood pool suggestive of a degree of anemia. Clinical correlation is recommended. IMPRESSION: 1. Small left parafalcine subdural hemorrhage. 2. No acute facial bone fractures. 3. No acute/traumatic cervical spine pathology. These results were called by telephone at the time of interpretation on 07/23/2018 at 3:54 pm to Dr. Sedonia Small, who verbally acknowledged these results. Electronically Signed   By: Anner Crete M.D.   On: 07/23/2018 15:57     Assessment/Plan Principal Problem:   Subdural hematoma (HCC) Active Problems:   Hyperlipidemia   ANEMIA-IRON DEFICIENCY   Essential hypertension   Liposarcoma of lower extremity (HCC)   Peripheral edema   Dementia ([place])     1. Small left parafalcine subdural hemorrhage: Sustained post mechanical fall at home and head injury.  EDP discussed with neurosurgery on-call who advised admission to [facility] for overnight observation, consider repeating CT head after 24 hours to follow-up.  Patient currently alert and oriented without focal neurological deficits and hemodynamically stable.  Hold aspirin. 2. Mechanical fall: Patient reports history of prior falls.  Suspect gait instability.  Ambulates with a walker.  PT and OT evaluation. 3. Essential hypertension: Continue amlodipine.  Hold HCTZ and ARB temporarily for today.  Of note patient seems to be  on 2 ARB's and may consider discontinuing 1 of them at time of discharge. 4. Hyperlipidemia: LDL in March, 91.  Does not appear to be on any medications. 5. Stage III chronic kidney disease: Creatinine of 1.1 is her baseline. 6. Iron deficiency anemia: Hemoglobin stable.  Follow CBC in a.m. 7. Well-differentiated liposarcoma of the left thigh: Seen by oncology in December 2019 and given her advanced age and limited performance status, slow growth, no intervention was  recommended. 8. Dementia without behavioral abnormalities: Delirium precautions while hospitalized.   DVT prophylaxis: SCDs Code Status: Full code.  This was confirmed with patient at bedside and with son and daughter via phone. Family Communication: Discussed in detail with patient's son and daughter via phone, updated care and answered questions. Disposition Plan: Observe overnight and possible DC home tomorrow pending clinical improvement. Consults called: EDP discussed case with neurosurgeon on-call Dr. Zada Finders. Admission status: Observation, medical bed.  Severity of Illness: The appropriate patient status for this patient is OBSERVATION. Observation status is judged to be reasonable and necessary in order to provide the required intensity of service to ensure the patient's safety. The patient's presenting symptoms, physical exam findings, and initial radiographic and laboratory data in the context of their medical condition is felt to place them at decreased risk for further clinical deterioration. Furthermore, it is anticipated that the patient will be medically stable for discharge from the hospital within 2 midnights of admission. The following factors support the patient status of observation.   " The patient's presenting symptoms include mechanical fall and injury to the head. " The physical exam findings include right eye periorbital ecchymosis. " The initial radiographic and laboratory data are CT head shows small subdural hematoma.      Vernell Leep MD [facility]  To contact the attending provider between 7A-7P or the covering provider during after hours 7P-7A, please log into the web site [email or web address] and access using universal [place] password for that web site. If you do not have the password, please call the hospital operator.  07/23/2018, 6:25 PM

## 2018-07-23 NOTE — ED Provider Notes (Signed)
[facility] DEPT Provider Note   CSN: [id number] Arrival date & time: 07/23/18  1327     History   Chief Complaint Chief Complaint  Patient presents with  . Fall  . Head Injury  . lip injury    HPI Lynn Howell is a 85 y.o. female.     HPI Patient is a 85 year old female presents to the emergency department complaints of right-sided facial pain and swelling after a mechanical fall today.  She tripped over the screen door.  She presents with swelling to the right side of her face.  No malocclusion.  Superficial abrasion to the upper lip.  No recent illness.  No fevers or chills.  Denies pain in her bilateral hips, knees, ankles or bilateral shoulders, elbows, wrist.  Denies significant back pain.  No abdominal pain.  No chest pain.  No shortness of breath.  No recent illness.  No use of anticoagulants. Past Medical History:  Diagnosis Date  . ANEMIA-IRON DEFICIENCY 08/01/2007  . ANKLE PAIN, LEFT 01/22/2007  . Arthritis   . BACK PAIN, CHRONIC, INTERMITTENT 02/10/2010  . CERUMEN IMPACTION, RIGHT 04/20/2007  . COLONIC POLYPS, HX OF 09/21/2006  . COMMON MIGRAINE 09/21/2006  . CONSTIPATION, CHRONIC 09/21/2006  . CONTACT DERMATITIS 08/01/2008  . DEGENERATIVE JOINT DISEASE, KNEES, BILATERAL 03/19/2010  . DEPRESSION 08/01/2007  . Hypercholesteremia   . HYPERLIPIDEMIA 08/01/2007  . Hypertension   . HYPERTENSION 09/21/2006  . KNEE PAIN, BILATERAL 09/15/2008  . Left cervical radiculopathy 03/07/2012  . LEG PAIN, RIGHT 03/03/2008  . 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 LUMBAR RADICULOPATHY, RIGHT 03/19/2010  . NEPHROLITHIASIS, HX OF 09/21/2006  . OSTEOPENIA 12/17/2008  . OSTEOPOROSIS 01/22/2007  . SPINAL STENOSIS 12/17/2008  . Stroke ([place])   . Swelling of limb 03/19/2010  . URI 04/20/2007    Patient Active Problem List   Diagnosis Date Noted  . Dementia ([place]) 11/23/2017  . General weakness 11/22/2017  . Incontinence of urine 11/22/2017  . Bilateral knee pain  09/29/2017  . Anemia 09/29/2017  . Gait disorder 09/14/2017  . Low back pain 09/14/2017  . Dizziness 03/08/2017  . Rash 09/07/2016  . Loose stools 03/13/2016  . Left foot pain 02/01/2014  . Bilateral leg pain 09/12/2013  . Peripheral edema 04/23/2012  . Left cervical radiculopathy 03/07/2012  . Preventative health care 07/15/2010  . 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 Osteoarthrosis involving lower leg 03/19/2010  . LUMBAR RADICULOPATHY, RIGHT 03/19/2010  . Backache 02/10/2010  . SPINAL STENOSIS 12/17/2008  . Hyperlipidemia 08/01/2007  . ANEMIA-IRON DEFICIENCY 08/01/2007  . Anxiety state 08/01/2007  . Depression 08/01/2007  . OSTEOPOROSIS 01/22/2007  . COMMON MIGRAINE 09/21/2006  . Essential hypertension 09/21/2006  . CONSTIPATION, CHRONIC 09/21/2006  . COLONIC POLYPS, HX OF 09/21/2006  . NEPHROLITHIASIS, HX OF 09/21/2006    Past Surgical History:  Procedure Laterality Date  . ABDOMINAL HYSTERECTOMY    . CHOLECYSTECTOMY    . TUBAL LIGATION       OB History   No obstetric history on file.      Home Medications    Prior to Admission medications   Medication Sig Start Date End Date Taking? Authorizing Provider  amLODipine (NORVASC) 10 MG tablet TAKE ONE TABLET EACH DAY 05/22/18   Biagio Borg, MD  aspirin 325 MG tablet Take 325 mg by mouth daily.      [provider]  Calcium Carb-Cholecalciferol (CALCIUM + D3 PO) Take 600 mg by mouth.  [provider]  cholestyramine (QUESTRAN) 4 g packet Take 1 packet (4 g total) by mouth 3 (three) times daily as needed. 03/09/16   Biagio Borg, MD  clotrimazole-betamethasone (LOTRISONE) cream Use as directed twice per day as needed to affected areas 03/21/18   Biagio Borg, MD  irbesartan-hydrochlorothiazide (AVALIDE) 150-12.5 MG tablet TAKE ONE TABLET EACH DAY 09/27/17   Biagio Borg, MD  polyethylene glycol powder [facility]) powder 17 GRAMS TWICE DAILY 02/27/17   Biagio Borg, MD   telmisartan (MICARDIS) 20 MG tablet Take 1 tablet (20 mg total) by mouth daily. 03/08/17   Biagio Borg, MD  tiZANidine (ZANAFLEX) 2 MG tablet Take 1 tablet (2 mg total) by mouth every 6 (six) hours as needed for muscle spasms. 09/29/17   Biagio Borg, MD  triamcinolone cream (KENALOG) 0.1 % Apply 1 application topically 2 (two) times daily. 07/25/17   Hoyt Koch, MD    Family History Family History  Problem Relation Age of Onset  . Cancer Mother   . Cancer Sister   . Hypertension Sister   . Diabetes Brother   . Hypertension Brother     Social History Social History   Tobacco Use  . Smoking status: Never Smoker  . Smokeless tobacco: Never Used  Substance Use Topics  . Alcohol use: No  . Drug use: No     Allergies   Alendronate sodium, Hydrochlorothiazide w-triamterene, and Norco [hydrocodone-acetaminophen]   Review of Systems Review of Systems  All other systems reviewed and are negative.    Physical Exam Updated Vital Signs BP 135/67   Pulse 91   Temp 98.4 F (36.9 C) (Oral)   Resp 17   Ht 5\' 8"  (1.727 m)   Wt 56.7 kg   SpO2 100%   BMI 19.01 kg/m   Physical Exam Vitals signs and nursing note reviewed.  Constitutional:      General: She is not in acute distress.    Appearance: She is well-developed.  HENT:     Head: Normocephalic.     Comments: Right periorbital ecchymosis and mild swelling.  Extraocular movements are normal.  No malocclusion.  Dentition normal.  Mild swelling over the right maxillary sinus    Right Ear: Tympanic membrane normal.     Left Ear: Tympanic membrane normal.  Neck:     Musculoskeletal: Normal range of motion and neck supple. Muscular tenderness present. No neck rigidity.  Cardiovascular:     Rate and Rhythm: Normal rate and regular rhythm.     Heart sounds: Normal heart sounds.  Pulmonary:     Effort: Pulmonary effort is normal.     Breath sounds: Normal breath sounds.  Abdominal:     General: There is no  distension.     Palpations: Abdomen is soft.     Tenderness: There is no abdominal tenderness.  Musculoskeletal: Normal range of motion.  Skin:    General: Skin is warm and dry.  Neurological:     Mental Status: She is alert and oriented to person, place, and time.  Psychiatric:        Judgment: Judgment normal.      ED Treatments / Results  Labs (all labs ordered are listed, but only abnormal results are displayed) Labs Reviewed - No data to display  EKG None  Radiology No results found.  Procedures Procedures (including critical care time)  Medications Ordered in ED Medications - No data to display   Initial Impression / Assessment  and Plan / ED Course  I have reviewed the triage vital signs and the nursing notes.  Pertinent labs & imaging results that were available during my care of the patient were reviewed by me and considered in my medical decision making (see chart for details).        Mechanical fall.  Imaging of the head, face, neck pending at this time.  Superficial abrasion to the right upper lip.  No indication for repair at this time.  No active bleeding.  Overall well-appearing.  Full range of motion of major joints.   Care transferred to Dr Sedonia Small at 3:42 PM   Final Clinical Impressions(s) / ED Diagnoses   Final diagnoses:  None    ED Discharge Orders    None       Jola Schmidt, MD 07/23/18 [phone]

## 2018-07-23 NOTE — Progress Notes (Signed)
Patient doesn't have any IV access, NP Baltazar Najjar was paged to ask if she wanted an IV started on the patient. No new orders were given.

## 2018-07-23 NOTE — ED Notes (Signed)
Report given to [place], [address] room [phone]

## 2018-07-23 NOTE — ED Notes (Signed)
Patient transported to CT 

## 2018-07-23 NOTE — ED Provider Notes (Signed)
  Provider Note MRN:  [id number]  Arrival date & time: 07/23/18    ED Course and Medical Decision Making  Assumed care from Dr. Venora Maples at shift change.  Mechanical ground-level fall, question of lip laceration, CTs are pending, anticipating discharge.  4:51 PM update: I was called by radiology and made aware of a subdural hematoma on patient's imaging.  Patient has a normal neurological exam, otherwise had no bony injuries.  Discussed with Dr. Venetia Constable of neurosurgery, patient is okay to stay at [facility] long and be admitted for observation.  Admitted to hospital service for further care.  Labs and coronavirus testing is pending.  Critical Care Documentation Critical care time provided by me (excluding procedures): 32 minutes  Condition necessitating critical care: Subdural hematoma  Components of critical care management: reviewing of prior records, laboratory and imaging interpretation, frequent re-examination and reassessment of vital signs, discussion with consulting services.    Final Clinical Impressions(s) / ED Diagnoses     ICD-10-CM   1. Fall, initial encounter  W19.XXXA   2. Subdural hematoma [facility])  G31.5V7O     ED Discharge Orders    None       Barth Kirks. Sedonia Small, [place] [email or web address]    Maudie Flakes, MD 07/23/18 [phone]

## 2018-07-23 NOTE — ED Triage Notes (Signed)
Patient states the screen door tripped her and she fell on her face on a wooden deck, patient has a hematoma to the right forehead, eye, and right upper lip. Patient states, "I hurt all over."

## 2018-07-24 ENCOUNTER — Observation Stay (HOSPITAL_COMMUNITY): Payer: PPO

## 2018-07-24 ENCOUNTER — Encounter (HOSPITAL_COMMUNITY): Payer: Self-pay | Admitting: Radiology

## 2018-07-24 DIAGNOSIS — M25512 Pain in left shoulder: Secondary | ICD-10-CM | POA: Diagnosis not present

## 2018-07-24 DIAGNOSIS — S4992XA Unspecified injury of left shoulder and upper arm, initial encounter: Secondary | ICD-10-CM | POA: Diagnosis not present

## 2018-07-24 DIAGNOSIS — S065X0A Traumatic subdural hemorrhage without loss of consciousness, initial encounter: Secondary | ICD-10-CM | POA: Diagnosis not present

## 2018-07-24 DIAGNOSIS — S065X9A Traumatic subdural hemorrhage with loss of consciousness of unspecified duration, initial encounter: Secondary | ICD-10-CM | POA: Diagnosis not present

## 2018-07-24 LAB — CBC
HCT: 29.8 % — ABNORMAL LOW (ref 36.0–46.0)
Hemoglobin: 9.3 g/dL — ABNORMAL LOW (ref 12.0–15.0)
MCH: 30.1 pg (ref 26.0–34.0)
MCHC: 31.2 g/dL (ref 30.0–36.0)
MCV: 96.4 fL (ref 80.0–100.0)
Platelets: 202 10*3/uL (ref 150–400)
RBC: 3.09 MIL/uL — ABNORMAL LOW (ref 3.87–5.11)
RDW: 14.6 % (ref 11.5–15.5)
WBC: 5.4 10*3/uL (ref 4.0–10.5)
nRBC: 0 % (ref 0.0–0.2)

## 2018-07-24 NOTE — Progress Notes (Signed)
    Home health agencies that serve [phone].        [place] Quality of Patient Care Rating Patient Survey Summary Rating  [facility] [phone] 3 out of 5 stars 4 out of [place] [phone] 4 out of 5 stars 4 out of [place] [phone]  out of 5 stars 3 out of [place] [phone] 4 out of 5 stars 4 out of [place] [phone] 4 out of 5 stars 4 out of 5 stars  [facility] [place] [phone]  out of 5 stars 4 out of [place] [phone] 3 out of 5 stars 4 out of 5 stars  [facility] [phone] 4 out of 5 stars Not Available12  [facility] [phone] 3  out of 5 stars 3 out of [place] [phone]  out of 5 stars 4 out of [place] [phone] 3  out of 5 stars 3 out of [place] [phone]  out of 5 stars 3 out of [place] number Footnote as displayed on [place]  1 This agency provides services under a federal waiver program to non-traditional, chronic long term population.  2 This agency provides services to a special needs population.  3 Not Available.  4 The number of patient episodes for this measure is too small to report.  5 This measure currently does not have data or provider has been certified/recertified for less than 6 months.  6 The national average for this measure is not provided because of state-to-state differences in data collection.  7 Medicare is not displaying rates for this measure for any home health agency, because of an issue with the data.  8 There were problems with the data and they are being corrected.  9 Zero, or very few, patients met  the survey's rules for inclusion. The scores shown, if any, reflect a very small number of surveys and may not accurately tell how an agency is doing.  10 Survey results are based on less than 12 months of data.  11 Fewer than 70 patients completed the survey. Use the scores shown, if any, with caution as the number of surveys may be too low to accurately tell how an agency is doing.  12 No survey results are available for this period.  13 Data suppressed by CMS for one or more quarters.

## 2018-07-24 NOTE — Evaluation (Addendum)
Occupational Therapy Evaluation Patient Details Name: Lynn Howell MRN: [id number] DOB: 1925/08/09 Today's Date: 07/24/2018    History of Present Illness Pt was admitted after a fall resulting in SDH.  PMH:  dementia, HTN, CKD, liposarcoma L thigh   Clinical Impression   This 85 year old female was admitted for the above.  Pt was very thorough with activities performed and tended to perseverate. Extra time required for all activities.  Recommend [place], unless daughter feels she is at baseline.      Follow Up Recommendations  Supervision/Assistance - 24 hour;Home health OT    Equipment Recommendations  None recommended by OT    Recommendations for Other Services       Precautions / Restrictions Precautions Precautions: Fall Restrictions Weight Bearing Restrictions: No      Mobility Bed Mobility               General bed mobility comments: supervision, extra time  Transfers Overall transfer level: Needs assistance Equipment used: Rolling walker (2 wheeled) Transfers: Sit to/from Stand Sit to Stand: Min guard         General transfer comment: for safety, extra time, cues  to align body with 3:1 , bed and for hand placement    Balance                                           ADL either performed or assessed with clinical judgement   ADL Overall ADL's : Needs assistance/impaired Eating/Feeding: Independent   Grooming: Set up;Supervision/safety;Standing;Wash/dry hands;Wash/dry face;Oral care   Upper Body Bathing: Set up;Supervision/ safety;Standing   Lower Body Bathing: Moderate assistance;Sit to/from stand   Upper Body Dressing : Minimal assistance   Lower Body Dressing: Maximal assistance;Sit to/from stand   Toilet Transfer: Min guard;Ambulation;BSC;RW   Toileting- Clothing Manipulation and Hygiene: Supervision/safety;Sit to/from stand         General ADL Comments: pt steady on feet. Performed adl and grooming standing at  sink.  Pt perseverated on activities, washing face 4xs and she is very thorough with activiities. She did not want to change socks, wash feet     Vision         Perception     Praxis      Pertinent Vitals/Pain Pain Assessment: Faces Faces Pain Scale: Hurts a little bit Pain Location: arm, did not specify which one Pain Descriptors / Indicators: (pt stated arm hurt when getting OOB) Pain Intervention(s): Limited activity within patient's tolerance;Monitored during session;Repositioned     Hand Dominance     Extremity/Trunk Assessment Upper Extremity Assessment Upper Extremity Assessment: Generalized weakness           Communication Communication Communication: No difficulties   Cognition Arousal/Alertness: Awake/alert Behavior During Therapy: WFL for tasks assessed/performed Overall Cognitive Status: No family/caregiver present to determine baseline cognitive functioning                                 General Comments: h/o dementia. Tends to perseverate   General Comments       Exercises     Shoulder Instructions      Home Living Family/patient expects to be discharged to:: Private residence Living Arrangements: Children  Prior Functioning/Environment          Comments: lives with daughter; pt states she did her own ADLs        OT Problem List: Decreased strength;Decreased activity tolerance;Impaired balance (sitting and/or standing);Decreased cognition;Pain      OT Treatment/Interventions:     OT Goals(Current goals can be found in the care plan section)  Patient Stated Goal: none stated OT Goal Formulation: All assessment and education complete, DC therapy  OT Frequency: Min 2X/week   Barriers to D/C:            Co-evaluation              AM-PAC OT "6 Clicks" Daily Activity     Outcome Measure Help from another person eating meals?: None Help from another person  taking care of personal grooming?: A Little Help from another person toileting, which includes using toliet, bedpan, or urinal?: A Little Help from another person bathing (including washing, rinsing, drying)?: A Lot Help from another person to put on and taking off regular upper body clothing?: A Little Help from another person to put on and taking off regular lower body clothing?: A Lot 6 Click Score: 17   End of Session    Activity Tolerance: Patient tolerated treatment well Patient left: (EOB with RN)  OT Visit Diagnosis: Other symptoms and signs involving cognitive function;Muscle weakness (generalized) (M62.81)                Time: 3291-9166 OT Time Calculation (min): 35 min Charges:  OT General Charges $OT Visit: 1 Visit OT Evaluation $OT Eval Low Complexity: 1 Low OT Treatments $Self Care/Home Management : 8-22 mins  Lesle Chris, OTR/L Acute Rehabilitation Services [phone] [facility] pager [phone] office 07/24/2018  [place] 07/24/2018, 10:22 AM

## 2018-07-24 NOTE — Plan of Care (Signed)
Pt was discharged home today. Instructions were reviewed with patient, and questions were answered. Pt was taken to main entrance via wheelchair by NT.  

## 2018-07-24 NOTE — TOC Initial Note (Signed)
Transition of Care [facility]) - Initial/Assessment Note    Patient Details  Name: Lynn Howell MRN: [id number] Date of Birth: May 03, 1925  Transition of Care (TOC) CM/SW Contact:    Joaquin Courts, RN Phone Number: 07/24/2018, 2:52 PM  Clinical Narrative: CM spoke with patient at bedside and son via telephone. Son and patient express that they wish for patient to return home with home health services, do not want SNF at this time due to concerns with covid.  [facility] provide [facility] PT and aide at discharge.                    Expected Discharge Plan: [place] Barriers to Discharge: Continued Medical Work up   Patient Goals and CMS Choice Patient states their goals for this hospitalization and ongoing recovery are:: to go home CMS Medicare.gov Compare Post Acute Care list provided to:: Patient Represenative (must comment)(son) Choice offered to / list presented to : Adult Children  Expected Discharge Plan and Services Expected Discharge Plan: [place]   Discharge Planning Services: CM Consult Post Acute Care Choice: [place] arrangements for the past 2 months: Single Family Home Expected Discharge Date: 07/24/18               DME Arranged: N/A DME Agency: NA       HH Arranged: PT, Nurse's Aide HH Agency: [facility] Date [place] Agency Contacted: 07/24/18 Time [place]: 1829 Representative spoke with at [place]: Glyn Ade  Prior Living Arrangements/Services Living arrangements for the past 2 months: [place] Lives with:: Adult Children Patient language and need for interpreter reviewed:: Yes Do you feel safe going back to the place where you live?: Yes      Need for Family Participation in Patient Care: Yes (Comment) Care giver support system in place?: Yes (comment)   Criminal Activity/Legal Involvement Pertinent to Current Situation/Hospitalization: No - Comment as  needed  Activities of Daily Living Home Assistive Devices/Equipment: Eyeglasses, [place] Rile (specify type) ADL Screening (condition at time of admission) Patient's cognitive ability adequate to safely complete daily activities?: Yes Is the patient deaf or have difficulty hearing?: Yes Does the patient have difficulty seeing, even when wearing glasses/contacts?: Yes Does the patient have difficulty concentrating, remembering, or making decisions?: No Patient able to express need for assistance with ADLs?: Yes Does the patient have difficulty dressing or bathing?: Yes Independently performs ADLs?: No Communication: Independent Dressing (OT): Independent Grooming: Independent Feeding: Independent Bathing: Needs assistance Is this a change from baseline?: Pre-admission baseline Toileting: Independent, Independent with device (comment)(walker) In/Out Bed: Independent with device (comment)(walker) Walks in Home: Independent with device (comment)(walker) Does the patient have difficulty walking or climbing stairs?: Yes Weakness of Legs: Right Weakness of Arms/Hands: Left  Permission Sought/Granted   Permission granted to share information with : Yes, Verbal Permission Granted  Share Information with NAME: [place] Lathe     Permission granted to share info w Relationship: son     Emotional Assessment Appearance:: Appears stated age Attitude/Demeanor/Rapport: Engaged Affect (typically observed): Accepting Orientation: : Oriented to Self, Oriented to Place, Oriented to  Time, Oriented to Situation   Psych Involvement: No (comment)  Admission diagnosis:  Subdural hematoma ([place]) [S06.5X9A] Fall, initial encounter [W19.XXXA] Patient Active Problem List   Diagnosis Date Noted  . Subdural hematoma ([place]) 07/23/2018  . Dementia ([place]) 11/23/2017  . General weakness 11/22/2017  . Incontinence of urine 11/22/2017  .  Bilateral knee pain 09/29/2017  . Gait disorder 09/14/2017  . Low back pain  09/14/2017  . Dizziness 03/08/2017  . Rash 09/07/2016  . Loose stools 03/13/2016  . Left foot pain 02/01/2014  . Bilateral leg pain 09/12/2013  . Peripheral edema 04/23/2012  . Left cervical radiculopathy 03/07/2012  . Preventative health care 07/15/2010  . 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 Osteoarthrosis involving lower leg 03/19/2010  . LUMBAR RADICULOPATHY, RIGHT 03/19/2010  . Backache 02/10/2010  . SPINAL STENOSIS 12/17/2008  . Hyperlipidemia 08/01/2007  . ANEMIA-IRON DEFICIENCY 08/01/2007  . Anxiety state 08/01/2007  . Depression 08/01/2007  . OSTEOPOROSIS 01/22/2007  . COMMON MIGRAINE 09/21/2006  . Essential hypertension 09/21/2006  . CONSTIPATION, CHRONIC 09/21/2006  . COLONIC POLYPS, HX OF 09/21/2006  . NEPHROLITHIASIS, HX OF 09/21/2006   PCP:  Biagio Borg, MD Pharmacy:   [facility], [place] - [address] [place] [address] Phone: [phone] Fax: [phone]     Social Determinants of Health (SDOH) Interventions    Readmission Risk Interventions No flowsheet data found.

## 2018-07-24 NOTE — Evaluation (Signed)
Physical Therapy Evaluation Patient Details Name: Lynn Howell MRN: [id number] DOB: Jan 25, 1925 Today's Date: 07/24/2018   History of Present Illness  Pt is a 85 year old female admitted after a fall resulting in SDH.  PMH:  dementia, HTN, CKD, liposarcoma L thigh  Clinical Impression  Pt admitted with above diagnosis. Pt currently with functional limitations due to the deficits listed below (see PT Problem List).  Pt will benefit from skilled PT to increase their independence and safety with mobility to allow discharge to the venue listed below.  Pt assisted with ambulating in hallway and reports difficulty ambulating in straight line, veers to left side continuously so assisted with negotiating RW.  Pt reports L side feels stronger however reports this is not new (?).  Strength in LEs appeared equal however cognition limited MMT.  RN reports daughter does not live with pt however lives nearby.  Recommend 24/7 assist upon d/c for safety.     Follow Up Recommendations Supervision/Assistance - 24 hour;SNF(only home if 24/7 assist available for safety)    Equipment Recommendations  None recommended by PT    Recommendations for Other Services       Precautions / Restrictions Precautions Precautions: Fall Restrictions Weight Bearing Restrictions: No      Mobility  Bed Mobility Overal bed mobility: Needs Assistance Bed Mobility: Supine to Sit;Sit to Supine     Supine to sit: Min guard Sit to supine: Min assist   General bed mobility comments: increased time required,  Pt unable to lift LEs onto bed without min assist  Transfers Overall transfer level: Needs assistance Equipment used: Rolling walker (2 wheeled) Transfers: Sit to/from Stand Sit to Stand: Min guard         General transfer comment: verbal cues for hand placement  Ambulation/Gait Ambulation/Gait assistance: Min assist Gait Distance (Feet): 200 Feet Assistive device: Rolling walker (2 wheeled) Gait  Pattern/deviations: Step-through pattern;Decreased stride length;Narrow base of support Gait velocity: decr   General Gait Details: pt veering to L side, pt reports "I keep running into the wall, I can't help it." so pt assisted with keeping RW straight forward and maneuvering RW with turns  Stairs            Wheelchair Mobility    Modified Rankin (Stroke Patients Only)       Balance Overall balance assessment: History of Falls;Needs assistance         Standing balance support: Bilateral upper extremity supported Standing balance-Leahy Scale: Poor                               Pertinent Vitals/Pain Pain Assessment: Faces Faces Pain Scale: Hurts a little bit Pain Location: L shoulder Pain Descriptors / Indicators: Sore Pain Intervention(s): Repositioned;Monitored during session;Ice applied    Home Living Family/patient expects to be discharged to:: Private residence Living Arrangements: Alone Available Help at Discharge: Family(daughter lives nearby) Type of Home: House       Home Layout: One level Home Equipment: Environmental consultant - 2 wheels      Prior Function Level of Independence: Independent with assistive device(s)         Comments: pt report she lives with daughter however RN reports daughter lives nearby and frequently over to assist/check on pt     Hand Dominance        Extremity/Trunk Assessment   Upper Extremity Assessment Upper Extremity Assessment: Generalized weakness    Lower Extremity Assessment  Lower Extremity Assessment: Generalized weakness;Difficult to assess due to impaired cognition(not able to follow commands well for MMT)       Communication   Communication: No difficulties  Cognition Arousal/Alertness: Awake/alert Behavior During Therapy: WFL for tasks assessed/performed Overall Cognitive Status: No family/caregiver present to determine baseline cognitive functioning                                  General Comments: h/o dementia      General Comments      Exercises     Assessment/Plan    PT Assessment Patient needs continued PT services  PT Problem List Decreased strength;Decreased mobility;Decreased activity tolerance;Decreased balance;Decreased knowledge of use of DME;Decreased safety awareness       PT Treatment Interventions DME instruction;Therapeutic activities;Gait training;Therapeutic exercise;Patient/family education;Functional mobility training;Balance training    PT Goals (Current goals can be found in the Care Plan section)  Acute Rehab PT Goals Patient Stated Goal: none stated PT Goal Formulation: With patient Time For Goal Achievement: 07/31/18 Potential to Achieve Goals: Good    Frequency Min 3X/week   Barriers to discharge        Co-evaluation               AM-PAC PT "6 Clicks" Mobility  Outcome Measure Help needed turning from your back to your side while in a flat bed without using bedrails?: A Little Help needed moving from lying on your back to sitting on the side of a flat bed without using bedrails?: A Little Help needed moving to and from a bed to a chair (including a wheelchair)?: A Little Help needed standing up from a chair using your arms (e.g., wheelchair or bedside chair)?: A Little Help needed to walk in hospital room?: A Little Help needed climbing 3-5 steps with a railing? : A Lot 6 Click Score: 17    End of Session Equipment Utilized During Treatment: Gait belt Activity Tolerance: Patient tolerated treatment well Patient left: in bed;with call bell/phone within reach;with bed alarm set Nurse Communication: Mobility status PT Visit Diagnosis: Muscle weakness (generalized) (M62.81);Difficulty in walking, not elsewhere classified (R26.2)    Time: 5885-0277 PT Time Calculation (min) (ACUTE ONLY): 24 min   Charges:   PT Evaluation $PT Eval Low Complexity: [place], PT, DPT Acute Rehabilitation  Services Office: [phone] Pager: [phone]  Trena Platt 07/24/2018, 12:32 PM

## 2018-07-24 NOTE — Discharge Summary (Signed)
Physician Discharge Summary  Lasundra Hascall RXV:[id number] DOB: 06/18/1925 DOA: 07/23/2018  PCP: Biagio Borg, MD  Admit date: 07/23/2018 Discharge date: 07/24/2018  Admitted From: home Disposition:  [place]  Recommendations for Outpatient Follow-up:  1. Follow up with PCP in 1-2 weeks 2. Please obtain BMP/CBC in one week 3. Please follow up on the following pending results:  Home Health: YES -PT/AIDE Equipment/Devices: NONE  Discharge Condition: Stable CODE STATUS: FULL Diet recommendation: Heart Healthy  Brief/Interim Summary:    PER hpi pleasant 85 year old female, lives with her daughter, ambulates with the help of a walker, PMH of iron deficiency anemia, arthritis, HLD, HTN, liposarcoma with chronic swelling of left anterior thigh, bilateral leg edema, gait ataxia and prior falls, presented to [facility] ED on 07/23/2018 after an unwitnessed mechanical fall at home with head injury.  Patient provided history which was corroborated with her family.  She was in her usual state of health until this morning when she states that her leg got caught in the screen door and she fell face forward onto her deck.  She denies loss of consciousness.  She reported fair amount of bleeding.  Her daughter heard her fall and alerted her other siblings who are able to assist her and then the son drove her to the ED.  Patient states that she has fallen in the past.  As per family, patient does not like to sit still and likes to keep moving around.  She currently denies any active bleeding,, headache, pain elsewhere, visual abnormalities or strokelike symptoms.  She is asking for something to eat.  She specifically asked for chicken noodle soup.  She denies any other complaints.  ED Course: CT of the head, cervical spine and maxillofacial significant for small left parafalcine subdural hemorrhage without acute facial bone fractures or acute/traumatic cervical spine pathology.  Lab work significant  for hemoglobin of 10.6 and creatinine of 1.19.  EDP discussed with neurosurgery on-call who recommended admission to [facility] in observation overnight, consider repeating CT head tomorrow to follow-up on the subdural hematoma.  She was admitted observation with repeat CT head in 24 hours. Patient is alert awake, stable at baseline denies any pain.  Has left shoulder pain and x-ray was obtained remarkable.Repeat CT head was planned for left subdural hematoma 3 PM and came back as unchanged. Seen by PT OT and suggested 24-hour supervision at home.  Patient currently living alone, BUT helped by daughter at home. Seen by PT OT SNF versus 24 supervision advised patient declined SNF.  She does live with family who are there with her to help.  Case manager has set up home health PT and aide  Issues addressed  Small left parafalcine SDH secondary to fall: Neurologically stable.  Repeat CT head 7/7.  Follow-up outpatient  Fall at home seen by PT OT as above setting up home health pty aide. Patient declined SNF.  Essential hypertension: Pressure controlled on amlodipine.  Advised to hold HCTZ-irbesartan, she was also on 2nd arb- temlisartan- advised to stop. Monitor bp at home and follow-up with PCP for BP management.  Other issues chronic and stable-hyperlipidemia/CKD stage III/IDA/liposarcoma of the left thigh DVT prophylaxis: SCDs Code Status: Full code. Disposition Plan: HOME as CT STABLE Consults called: EDP discussed case with neurosurgeon on-call Dr. Zada Finders.  PLAN DISCUSSED W SON REGARDING AVOIDING ASA, NSAID, AND F/U WITH PCP, NEUROSX AND bp MONITORING AT HOME   Discharge Diagnoses:  Principal Problem:   Subdural hematoma (  [place]) Active Problems:   Hyperlipidemia   ANEMIA-IRON DEFICIENCY   Essential hypertension   Liposarcoma of lower extremity (HCC)   Peripheral edema   Dementia [facility])    Discharge Instructions  Discharge Instructions    Diet - low sodium heart  healthy   Complete by: As directed    Discharge instructions   Complete by: As directed    Please call call MD or return to ER for similar or worsening recurring problem that brought you to hospital or if any fever,nausea/vomiting,abdominal pain, uncontrolled pain, chest pain,  shortness of breath or any other alarming symptoms.  Monitor BP at home and if BP starts to go above 140s- please call your MD for resuming the meds that were held.  Please follow-up your doctor as instructed in a week time and call the office for appointment.  Please follow up with Neurosurgery  Please avoid alcohol, smoking, or any other illicit substance and maintain healthy habits including taking your regular medications as prescribed.  You were cared for by a hospitalist during your hospital stay. If you have any questions about your discharge medications or the care you received while you were in the hospital after you are discharged, you can call the unit and ask to speak with the hospitalist on call if the hospitalist that took care of you is not available.  Once you are discharged, your primary care physician will handle any further medical issues. Please note that NO REFILLS for any discharge medications will be authorized once you are discharged, as it is imperative that you return to your primary care physician (or establish a relationship with a primary care physician if you do not have one) for your aftercare needs so that they can reassess your need for medications and monitor your lab values   Increase activity slowly   Complete by: As directed      Allergies as of 07/24/2018      Reactions   Alendronate Sodium    Hydrochlorothiazide W-triamterene    Norco [hydrocodone-acetaminophen] Nausea Only      Medication List    STOP taking these medications   aspirin 325 MG tablet   irbesartan-hydrochlorothiazide 150-12.5 MG tablet Commonly known as: AVALIDE   telmisartan 20 MG tablet Commonly known  as: Micardis     TAKE these medications   amLODipine 10 MG tablet Commonly known as: NORVASC TAKE ONE TABLET EACH DAY   CALCIUM + D3 PO Take 600 mg by mouth.   cholestyramine 4 g packet Commonly known as: QUESTRAN Take 1 packet (4 g total) by mouth 3 (three) times daily as needed. What changed: reasons to take this   clotrimazole-betamethasone cream Commonly known as: Lotrisone Use as directed twice per day as needed to affected areas   polyethylene glycol powder 17 GM/SCOOP powder Commonly known as: GLYCOLAX/MIRALAX 17 GRAMS TWICE DAILY What changed: See the new instructions.   tiZANidine 2 MG tablet Commonly known as: ZANAFLEX Take 1 tablet (2 mg total) by mouth every 6 (six) hours as needed for muscle spasms.   triamcinolone cream 0.1 % Commonly known as: KENALOG Apply 1 application topically 2 (two) times daily.      Follow-up Information    [facility] Follow [facility].   Specialty: Home Health Services Why: agency will provide home health physical therapy and aide. agency will call you to schedule first visit.  Contact information: [address] [place] [address] [phone]  Judith Part, MD. Call in 1 week(s).   Specialty: Neurosurgery Why: for follow up Contact information: [address] [place] [address] [phone]          Allergies  Allergen Reactions  . Alendronate Sodium   . Hydrochlorothiazide W-Triamterene   . Norco [Hydrocodone-Acetaminophen] Nausea Only    Consultations:  NEUROSX Dr Zada Finders  Procedures/Studies: Ct Head Wo Contrast  Result Date: 07/24/2018 CLINICAL DATA:  Fall EXAM: CT HEAD WITHOUT CONTRAST TECHNIQUE: Contiguous axial images were obtained from the base of the skull through the vertex without intravenous contrast. COMPARISON:  07/23/2018 FINDINGS: Brain: Unchanged to slightly decreased size of left parafalcine subdural hematoma. No associated mass effect. No new site of  hemorrhage. There is periventricular hypoattenuation compatible with chronic microvascular disease. Generalized volume loss pattern is unchanged. 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 Vascular: No abnormal hyperdensity of the major intracranial arteries or dural venous sinuses. No intracranial atherosclerosis. Skull: The visualized skull base, calvarium and extracranial soft tissues are normal. Sinuses/Orbits: No fluid levels or advanced mucosal thickening of the visualized paranasal sinuses. No mastoid or middle ear effusion. The orbits are normal. IMPRESSION: Unchanged small left parafalcine subdural hematoma. Electronically Signed   By: Ulyses Jarred M.D.   On: 07/24/2018 17:51   Ct Head Wo Contrast  Result Date: 07/23/2018 CLINICAL DATA:  28-year-old female with head trauma. EXAM: CT HEAD WITHOUT CONTRAST CT MAXILLOFACIAL WITHOUT CONTRAST CT CERVICAL SPINE WITHOUT CONTRAST TECHNIQUE: Multidetector CT imaging of the head, cervical spine, and maxillofacial structures were performed using the standard protocol without intravenous contrast. Multiplanar CT image reconstructions of the cervical spine and maxillofacial structures were also generated. COMPARISON:  Head CT dated 11/16/2017 FINDINGS: CT HEAD FINDINGS Brain: There is a small left parafalcine subdural hemorrhage measuring approximately 3-4 mm in thickness. There is moderate age-related atrophy and chronic microvascular ischemic changes. Small bilateral cerebellar hemisphere old infarcts. No mass effect or midline shift noted. Vascular: No hyperdense vessel or unexpected calcification. Skull: Normal. Negative for fracture or focal lesion. Other: Right forehead and supraorbital contusion/hematoma. CT MAXILLOFACIAL FINDINGS Osseous: No acute fracture. No mandibular subluxation. There is diffuse thickened appearance of the bones of the base of the skull as well as maxilla and mandible which may represent Paget's or related to underlying  metabolic disease. Orbits: The globes and retro-orbital fat are preserved. Bilateral cataract surgeries. Sinuses: The visualized paranasal sinuses and mastoid air cells are clear. Soft tissues: Right facial and periorbital hematoma. CT CERVICAL SPINE FINDINGS Alignment: No acute subluxation. There is straightening of normal cervical lordosis which may be positional or due to muscle spasm. Skull base and vertebrae: No acute fracture. The bones are osteopenic. Soft tissues and spinal canal: No prevertebral fluid or swelling. No visible canal hematoma. Disc levels:  Multilevel degenerative changes. Upper chest: Biapical subpleural thickening/scarring. Other: There is hypoattenuation of the vascular blood pool suggestive of a degree of anemia. Clinical correlation is recommended. IMPRESSION: 1. Small left parafalcine subdural hemorrhage. 2. No acute facial bone fractures. 3. No acute/traumatic cervical spine pathology. These results were called by telephone at the time of interpretation on 07/23/2018 at 3:54 pm to Dr. Sedonia Small, who verbally acknowledged these results. Electronically Signed   By: Anner Crete M.D.   On: 07/23/2018 15:57   Ct Cervical Spine Wo Contrast  Result Date: 07/23/2018 CLINICAL DATA:  89-year-old female with head trauma. EXAM: CT HEAD WITHOUT CONTRAST CT MAXILLOFACIAL WITHOUT CONTRAST CT CERVICAL SPINE WITHOUT CONTRAST TECHNIQUE: Multidetector CT imaging of  the head, cervical spine, and maxillofacial structures were performed using the standard protocol without intravenous contrast. Multiplanar CT image reconstructions of the cervical spine and maxillofacial structures were also generated. COMPARISON:  Head CT dated 11/16/2017 FINDINGS: CT HEAD FINDINGS Brain: There is a small left parafalcine subdural hemorrhage measuring approximately 3-4 mm in thickness. There is moderate age-related atrophy and chronic microvascular ischemic changes. Small bilateral cerebellar hemisphere old infarcts. No mass  effect or midline shift noted. Vascular: No hyperdense vessel or unexpected calcification. Skull: Normal. Negative for fracture or focal lesion. Other: Right forehead and supraorbital contusion/hematoma. CT MAXILLOFACIAL FINDINGS Osseous: No acute fracture. No mandibular subluxation. There is diffuse thickened appearance of the bones of the base of the skull as well as maxilla and mandible which may represent Paget's or related to underlying metabolic disease. Orbits: The globes and retro-orbital fat are preserved. Bilateral cataract surgeries. Sinuses: The visualized paranasal sinuses and mastoid air cells are clear. Soft tissues: Right facial and periorbital hematoma. CT CERVICAL SPINE FINDINGS Alignment: No acute subluxation. There is straightening of normal cervical lordosis which may be positional or due to muscle spasm. Skull base and vertebrae: No acute fracture. The bones are osteopenic. Soft tissues and spinal canal: No prevertebral fluid or swelling. No visible canal hematoma. Disc levels:  Multilevel degenerative changes. Upper chest: Biapical subpleural thickening/scarring. Other: There is hypoattenuation of the vascular blood pool suggestive of a degree of anemia. Clinical correlation is recommended. IMPRESSION: 1. Small left parafalcine subdural hemorrhage. 2. No acute facial bone fractures. 3. No acute/traumatic cervical spine pathology. These results were called by telephone at the time of interpretation on 07/23/2018 at 3:54 pm to Dr. Sedonia Small, who verbally acknowledged these results. Electronically Signed   By: Anner Crete M.D.   On: 07/23/2018 15:57   Dg Shoulder Left  Result Date: 07/24/2018 CLINICAL DATA:  Left shoulder pain after falling. EXAM: LEFT SHOULDER - 2+ VIEW COMPARISON:  None. FINDINGS: There is no evidence of fracture or dislocation. There is no evidence of arthropathy or other focal bone abnormality. Soft tissues are unremarkable. IMPRESSION: Negative. Electronically Signed   By:  Nelson Chimes M.D.   On: 07/24/2018 11:59   Ct Maxillofacial Wo Contrast  Result Date: 07/23/2018 CLINICAL DATA:  30-year-old female with head trauma. EXAM: CT HEAD WITHOUT CONTRAST CT MAXILLOFACIAL WITHOUT CONTRAST CT CERVICAL SPINE WITHOUT CONTRAST TECHNIQUE: Multidetector CT imaging of the head, cervical spine, and maxillofacial structures were performed using the standard protocol without intravenous contrast. Multiplanar CT image reconstructions of the cervical spine and maxillofacial structures were also generated. COMPARISON:  Head CT dated 11/16/2017 FINDINGS: CT HEAD FINDINGS Brain: There is a small left parafalcine subdural hemorrhage measuring approximately 3-4 mm in thickness. There is moderate age-related atrophy and chronic microvascular ischemic changes. Small bilateral cerebellar hemisphere old infarcts. No mass effect or midline shift noted. Vascular: No hyperdense vessel or unexpected calcification. Skull: Normal. Negative for fracture or focal lesion. Other: Right forehead and supraorbital contusion/hematoma. CT MAXILLOFACIAL FINDINGS Osseous: No acute fracture. No mandibular subluxation. There is diffuse thickened appearance of the bones of the base of the skull as well as maxilla and mandible which may represent Paget's or related to underlying metabolic disease. Orbits: The globes and retro-orbital fat are preserved. Bilateral cataract surgeries. Sinuses: The visualized paranasal sinuses and mastoid air cells are clear. Soft tissues: Right facial and periorbital hematoma. CT CERVICAL SPINE FINDINGS Alignment: No acute subluxation. There is straightening of normal cervical lordosis which may be positional or due to muscle  spasm. Skull base and vertebrae: No acute fracture. The bones are osteopenic. Soft tissues and spinal canal: No prevertebral fluid or swelling. No visible canal hematoma. Disc levels:  Multilevel degenerative changes. Upper chest: Biapical subpleural thickening/scarring.  Other: There is hypoattenuation of the vascular blood pool suggestive of a degree of anemia. Clinical correlation is recommended. IMPRESSION: 1. Small left parafalcine subdural hemorrhage. 2. No acute facial bone fractures. 3. No acute/traumatic cervical spine pathology. These results were called by telephone at the time of interpretation on 07/23/2018 at 3:54 pm to Dr. Sedonia Small, who verbally acknowledged these results. Electronically Signed   By: Anner Crete M.D.   On: 07/23/2018 15:57   Subjective: No headache, double vision, speech or swallowing problems. Resting well Discharge Exam: Vitals:   07/24/18 0536 07/24/18 1300  BP: 131/70 135/69  Pulse: 74 90  Resp: 16 16  Temp: 97.9 F (36.6 C) 98.1 F (36.7 C)  SpO2: 100% 100%   Vitals:   07/23/18 1927 07/23/18 2159 07/24/18 0536 07/24/18 1300  BP: 139/74 135/64 131/70 135/69  Pulse: 91 87 74 90  Resp:  16 16 16   Temp:  98.3 F (36.8 C) 97.9 F (36.6 C) 98.1 F (36.7 C)  TempSrc:  Oral Oral Oral  SpO2:  100% 100% 100%  Weight:      Height:        General: Pt is alert, awake, not in acute distress. Bruise+ on forehead. Cardiovascular: RRR, S1/S2 +, no rubs, no gallops Respiratory: CTA bilaterally, no wheezing, no rhonchi Abdominal: Soft, NT, ND, bowel sounds + Extremities: no edema, no cyanosis   The results of significant diagnostics from this hospitalization (including imaging, microbiology, ancillary and laboratory) are listed below for reference.     Microbiology: Recent Results (from the past 240 hour(s))  SARS Coronavirus 2 (CEPHEID - Performed in [facility] hospital lab), Hosp Order     Status: None   Collection Time: 07/23/18  4:22 PM   Specimen: Nasopharyngeal Swab  Result Value Ref Range Status   SARS Coronavirus 2 NEGATIVE NEGATIVE Final    Comment: (NOTE) If result is NEGATIVE SARS-CoV-2 target nucleic acids are NOT DETECTED. The SARS-CoV-2 RNA is generally detectable in upper and lower  respiratory  specimens during the acute phase of infection. The lowest  concentration of SARS-CoV-2 viral copies this assay can detect is 250  copies / mL. A negative result does not preclude SARS-CoV-2 infection  and should not be used as the sole basis for treatment or other  patient management decisions.  A negative result may occur with  improper specimen collection / handling, submission of specimen other  than nasopharyngeal swab, presence of viral mutation(s) within the  areas targeted by this assay, and inadequate number of viral copies  (<250 copies / mL). A negative result must be combined with clinical  observations, patient history, and epidemiological information. If result is POSITIVE SARS-CoV-2 target nucleic acids are DETECTED. The SARS-CoV-2 RNA is generally detectable in upper and lower  respiratory specimens dur ing the acute phase of infection.  Positive  results are indicative of active infection with SARS-CoV-2.  Clinical  correlation with patient history and other diagnostic information is  necessary to determine patient infection status.  Positive results do  not rule out bacterial infection or co-infection with other viruses. If result is PRESUMPTIVE POSTIVE SARS-CoV-2 nucleic acids MAY BE PRESENT.   A presumptive positive result was obtained on the submitted specimen  and confirmed on repeat testing.  While 2019  novel coronavirus  (SARS-CoV-2) nucleic acids may be present in the submitted sample  additional confirmatory testing may be necessary for epidemiological  and / or clinical management purposes  to differentiate between  SARS-CoV-2 and other Sarbecovirus currently known to infect humans.  If clinically indicated additional testing with an alternate test  methodology [phone]) is advised. The SARS-CoV-2 RNA is generally  detectable in upper and lower respiratory sp ecimens during the acute  phase of infection. The expected result is Negative. Fact Sheet for  Patients:  StrictlyIdeas.no Fact Sheet for Healthcare Providers: BankingDealers.co.za This test is not yet approved or cleared by the [place] FDA and has been authorized for detection and/or diagnosis of SARS-CoV-2 by FDA under an Emergency Use Authorization (EUA).  This EUA will remain in effect (meaning this test can be used) for the duration of the COVID-19 declaration under Section 564(b)(1) of the Act, 21 U.S.C. section 360bbb-3(b)(1), unless the authorization is terminated or revoked sooner. Performed at [facility], [place] [address] [place], [place] [address]      Labs: BNP (last 3 results) No results for input(s): BNP in the last 8760 hours. Basic Metabolic Panel: Recent Labs  Lab 07/23/18 1558  NA 142  K 3.5  CL 107  CO2 26  GLUCOSE 99  BUN 20  CREATININE 1.19*  CALCIUM 9.7   Liver Function Tests: No results for input(s): AST, ALT, ALKPHOS, BILITOT, PROT, ALBUMIN in the last 168 hours. No results for input(s): LIPASE, AMYLASE in the last 168 hours. No results for input(s): AMMONIA in the last 168 hours. CBC: Recent Labs  Lab 07/23/18 1558 07/24/18 0336  WBC 6.9 5.4  HGB 10.6* 9.3*  HCT 33.2* 29.8*  MCV 95.1 96.4  PLT 236 202   Cardiac Enzymes: No results for input(s): CKTOTAL, CKMB, CKMBINDEX, TROPONINI in the last 168 hours. BNP: Invalid input(s): POCBNP CBG: No results for input(s): GLUCAP in the last 168 hours. D-Dimer No results for input(s): DDIMER in the last 72 hours. Hgb A1c No results for input(s): HGBA1C in the last 72 hours. Lipid Profile No results for input(s): CHOL, HDL, LDLCALC, TRIG, CHOLHDL, LDLDIRECT in the last 72 hours. Thyroid function studies No results for input(s): TSH, T4TOTAL, T3FREE, THYROIDAB in the last 72 hours.  Invalid input(s): FREET3 Anemia work up No results for input(s): VITAMINB12, FOLATE, FERRITIN, TIBC, IRON, RETICCTPCT in the last 72  hours. Urinalysis    Component Value Date/Time   COLORURINE YELLOW 03/22/2018 1559   APPEARANCEUR CLEAR 03/22/2018 1559   LABSPEC 1.020 03/22/2018 1559   PHURINE 7.0 03/22/2018 1559   GLUCOSEU NEGATIVE 03/22/2018 1559   HGBUR NEGATIVE 03/22/2018 1559   BILIRUBINUR NEGATIVE 03/22/2018 1559   KETONESUR NEGATIVE 03/22/2018 1559   PROTEINUR NEGATIVE 11/16/2017 1715   UROBILINOGEN 0.2 03/22/2018 1559   NITRITE NEGATIVE 03/22/2018 1559   LEUKOCYTESUR NEGATIVE 03/22/2018 1559   Sepsis Labs Invalid input(s): PROCALCITONIN,  WBC,  LACTICIDVEN Microbiology Recent Results (from the past 240 hour(s))  SARS Coronavirus 2 (CEPHEID - Performed in [facility] hospital lab), Hosp Order     Status: None   Collection Time: 07/23/18  4:22 PM   Specimen: Nasopharyngeal Swab  Result Value Ref Range Status   SARS Coronavirus 2 NEGATIVE NEGATIVE Final    Comment: (NOTE) If result is NEGATIVE SARS-CoV-2 target nucleic acids are NOT DETECTED. The SARS-CoV-2 RNA is generally detectable in upper and lower  respiratory specimens during the acute phase of infection. The lowest  concentration of SARS-CoV-2 viral  copies this assay can detect is 250  copies / mL. A negative result does not preclude SARS-CoV-2 infection  and should not be used as the sole basis for treatment or other  patient management decisions.  A negative result may occur with  improper specimen collection / handling, submission of specimen other  than nasopharyngeal swab, presence of viral mutation(s) within the  areas targeted by this assay, and inadequate number of viral copies  (<250 copies / mL). A negative result must be combined with clinical  observations, patient history, and epidemiological information. If result is POSITIVE SARS-CoV-2 target nucleic acids are DETECTED. The SARS-CoV-2 RNA is generally detectable in upper and lower  respiratory specimens dur ing the acute phase of infection.  Positive  results are indicative  of active infection with SARS-CoV-2.  Clinical  correlation with patient history and other diagnostic information is  necessary to determine patient infection status.  Positive results do  not rule out bacterial infection or co-infection with other viruses. If result is PRESUMPTIVE POSTIVE SARS-CoV-2 nucleic acids MAY BE PRESENT.   A presumptive positive result was obtained on the submitted specimen  and confirmed on repeat testing.  While 2019 novel coronavirus  (SARS-CoV-2) nucleic acids may be present in the submitted sample  additional confirmatory testing may be necessary for epidemiological  and / or clinical management purposes  to differentiate between  SARS-CoV-2 and other Sarbecovirus currently known to infect humans.  If clinically indicated additional testing with an alternate test  methodology [phone]) is advised. The SARS-CoV-2 RNA is generally  detectable in upper and lower respiratory sp ecimens during the acute  phase of infection. The expected result is Negative. Fact Sheet for Patients:  StrictlyIdeas.no Fact Sheet for Healthcare Providers: BankingDealers.co.za This test is not yet approved or cleared by the [place] FDA and has been authorized for detection and/or diagnosis of SARS-CoV-2 by FDA under an Emergency Use Authorization (EUA).  This EUA will remain in effect (meaning this test can be used) for the duration of the COVID-19 declaration under Section 564(b)(1) of the Act, 21 U.S.C. section 360bbb-3(b)(1), unless the authorization is terminated or revoked sooner. Performed at [facility], [place] [address] [place], [place] [address]      Time coordinating discharge: 25 minutes  SIGNED:   Antonieta Pert, MD  [facility] 07/24/2018, 6:03 PM  If 7PM-7AM, please contact night-coverage [email or web address]

## 2018-07-25 ENCOUNTER — Telehealth: Payer: Self-pay | Admitting: *Deleted

## 2018-07-25 NOTE — Telephone Encounter (Signed)
Transition Care Management Follow-up Telephone Call   Date discharged? 07/24/18   How have you been since you were released from the hospital? Spoke w/son Eden Lathe) he states mom is doing alright   Do you understand why you were in the hospital? YES   Do you understand the discharge instructions? YES   Where were you discharged to? Home   Items Reviewed:  Medications reviewed: YES, son states she is no longer taking Telmisartan, nor Irbesartan  Allergies reviewed: YES  Dietary changes reviewed: YES  Referrals reviewed: YES, he states no one has called about the referral for HH, and PT. Inform referral was placed while she was in hosp and they should be receiving call in next couple days   Functional Questionnaire:   Activities of Daily Living (ADLs):   He states she are independent in the following: bathing and hygiene, feeding, continence, grooming and toileting States they require assistance with the following: ambulation and dressing   Any transportation issues/concerns?: NO   Any patient concerns? NO   Confirmed importance and date/time of follow-up visits scheduled YES, (virtual) 08/03/18  Provider Appointment booked with Dr. Jenny Reichmann  Confirmed with patient if condition begins to worsen call PCP or go to the ER.  Patient was given the office number and encouraged to call back with question or concerns.  : YES

## 2018-07-28 DIAGNOSIS — G609 Hereditary and idiopathic neuropathy, unspecified: Secondary | ICD-10-CM | POA: Diagnosis not present

## 2018-07-28 DIAGNOSIS — N183 Chronic kidney disease, stage 3 (moderate): Secondary | ICD-10-CM | POA: Diagnosis not present

## 2018-07-28 DIAGNOSIS — G43009 Migraine without aura, not intractable, without status migrainosus: Secondary | ICD-10-CM | POA: Diagnosis not present

## 2018-07-28 DIAGNOSIS — Z86010 Personal history of colonic polyps: Secondary | ICD-10-CM | POA: Diagnosis not present

## 2018-07-28 DIAGNOSIS — R32 Unspecified urinary incontinence: Secondary | ICD-10-CM | POA: Diagnosis not present

## 2018-07-28 DIAGNOSIS — F0390 Unspecified dementia without behavioral disturbance: Secondary | ICD-10-CM | POA: Diagnosis not present

## 2018-07-28 DIAGNOSIS — M5416 Radiculopathy, lumbar region: Secondary | ICD-10-CM | POA: Diagnosis not present

## 2018-07-28 DIAGNOSIS — I129 Hypertensive chronic kidney disease with stage 1 through stage 4 chronic kidney disease, or unspecified chronic kidney disease: Secondary | ICD-10-CM | POA: Diagnosis not present

## 2018-07-28 DIAGNOSIS — S0011XD Contusion of right eyelid and periocular area, subsequent encounter: Secondary | ICD-10-CM | POA: Diagnosis not present

## 2018-07-28 DIAGNOSIS — M170 Bilateral primary osteoarthritis of knee: Secondary | ICD-10-CM | POA: Diagnosis not present

## 2018-07-28 DIAGNOSIS — Z9181 History of falling: Secondary | ICD-10-CM | POA: Diagnosis not present

## 2018-07-28 DIAGNOSIS — K5909 Other constipation: Secondary | ICD-10-CM | POA: Diagnosis not present

## 2018-07-28 DIAGNOSIS — I872 Venous insufficiency (chronic) (peripheral): Secondary | ICD-10-CM | POA: Diagnosis not present

## 2018-07-28 DIAGNOSIS — M4800 Spinal stenosis, site unspecified: Secondary | ICD-10-CM | POA: Diagnosis not present

## 2018-07-28 DIAGNOSIS — D509 Iron deficiency anemia, unspecified: Secondary | ICD-10-CM | POA: Diagnosis not present

## 2018-07-28 DIAGNOSIS — F329 Major depressive disorder, single episode, unspecified: Secondary | ICD-10-CM | POA: Diagnosis not present

## 2018-07-28 DIAGNOSIS — M5412 Radiculopathy, cervical region: Secondary | ICD-10-CM | POA: Diagnosis not present

## 2018-07-28 DIAGNOSIS — F411 Generalized anxiety disorder: Secondary | ICD-10-CM | POA: Diagnosis not present

## 2018-07-28 DIAGNOSIS — C4922 Malignant neoplasm of connective and soft tissue of left lower limb, including hip: Secondary | ICD-10-CM | POA: Diagnosis not present

## 2018-07-28 DIAGNOSIS — Z87442 Personal history of urinary calculi: Secondary | ICD-10-CM | POA: Diagnosis not present

## 2018-07-28 DIAGNOSIS — Z7982 Long term (current) use of aspirin: Secondary | ICD-10-CM | POA: Diagnosis not present

## 2018-07-28 DIAGNOSIS — E785 Hyperlipidemia, unspecified: Secondary | ICD-10-CM | POA: Diagnosis not present

## 2018-07-28 DIAGNOSIS — M25519 Pain in unspecified shoulder: Secondary | ICD-10-CM | POA: Diagnosis not present

## 2018-07-28 DIAGNOSIS — S065X0D Traumatic subdural hemorrhage without loss of consciousness, subsequent encounter: Secondary | ICD-10-CM | POA: Diagnosis not present

## 2018-07-28 DIAGNOSIS — M810 Age-related osteoporosis without current pathological fracture: Secondary | ICD-10-CM | POA: Diagnosis not present

## 2018-07-31 ENCOUNTER — Telehealth: Payer: Self-pay | Admitting: Internal Medicine

## 2018-07-31 NOTE — Telephone Encounter (Signed)
Home Health Verbal Orders - Caller/Agency: [facility] Number: [phone]  Requesting OT/PT/Skilled Nursing/Social Work/Speech Therapy:PT Frequency: 2x 3wks 1x 3wks  Pt fell and need balance work up

## 2018-08-01 NOTE — Telephone Encounter (Signed)
Verbal orders given via VM 

## 2018-08-02 ENCOUNTER — Telehealth: Payer: Self-pay | Admitting: Internal Medicine

## 2018-08-02 NOTE — Telephone Encounter (Signed)
Ok for verbals 

## 2018-08-02 NOTE — Telephone Encounter (Signed)
Home Health Verbal Orders - Caller/Agency:  [facility] Number: [phone] Requesting OT/PT/Skilled Nursing/Social Work/Speech Therapy: speech therapy  Frequency: once a week for 4 weeks.

## 2018-08-03 ENCOUNTER — Ambulatory Visit (INDEPENDENT_AMBULATORY_CARE_PROVIDER_SITE_OTHER): Payer: PPO | Admitting: Internal Medicine

## 2018-08-03 DIAGNOSIS — I10 Essential (primary) hypertension: Secondary | ICD-10-CM | POA: Diagnosis not present

## 2018-08-03 DIAGNOSIS — S065X9A Traumatic subdural hemorrhage with loss of consciousness of unspecified duration, initial encounter: Secondary | ICD-10-CM

## 2018-08-03 DIAGNOSIS — S065XAA Traumatic subdural hemorrhage with loss of consciousness status unknown, initial encounter: Secondary | ICD-10-CM

## 2018-08-03 DIAGNOSIS — R531 Weakness: Secondary | ICD-10-CM

## 2018-08-03 MED ORDER — AMLODIPINE BESYLATE 5 MG PO TABS: 5.0000 mg | ORAL_TABLET | Freq: Every day | ORAL | 3 refills | Status: DC

## 2018-08-03 NOTE — Telephone Encounter (Signed)
Called [facility] there was no answer LMOM w/MD response.Marland KitchenJohny Chess

## 2018-08-03 NOTE — Progress Notes (Signed)
Patient ID: Lynn Howell, female   DOB: Aug 03, 1925, 85 y.o.   MRN: [id number]  Virtual Visit via Video Note  I connected with Lynn Howell on 08/03/18 at  1:00 PM EDT by a video enabled telemedicine application and verified that I am speaking with the correct person using two identifiers.  Location: Patient: at home with family present Provider: at office   I discussed the limitations of evaluation and management by telemedicine and the availability of in person appointments. The patient expressed understanding and agreed to proceed.  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  She was admitted observation with repeat CT head in 24 hours. Repeat CT head was planned for left subdural hematoma 3 PM and came back as unchanged.  Seen by PT OT and suggested 24-hour supervision at home.  Patient currently living alone, BUT helped by daughter at home. Seen by PT OT SNF versus 24 supervision advised patient declined SNF.  Pt now with [facility] with PT and aide.  Today, pt states essentially no worse for the wear, denies HA, or other worsening MSk complaint. Pt denies new neurological symptoms such as new headache, or facial or extremity weakness or numbness  Pt denies fever, wt loss, night sweats, loss of appetite, or other constitutional symptoms  Denies  urinary symptoms such as dysuria, frequency, urgency, flank pain, hematuria or n/v, fever, chills.  Denies worsening reflux, abd pain, dysphagia, n/v, bowel change or blood.  Pt denies chest pain, increased sob or doe, wheezing, orthopnea, PND, increased LE swelling, palpitations, syncope but has had intermittent lightheadedness, and BP at home are check several times per day, all about 104/49 - 112/56.  Has f/u appt with [facility] next wk Dr Vertell Limber.  Past Medical History:  Diagnosis Date  . ANEMIA-IRON DEFICIENCY 08/01/2007  . ANKLE PAIN, LEFT 01/22/2007  . Arthritis   . BACK PAIN, CHRONIC, INTERMITTENT 02/10/2010  . CERUMEN IMPACTION, RIGHT 04/20/2007  . COLONIC POLYPS, HX OF 09/21/2006  . COMMON MIGRAINE 09/21/2006  . CONSTIPATION, CHRONIC 09/21/2006  . CONTACT DERMATITIS 08/01/2008  . DEGENERATIVE JOINT DISEASE, KNEES, BILATERAL 03/19/2010  . DEPRESSION 08/01/2007  . Hypercholesteremia   . HYPERLIPIDEMIA 08/01/2007  . Hypertension   . HYPERTENSION 09/21/2006  . KNEE PAIN, BILATERAL 09/15/2008  . Left cervical radiculopathy 03/07/2012  . LEG PAIN, RIGHT 03/03/2008  . Liposarcoma of lower extremity ([place]) 04/14/2010  . LUMBAR RADICULOPATHY, RIGHT 03/19/2010  . NEPHROLITHIASIS, HX OF 09/21/2006  . OSTEOPENIA 12/17/2008  . OSTEOPOROSIS 01/22/2007  . SPINAL STENOSIS 12/17/2008  . Stroke ([place])   . Swelling of limb 03/19/2010  . URI 04/20/2007   Past Surgical History:  Procedure Laterality Date  . ABDOMINAL HYSTERECTOMY    . CHOLECYSTECTOMY    . TUBAL LIGATION      reports that she has never smoked. She has never used smokeless tobacco. She reports that she does not  drink alcohol or use drugs. family history includes Cancer in her mother and sister; Diabetes in her brother; Hypertension in her brother and sister. Allergies  Allergen Reactions  . Alendronate Sodium   . Hydrochlorothiazide W-Triamterene   . Norco [Hydrocodone-Acetaminophen] Nausea Only   Current Outpatient Medications on File Prior to Visit  Medication  Sig Dispense Refill  . Calcium Carb-Cholecalciferol (CALCIUM + D3 PO) Take 600 mg by mouth.    . cholestyramine (QUESTRAN) 4 g packet Take 1 packet (4 g total) by mouth 3 (three) times daily as needed. (Patient taking differently: Take 4 g by mouth 3 (three) times daily as needed (cholesterol). ) 60 each 5  . clotrimazole-betamethasone (LOTRISONE) cream Use as directed twice per day as needed to affected areas 15 g 1  . polyethylene glycol powder (GLYCOLAX/MIRALAX) powder 17 GRAMS TWICE DAILY (Patient taking differently: Take 0.5 Containers by mouth 2 (two) times a day. ) 527 g 2  . tiZANidine (ZANAFLEX) 2 MG tablet Take 1 tablet (2 mg total) by mouth every 6 (six) hours as needed for muscle spasms. 30 tablet 3  . triamcinolone cream (KENALOG) 0.1 % Apply 1 application topically 2 (two) times daily. 30 g 0   No current facility-administered medications on file prior to visit.    Observations/Objective: Alert, NAD, appropriate mood and affect, resps normal, cn 2-12 intact, moves all 4s, no visible rash or swelling Lab Results  Component Value Date   WBC 5.4 07/24/2018   HGB 9.3 (L) 07/24/2018   HCT 29.8 (L) 07/24/2018   PLT 202 07/24/2018   GLUCOSE 99 07/23/2018   CHOL 194 03/21/2018   TRIG 51.0 03/21/2018   HDL 92.30 03/21/2018   LDLDIRECT 134.5 11/01/2012   LDLCALC 91 03/21/2018   ALT 16 03/21/2018   AST 23 03/21/2018   NA 142 07/23/2018   K 3.5 07/23/2018   CL 107 07/23/2018   CREATININE 1.19 (H) 07/23/2018   BUN 20 07/23/2018   CO2 26 07/23/2018   TSH 3.65 03/21/2018   Assessment and Plan: See notes  Follow Up Instructions: See notes   I discussed the assessment and treatment plan with the patient. The patient was provided an opportunity to ask questions and all were answered. The patient agreed with the plan and demonstrated an understanding of the instructions.   The patient was advised to call back or seek an in-person evaluation if the symptoms worsen or if the  condition fails to improve as anticipated.  Cathlean Cower, MD

## 2018-08-03 NOTE — Patient Instructions (Signed)
Ok to decrease the amlodipine to 5 mg per day  OK to stay off the aspirin for now  Please continue all other medications as before, and refills have been done if requested.  Please have the pharmacy call with any other refills you may need.  Please keep your appointments with your specialists as you may have planned

## 2018-08-04 ENCOUNTER — Encounter: Payer: Self-pay | Admitting: Internal Medicine

## 2018-08-04 NOTE — Assessment & Plan Note (Signed)
Appears overcontrolled - for decreased amlodipine to 5 mg daily, cont monitor BP at home, d/c if BP remains < 120/90 or worsening symptoms

## 2018-08-04 NOTE — Assessment & Plan Note (Signed)
Stable hx, to cont stay off asa for now, f/u NS as planned

## 2018-08-04 NOTE — Assessment & Plan Note (Signed)
To continue Home PT

## 2018-08-09 DIAGNOSIS — S065X9A Traumatic subdural hemorrhage with loss of consciousness of unspecified duration, initial encounter: Secondary | ICD-10-CM | POA: Diagnosis not present

## 2018-08-20 ENCOUNTER — Other Ambulatory Visit: Payer: Self-pay

## 2018-08-20 DIAGNOSIS — S065X0D Traumatic subdural hemorrhage without loss of consciousness, subsequent encounter: Secondary | ICD-10-CM | POA: Diagnosis not present

## 2018-08-20 DIAGNOSIS — I872 Venous insufficiency (chronic) (peripheral): Secondary | ICD-10-CM | POA: Diagnosis not present

## 2018-08-20 DIAGNOSIS — C4922 Malignant neoplasm of connective and soft tissue of left lower limb, including hip: Secondary | ICD-10-CM | POA: Diagnosis not present

## 2018-08-20 DIAGNOSIS — S0011XD Contusion of right eyelid and periocular area, subsequent encounter: Secondary | ICD-10-CM | POA: Diagnosis not present

## 2018-08-20 DIAGNOSIS — Z86010 Personal history of colonic polyps: Secondary | ICD-10-CM | POA: Diagnosis not present

## 2018-08-20 DIAGNOSIS — M810 Age-related osteoporosis without current pathological fracture: Secondary | ICD-10-CM | POA: Diagnosis not present

## 2018-08-20 DIAGNOSIS — Z87442 Personal history of urinary calculi: Secondary | ICD-10-CM | POA: Diagnosis not present

## 2018-08-20 DIAGNOSIS — N183 Chronic kidney disease, stage 3 (moderate): Secondary | ICD-10-CM | POA: Diagnosis not present

## 2018-08-20 DIAGNOSIS — G609 Hereditary and idiopathic neuropathy, unspecified: Secondary | ICD-10-CM | POA: Diagnosis not present

## 2018-08-20 DIAGNOSIS — F329 Major depressive disorder, single episode, unspecified: Secondary | ICD-10-CM | POA: Diagnosis not present

## 2018-08-20 DIAGNOSIS — M25519 Pain in unspecified shoulder: Secondary | ICD-10-CM | POA: Diagnosis not present

## 2018-08-20 DIAGNOSIS — D509 Iron deficiency anemia, unspecified: Secondary | ICD-10-CM | POA: Diagnosis not present

## 2018-08-20 DIAGNOSIS — E785 Hyperlipidemia, unspecified: Secondary | ICD-10-CM | POA: Diagnosis not present

## 2018-08-20 DIAGNOSIS — Z9181 History of falling: Secondary | ICD-10-CM | POA: Diagnosis not present

## 2018-08-20 DIAGNOSIS — F411 Generalized anxiety disorder: Secondary | ICD-10-CM | POA: Diagnosis not present

## 2018-08-20 DIAGNOSIS — Z7982 Long term (current) use of aspirin: Secondary | ICD-10-CM | POA: Diagnosis not present

## 2018-08-20 DIAGNOSIS — M5416 Radiculopathy, lumbar region: Secondary | ICD-10-CM | POA: Diagnosis not present

## 2018-08-20 DIAGNOSIS — I129 Hypertensive chronic kidney disease with stage 1 through stage 4 chronic kidney disease, or unspecified chronic kidney disease: Secondary | ICD-10-CM | POA: Diagnosis not present

## 2018-08-20 DIAGNOSIS — F0390 Unspecified dementia without behavioral disturbance: Secondary | ICD-10-CM | POA: Diagnosis not present

## 2018-08-20 DIAGNOSIS — M170 Bilateral primary osteoarthritis of knee: Secondary | ICD-10-CM | POA: Diagnosis not present

## 2018-08-20 DIAGNOSIS — R32 Unspecified urinary incontinence: Secondary | ICD-10-CM | POA: Diagnosis not present

## 2018-08-20 DIAGNOSIS — M4800 Spinal stenosis, site unspecified: Secondary | ICD-10-CM | POA: Diagnosis not present

## 2018-08-20 DIAGNOSIS — K5909 Other constipation: Secondary | ICD-10-CM | POA: Diagnosis not present

## 2018-08-20 DIAGNOSIS — G43009 Migraine without aura, not intractable, without status migrainosus: Secondary | ICD-10-CM | POA: Diagnosis not present

## 2018-08-20 DIAGNOSIS — M5412 Radiculopathy, cervical region: Secondary | ICD-10-CM | POA: Diagnosis not present

## 2018-09-21 ENCOUNTER — Encounter: Payer: Self-pay | Admitting: Internal Medicine

## 2018-09-21 ENCOUNTER — Ambulatory Visit (INDEPENDENT_AMBULATORY_CARE_PROVIDER_SITE_OTHER): Payer: PPO | Admitting: Internal Medicine

## 2018-09-21 DIAGNOSIS — I10 Essential (primary) hypertension: Secondary | ICD-10-CM | POA: Diagnosis not present

## 2018-09-21 DIAGNOSIS — F32A Depression, unspecified: Secondary | ICD-10-CM

## 2018-09-21 DIAGNOSIS — F329 Major depressive disorder, single episode, unspecified: Secondary | ICD-10-CM

## 2018-09-21 NOTE — Patient Instructions (Signed)
Please continue all other medications as before, and refills have been done if requested.  Please have the pharmacy call with any other refills you may need.  Please continue your efforts at being more active, low cholesterol diet, and weight control.  Please keep your appointments with your specialists as you may have planned     

## 2018-09-21 NOTE — Progress Notes (Signed)
Patient ID: Lynn Howell, female   DOB: 1925-07-04, 85 y.o.   MRN: [id number]  Cumulative time during 7-day interval 12 min, there was not an associated office visit for this concern within a 7 day period.  Verbal consent for services obtained from patient prior to services given.  Names of all persons present for services: Cathlean Cower, MD, patient  Chief complaint: falls  History, background, results pertinent:  Here to f/u; overall doing ok,  Pt denies chest pain, increasing sob or doe, wheezing, orthopnea, PND, increased LE swelling, palpitations, dizziness or syncope.  Pt denies new neurological symptoms such as new headache, or facial or extremity weakness or numbness.  Pt denies polydipsia, polyuria, or low sugar episode.  Pt states overall good compliance with meds.  Does have some rather nonspecific whole arm pain, denies neck pain, but may have some ? RUE weakness as well.  Pt declines OV for consideration for MRI or referral at this time.  Son by phone also mentions BP has been < 120 sbp on regular basis over last 3 days. Denies worsening depressive symptoms, suicidal ideation, or panic Past Medical History:  Diagnosis Date  . ANEMIA-IRON DEFICIENCY 08/01/2007  . ANKLE PAIN, LEFT 01/22/2007  . Arthritis   . BACK PAIN, CHRONIC, INTERMITTENT 02/10/2010  . CERUMEN IMPACTION, RIGHT 04/20/2007  . COLONIC POLYPS, HX OF 09/21/2006  . COMMON MIGRAINE 09/21/2006  . CONSTIPATION, CHRONIC 09/21/2006  . CONTACT DERMATITIS 08/01/2008  . DEGENERATIVE JOINT DISEASE, KNEES, BILATERAL 03/19/2010  . DEPRESSION 08/01/2007  . Hypercholesteremia   . HYPERLIPIDEMIA 08/01/2007  . Hypertension   . HYPERTENSION 09/21/2006  . KNEE PAIN, BILATERAL 09/15/2008  . Left cervical radiculopathy 03/07/2012  . LEG PAIN, RIGHT 03/03/2008  . Liposarcoma of lower extremity ([place]) 04/14/2010  . LUMBAR RADICULOPATHY, RIGHT 03/19/2010  . NEPHROLITHIASIS, HX OF 09/21/2006  . OSTEOPENIA 12/17/2008  . OSTEOPOROSIS 01/22/2007  . SPINAL  STENOSIS 12/17/2008  . Stroke ([place])   . Swelling of limb 03/19/2010  . URI 04/20/2007   No results found for this or any previous visit (from the past 48 hour(s)). Lab Results  Component Value Date   WBC 5.4 07/24/2018   HGB 9.3 (L) 07/24/2018   HCT 29.8 (L) 07/24/2018   PLT 202 07/24/2018   GLUCOSE 99 07/23/2018   CHOL 194 03/21/2018   TRIG 51.0 03/21/2018   HDL 92.30 03/21/2018   LDLDIRECT 134.5 11/01/2012   LDLCALC 91 03/21/2018   ALT 16 03/21/2018   AST 23 03/21/2018   NA 142 07/23/2018   K 3.5 07/23/2018   CL 107 07/23/2018   CREATININE 1.19 (H) 07/23/2018   BUN 20 07/23/2018   CO2 26 07/23/2018   TSH 3.65 03/21/2018   A/P/next steps:   HTN - stable overall by history and exam, recent data reviewed with pt, and pt to continue medical treatment as before,  to f/u any worsening symptoms or concerns  Depression - stable overall by history and exam, recent data reviewed with pt, and pt to continue medical treatment as before,  to f/u any worsening symptoms or concerns  Recurrent falls - none recent, pt declines further needs  Right arm pain - somewhat suspicious for RUE radiculopathy, consider OV if persists or worsens  Cathlean Cower MD

## 2018-10-12 ENCOUNTER — Other Ambulatory Visit: Payer: Self-pay

## 2018-10-12 ENCOUNTER — Ambulatory Visit: Payer: PPO | Admitting: Podiatry

## 2018-10-12 ENCOUNTER — Encounter: Payer: Self-pay | Admitting: Podiatry

## 2018-10-12 DIAGNOSIS — G609 Hereditary and idiopathic neuropathy, unspecified: Secondary | ICD-10-CM

## 2018-10-12 DIAGNOSIS — M79674 Pain in right toe(s): Secondary | ICD-10-CM

## 2018-10-12 DIAGNOSIS — B351 Tinea unguium: Secondary | ICD-10-CM

## 2018-10-12 DIAGNOSIS — M79675 Pain in left toe(s): Secondary | ICD-10-CM | POA: Diagnosis not present

## 2018-10-12 DIAGNOSIS — L84 Corns and callosities: Secondary | ICD-10-CM

## 2018-10-12 NOTE — Patient Instructions (Signed)

## 2018-10-16 NOTE — Progress Notes (Signed)
Subjective: Lynn Howell is seen today for follow up preventative foot care with painful, elongated, thickened toenails 1-5 b/l feet that she cannot cut. Pain interferes with daily activities. Aggravating factor includes wearing enclosed shoe gear and relieved with periodic debridement.  Patient also has corn on right 3rd toe.  Current Outpatient Medications on File Prior to Visit  Medication Sig  . amLODipine (NORVASC) 5 MG tablet Take 1 tablet (5 mg total) by mouth daily.  . Calcium Carb-Cholecalciferol (CALCIUM + D3 PO) Take 600 mg by mouth.  . cholestyramine (QUESTRAN) 4 g packet Take 1 packet (4 g total) by mouth 3 (three) times daily as needed. (Patient taking differently: Take 4 g by mouth 3 (three) times daily as needed (cholesterol). )  . clotrimazole-betamethasone (LOTRISONE) cream Use as directed twice per day as needed to affected areas  . polyethylene glycol powder (GLYCOLAX/MIRALAX) 17 GM/SCOOP powder 17 GRAMS TWICE DAILY  . polyethylene glycol powder (GLYCOLAX/MIRALAX) powder 17 GRAMS TWICE DAILY (Patient taking differently: Take 0.5 Containers by mouth 2 (two) times a day. )  . tiZANidine (ZANAFLEX) 2 MG tablet Take 1 tablet (2 mg total) by mouth every 6 (six) hours as needed for muscle spasms.  Marland Kitchen triamcinolone cream (KENALOG) 0.1 % Apply 1 application topically 2 (two) times daily.   No current facility-administered medications on file prior to visit.      Allergies  Allergen Reactions  . Alendronate Sodium   . Hydrochlorothiazide W-Triamterene   . Norco [Hydrocodone-Acetaminophen] Nausea Only     Objective:  Vascular Examination: Capillary refill time immediate x 10 digits.  Dorsalis pedis and Posterior tibial pulses 1/4 b/l.  Digital hair  present x 10 digits.  Skin temperature gradient WNL b/l.   Dermatological Examination: Skin thin and atrophic b/l.  Toenails 1-5 b/l discolored, thick, dystrophic with subungual debris and pain with palpation to nailbeds  due to thickness of nails.  Hyperkeratotic lesion distal tip right 3rd digit. No erythema, no edema, no drainage, no flocculence noted.  There is noted onchyolysis of entire nailplate of the left 4th toe. The nailbed remains intact. Thee is no erythema, no edema, no drainage, no underlying flocculence.  Musculoskeletal: Muscle strength 5/5 to all LE muscle groups b/l.  Rigid hammertoe deformity 2-5 b/l.  No pain, crepitus or joint limitation noted with ROM.   Neurological Examination: Protective sensation diminished with 10 gram monofilament bilaterally.  Epicritic sensation diminished bilaterally.  Assessment: Painful onychomycosis toenails 1-5 b/l  Corn distal tip right 3rd digit Peripheral neuropathy  Plan: 1. Toenails 1-5 b/l were debrided in length and girth without iatrogenic bleeding. Corn right 3rd digit pared utilizing sterile scalpel blade without incident. Patient to continue soft, supportive shoe gear. Patient to report any pedal injuries to medical professional immediately. Follow up 3 months.  Patient/POA to call should there be a concern in the interim.

## 2018-11-30 ENCOUNTER — Other Ambulatory Visit: Payer: Self-pay | Admitting: Internal Medicine

## 2018-12-26 ENCOUNTER — Ambulatory Visit (INDEPENDENT_AMBULATORY_CARE_PROVIDER_SITE_OTHER): Payer: PPO | Admitting: Internal Medicine

## 2018-12-26 ENCOUNTER — Encounter: Payer: Self-pay | Admitting: Internal Medicine

## 2018-12-26 DIAGNOSIS — C4922 Malignant neoplasm of connective and soft tissue of left lower limb, including hip: Secondary | ICD-10-CM | POA: Diagnosis not present

## 2018-12-26 NOTE — Assessment & Plan Note (Signed)
See notes

## 2018-12-26 NOTE — Patient Instructions (Signed)
Please continue all other medications as before, and refills have been done if requested.  Please have the pharmacy call with any other refills you may need.  Please continue your efforts at being more active, low cholesterol diet, and weight control.  Keep your appointments with your specialists as you may have planned

## 2018-12-26 NOTE — Progress Notes (Signed)
Patient ID: Lynn Howell, female   DOB: 02-27-1925, 85 y.o.   MRN: [id number]  Phone Visit   Cumulative time during 7-day interval 12 min, there was not an associated office visit for this concern within a 7 day period.  Verbal consent for services obtained from patient prior to services given.  Names of all persons present for services: Cathlean Cower, MD, patient and son on speakerphone  Chief complaint: dementia and medical disorders  History, background, results pertinent:  Here to f/u; overall doing ok,  Pt denies chest pain, increasing sob or doe, wheezing, orthopnea, PND, increased LE swelling, palpitations, dizziness or syncope.  Pt denies new neurological symptoms such as new headache, or facial or extremity weakness or numbness.  Pt denies polydipsia, polyuria, or low sugar episode.  Pt states overall good compliance with meds, mostly trying to follow appropriate diet, with wt overall stable, BP athome 123.62 this am, wt stable at 119. No recent falls. LLE sarcoma may be somewhat larger as now circumference by tape measure is 22 cm, whereas before it was 21.5.  Denies worsening depressive symptoms, suicidal ideation, or panic; has ongoing anxiety, stable.  Has chronic constipation, but no worsening recently.  Past Medical History:  Diagnosis Date  . ANEMIA-IRON DEFICIENCY 08/01/2007  . ANKLE PAIN, LEFT 01/22/2007  . Arthritis   . BACK PAIN, CHRONIC, INTERMITTENT 02/10/2010  . CERUMEN IMPACTION, RIGHT 04/20/2007  . COLONIC POLYPS, HX OF 09/21/2006  . COMMON MIGRAINE 09/21/2006  . CONSTIPATION, CHRONIC 09/21/2006  . CONTACT DERMATITIS 08/01/2008  . DEGENERATIVE JOINT DISEASE, KNEES, BILATERAL 03/19/2010  . DEPRESSION 08/01/2007  . Hypercholesteremia   . HYPERLIPIDEMIA 08/01/2007  . Hypertension   . HYPERTENSION 09/21/2006  . KNEE PAIN, BILATERAL 09/15/2008  . Left cervical radiculopathy 03/07/2012  . LEG PAIN, RIGHT 03/03/2008  . Liposarcoma of lower extremity ([place]) 04/14/2010  . LUMBAR  RADICULOPATHY, RIGHT 03/19/2010  . NEPHROLITHIASIS, HX OF 09/21/2006  . OSTEOPENIA 12/17/2008  . OSTEOPOROSIS 01/22/2007  . SPINAL STENOSIS 12/17/2008  . Stroke ([place])   . Swelling of limb 03/19/2010  . URI 04/20/2007   No results found for this or any previous visit (from the past 48 hour(s)). A/P/next steps:  1) gait - stable 2)  HTN - stable 3)  Sarcoma - stable 4)  Dementia - stable 5)  Constipation, chronic - stable  Cathlean Cower MD

## 2019-01-09 ENCOUNTER — Ambulatory Visit: Payer: PPO | Admitting: Podiatry

## 2019-01-09 ENCOUNTER — Other Ambulatory Visit: Payer: Self-pay

## 2019-01-09 ENCOUNTER — Encounter: Payer: Self-pay | Admitting: Podiatry

## 2019-01-09 DIAGNOSIS — G609 Hereditary and idiopathic neuropathy, unspecified: Secondary | ICD-10-CM | POA: Diagnosis not present

## 2019-01-09 DIAGNOSIS — L84 Corns and callosities: Secondary | ICD-10-CM

## 2019-01-09 DIAGNOSIS — B351 Tinea unguium: Secondary | ICD-10-CM

## 2019-01-09 DIAGNOSIS — M79674 Pain in right toe(s): Secondary | ICD-10-CM

## 2019-01-09 DIAGNOSIS — M79675 Pain in left toe(s): Secondary | ICD-10-CM

## 2019-01-09 NOTE — Patient Instructions (Signed)

## 2019-01-14 NOTE — Progress Notes (Signed)
Subjective:  Lynn Howell presents to clinic today with cc of  painful, thick, discolored, elongated toenails  of both feet that become tender and patient cannot cut because of thickness. Pain is aggravated when wearing enclosed shoe gear and relieved with periodic professional debridement.  Patient voices no new pedal concerns on today's visit.  Medications reviewed in chart.  Allergies  Allergen Reactions  . Alendronate Sodium   . Hydrochlorothiazide W-Triamterene   . Norco [Hydrocodone-Acetaminophen] Nausea Only   Objective:  Physical Examination:  Vascular Examination: Capillary refill time immediate b/l.  Palpable DP/PT pulses b/l.  Digital hair present b/l.   No edema noted b/l.  Skin temperature gradient WNL b/l.  Dermatological Examination: Skin thin and atrophic b/l.  No open wounds b/l.  No interdigital macerations noted b/l.  Elongated, thick, discolored brittle toenails with subungual debris and pain on dorsal palpation of nailbeds 1-5 b/l.  Hyperkeratotic lesion distal tip right 3rd digit. No erythema, no edema, no drainage, no flocculence noted.  Musculoskeletal Examination: Muscle strength 5/5 to all muscle groups b/l.  No pain, crepitus or joint discomfort with active/passive ROM.  Neurological Examination: Sensation diminished b/l  with 10 gram monofilament.  Assessment: Mycotic nail infection with pain 1-5 b/l Corn right 3rd digit Neuropathy  Plan: 1. Toenails 1-5 b/l were debrided in length and girth without iatrogenic laceration. 2. Corn(s) right 3rd digit pared utilizing sterile scalpel blade without incident. 3. Continue soft, supportive shoe gear daily. 4. Report any pedal injuries to medical professional. 5. Follow up 3 months. 6. Patient/POA to call should there be a question/concern in there interim.

## 2019-02-07 ENCOUNTER — Telehealth: Payer: Self-pay | Admitting: Internal Medicine

## 2019-02-07 NOTE — Telephone Encounter (Signed)
Patient son dropped off [employer] forms to be completed for the patient.   Forms have been completed & Placed in providers box to review and sign.

## 2019-02-12 DIAGNOSIS — Z0279 Encounter for issue of other medical certificate: Secondary | ICD-10-CM

## 2019-02-12 NOTE — Telephone Encounter (Signed)
Forms have been completed & Faxed, Copy sent to scan & Charged for.   Son has been informed and original mailed.

## 2019-02-25 ENCOUNTER — Telehealth: Payer: Self-pay

## 2019-02-25 NOTE — Telephone Encounter (Signed)
New message   Home health calling   Checking on the status of Documentation fax over 1/29 to [phone]

## 2019-02-26 NOTE — Telephone Encounter (Signed)
Video or in person will be needed to completely evaluate signs and symptoms.

## 2019-02-26 NOTE — Telephone Encounter (Signed)
New message    Son calling wants to know can Dr. Jenny Reichmann prescribe something to help his mom sleep at night. Up wondering around

## 2019-02-27 NOTE — Telephone Encounter (Signed)
Spoke with pt son, he was advised that in order for the medication to be prescribed she must come in for evaluation first. He verbalized understanding and stated when his niece came he was going to call back to schedule a video visit.

## 2019-03-06 ENCOUNTER — Ambulatory Visit (INDEPENDENT_AMBULATORY_CARE_PROVIDER_SITE_OTHER): Payer: PPO | Admitting: Internal Medicine

## 2019-03-06 ENCOUNTER — Encounter: Payer: Self-pay | Admitting: Internal Medicine

## 2019-03-06 ENCOUNTER — Other Ambulatory Visit: Payer: Self-pay

## 2019-03-06 VITALS — BP 136/68 | HR 80 | Temp 98.0°F | Wt 119.0 lb

## 2019-03-06 DIAGNOSIS — F0390 Unspecified dementia without behavioral disturbance: Secondary | ICD-10-CM | POA: Diagnosis not present

## 2019-03-06 DIAGNOSIS — D649 Anemia, unspecified: Secondary | ICD-10-CM | POA: Diagnosis not present

## 2019-03-06 DIAGNOSIS — G4700 Insomnia, unspecified: Secondary | ICD-10-CM | POA: Diagnosis not present

## 2019-03-06 DIAGNOSIS — R269 Unspecified abnormalities of gait and mobility: Secondary | ICD-10-CM | POA: Diagnosis not present

## 2019-03-06 DIAGNOSIS — R531 Weakness: Secondary | ICD-10-CM | POA: Diagnosis not present

## 2019-03-06 DIAGNOSIS — Z0000 Encounter for general adult medical examination without abnormal findings: Secondary | ICD-10-CM

## 2019-03-06 LAB — HEPATIC FUNCTION PANEL
ALT: 10 U/L (ref 0–35)
AST: 17 U/L (ref 0–37)
Albumin: 3.6 g/dL (ref 3.5–5.2)
Alkaline Phosphatase: 107 U/L (ref 39–117)
Bilirubin, Direct: 0.1 mg/dL (ref 0.0–0.3)
Total Bilirubin: 0.4 mg/dL (ref 0.2–1.2)
Total Protein: 6.6 g/dL (ref 6.0–8.3)

## 2019-03-06 LAB — CBC WITH DIFFERENTIAL/PLATELET
Basophils Absolute: 0 10*3/uL (ref 0.0–0.1)
Basophils Relative: 0.8 % (ref 0.0–3.0)
Eosinophils Absolute: 0 10*3/uL (ref 0.0–0.7)
Eosinophils Relative: 0.2 % (ref 0.0–5.0)
HCT: 30.5 % — ABNORMAL LOW (ref 36.0–46.0)
Hemoglobin: 10 g/dL — ABNORMAL LOW (ref 12.0–15.0)
Lymphocytes Relative: 23.8 % (ref 12.0–46.0)
Lymphs Abs: 1.3 10*3/uL (ref 0.7–4.0)
MCHC: 32.7 g/dL (ref 30.0–36.0)
MCV: 89.2 fl (ref 78.0–100.0)
Monocytes Absolute: 0.3 10*3/uL (ref 0.1–1.0)
Monocytes Relative: 5.7 % (ref 3.0–12.0)
Neutro Abs: 3.8 10*3/uL (ref 1.4–7.7)
Neutrophils Relative %: 69.5 % (ref 43.0–77.0)
Platelets: 279 10*3/uL (ref 150.0–400.0)
RBC: 3.41 Mil/uL — ABNORMAL LOW (ref 3.87–5.11)
RDW: 13.9 % (ref 11.5–15.5)
WBC: 5.5 10*3/uL (ref 4.0–10.5)

## 2019-03-06 LAB — BASIC METABOLIC PANEL
BUN: 19 mg/dL (ref 6–23)
CO2: 27 mEq/L (ref 19–32)
Calcium: 9.4 mg/dL (ref 8.4–10.5)
Chloride: 103 mEq/L (ref 96–112)
Creatinine, Ser: 1.14 mg/dL (ref 0.40–1.20)
GFR: 54.02 mL/min — ABNORMAL LOW (ref >60.00)
Glucose, Bld: 88 mg/dL (ref 70–99)
Potassium: 3.7 mEq/L (ref 3.5–5.1)
Sodium: 139 mEq/L (ref 135–145)

## 2019-03-06 LAB — LIPID PANEL
Cholesterol: 177 mg/dL (ref 0–200)
HDL: 69.7 mg/dL (ref >39.00)
LDL Cholesterol: 94 mg/dL (ref 0–99)
NonHDL: 107.74
Total CHOL/HDL Ratio: 3
Triglycerides: 68 mg/dL (ref 0.0–149.0)
VLDL: 13.6 mg/dL (ref 0.0–40.0)

## 2019-03-06 LAB — TSH: TSH: 2.52 u[IU]/mL (ref 0.35–4.50)

## 2019-03-06 LAB — VITAMIN B12: Vitamin B-12: 255 pg/mL (ref 211–911)

## 2019-03-06 MED ORDER — QUETIAPINE FUMARATE 50 MG PO TABS: 50.0000 mg | ORAL_TABLET | Freq: Every day | ORAL | 1 refills | Status: AC

## 2019-03-06 MED ORDER — IRBESARTAN-HYDROCHLOROTHIAZIDE 150-12.5 MG PO TABS: ORAL_TABLET | ORAL | 3 refills | Status: DC

## 2019-03-06 MED ORDER — AMLODIPINE BESYLATE 5 MG PO TABS: 5.0000 mg | ORAL_TABLET | Freq: Every day | ORAL | 3 refills | Status: AC

## 2019-03-06 NOTE — Assessment & Plan Note (Signed)
Mild worsening, declines further tx fo rnow

## 2019-03-06 NOTE — Assessment & Plan Note (Signed)
For seroquel 50 qhs 

## 2019-03-06 NOTE — Progress Notes (Addendum)
Subjective:    Patient ID: Lynn Howell, female    DOB: 28-Mar-1925, 86 y.o.   MRN: [id number]  HPI  Here for wellness and f/u;  Overall doing ok;  Pt denies Chest pain, worsening SOB, DOE, wheezing, orthopnea, PND, worsening LE edema, palpitations, dizziness or syncope.  Pt denies neurological change such as new headache, facial or extremity weakness.  Pt denies polydipsia, polyuria, or low sugar symptoms. Pt states overall good compliance with treatment and medications, good tolerability, and has been trying to follow appropriate diet.  Pt denies worsening depressive symptoms, suicidal ideation or panic. No fever, night sweats, wt loss, loss of appetite, or other constitutional symptoms.  Pt states good ability with ADL's, has low fall risk, home safety reviewed and adequate, no other significant changes in hearing or vision, and only occasionally active with exercise. Also with speech much reduced, balance worse, sleeping difficulty with wandering at night and gait difficulty, asking for med and HH. Pt needs home hosp bed DME - -Patient requires frequent repositioning of the body in ways that cannot be achieved in an ordinary bed to alleviate pain or pressure.  Past Medical History:  Diagnosis Date  . ANEMIA-IRON DEFICIENCY 08/01/2007  . ANKLE PAIN, LEFT 01/22/2007  . Arthritis   . BACK PAIN, CHRONIC, INTERMITTENT 02/10/2010  . CERUMEN IMPACTION, RIGHT 04/20/2007  . COLONIC POLYPS, HX OF 09/21/2006  . COMMON MIGRAINE 09/21/2006  . CONSTIPATION, CHRONIC 09/21/2006  . CONTACT DERMATITIS 08/01/2008  . DEGENERATIVE JOINT DISEASE, KNEES, BILATERAL 03/19/2010  . DEPRESSION 08/01/2007  . Hypercholesteremia   . HYPERLIPIDEMIA 08/01/2007  . Hypertension   . HYPERTENSION 09/21/2006  . KNEE PAIN, BILATERAL 09/15/2008  . Left cervical radiculopathy 03/07/2012  . LEG PAIN, RIGHT 03/03/2008  . 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 LUMBAR RADICULOPATHY, RIGHT 03/19/2010  . NEPHROLITHIASIS, HX OF 09/21/2006    . OSTEOPENIA 12/17/2008  . OSTEOPOROSIS 01/22/2007  . SPINAL STENOSIS 12/17/2008  . Stroke ([place])   . Swelling of limb 03/19/2010  . URI 04/20/2007   Past Surgical History:  Procedure Laterality Date  . ABDOMINAL HYSTERECTOMY    . CHOLECYSTECTOMY    . TUBAL LIGATION      reports that she has never smoked. She has never used smokeless tobacco. She reports that she does not drink alcohol or use drugs. family history includes Cancer in her mother and sister; Diabetes in her brother; Hypertension in her brother and sister. Allergies  Allergen Reactions  . Alendronate Sodium   . Hydrochlorothiazide W-Triamterene   . Norco [Hydrocodone-Acetaminophen] Nausea Only   Current Outpatient Medications on File Prior to Visit  Medication Sig Dispense Refill  . Calcium Carb-Cholecalciferol (CALCIUM + D3 PO) Take 600 mg by mouth.    . polyethylene glycol powder (GLYCOLAX/MIRALAX) powder 17 GRAMS TWICE DAILY (Patient not taking: No sig reported) 527 g 2   No current facility-administered medications on file prior to visit.   Review of Systems All otherwise neg per pt    Objective:   Physical Exam BP 136/68   Pulse 80   Temp 98 F (36.7 C)   Wt 119 lb (54 kg)   BMI 18.09 kg/m  VS noted,  Constitutional: Pt appears in NAD HENT: Head: NCAT.  Right Ear: External ear normal.  Left Ear: External ear normal.  Eyes: . Pupils are equal, round, and reactive to light. Conjunctivae and EOM are normal Nose: without d/c or deformity Neck: Neck supple. Gross normal ROM Cardiovascular: Normal rate and regular  rhythm.   Pulmonary/Chest: Effort normal and breath sounds without rales or wheezing.  Abd:  Soft, NT, ND, + BS, no organomegaly Neurological: Pt is alert. At baseline orientation, motor grossly intact Skin: Skin is warm. No rashes, other new lesions, no LE edema Psychiatric: Pt behavior is normal without agitation  All otherwise neg per pt Lab Results  Component Value Date   WBC 5.5 03/06/2019    HGB 10.0 (L) 03/06/2019   HCT 30.5 (L) 03/06/2019   PLT 279.0 03/06/2019   GLUCOSE 88 03/06/2019   CHOL 177 03/06/2019   TRIG 68.0 03/06/2019   HDL 69.70 03/06/2019   LDLDIRECT 134.5 11/01/2012   LDLCALC 94 03/06/2019   ALT 10 03/06/2019   AST 17 03/06/2019   NA 139 03/06/2019   K 3.7 03/06/2019   CL 103 03/06/2019   CREATININE 1.14 03/06/2019   BUN 19 03/06/2019   CO2 27 03/06/2019   TSH 2.52 03/06/2019      Assessment & Plan:

## 2019-03-06 NOTE — Assessment & Plan Note (Signed)
Using wheelchair more recently

## 2019-03-06 NOTE — Patient Instructions (Signed)
Please take all new medication as prescribed - the seroquel at bedtime  You will be contacted regarding the referral for: [facility] with Physical Therapy, RN, and aide  Please continue all other medications as before, and refills have been done if requested.  Please have the pharmacy call with any other refills you may need.  Please continue your efforts at being more active, low cholesterol diet, and weight control.  Please keep your appointments with your specialists as you may have planned  Please go to the LAB at the blood drawing area for the tests to be done  You will be contacted by phone if any changes need to be made immediately.  Otherwise, you will receive a letter about your results with an explanation, but please check with MyChart first.  Please remember to sign up for MyChart if you have not done so, as this will be important to you in the future with finding out test results, communicating by private email, and scheduling acute appointments online when needed.  Please make an Appointment to return in 6 months, or sooner if needed

## 2019-03-06 NOTE — Assessment & Plan Note (Signed)

## 2019-03-06 NOTE — Assessment & Plan Note (Signed)
For PT with [facility] and aide

## 2019-03-07 ENCOUNTER — Encounter: Payer: Self-pay | Admitting: Internal Medicine

## 2019-03-08 ENCOUNTER — Other Ambulatory Visit (INDEPENDENT_AMBULATORY_CARE_PROVIDER_SITE_OTHER): Payer: PPO

## 2019-03-08 DIAGNOSIS — Z0000 Encounter for general adult medical examination without abnormal findings: Secondary | ICD-10-CM | POA: Diagnosis not present

## 2019-03-08 LAB — URINALYSIS, ROUTINE W REFLEX MICROSCOPIC
Bilirubin Urine: NEGATIVE
Hgb urine dipstick: NEGATIVE
Ketones, ur: NEGATIVE
Nitrite: NEGATIVE
Specific Gravity, Urine: 1.02 (ref 1.000–1.030)
Total Protein, Urine: NEGATIVE
Urine Glucose: NEGATIVE
Urobilinogen, UA: 0.2 (ref 0.0–1.0)
pH: 7 (ref 5.0–8.0)

## 2019-03-10 DIAGNOSIS — Z8673 Personal history of transient ischemic attack (TIA), and cerebral infarction without residual deficits: Secondary | ICD-10-CM | POA: Diagnosis not present

## 2019-03-10 DIAGNOSIS — G609 Hereditary and idiopathic neuropathy, unspecified: Secondary | ICD-10-CM | POA: Diagnosis not present

## 2019-03-10 DIAGNOSIS — M4800 Spinal stenosis, site unspecified: Secondary | ICD-10-CM | POA: Diagnosis not present

## 2019-03-10 DIAGNOSIS — C4922 Malignant neoplasm of connective and soft tissue of left lower limb, including hip: Secondary | ICD-10-CM | POA: Diagnosis not present

## 2019-03-10 DIAGNOSIS — M810 Age-related osteoporosis without current pathological fracture: Secondary | ICD-10-CM | POA: Diagnosis not present

## 2019-03-10 DIAGNOSIS — F0390 Unspecified dementia without behavioral disturbance: Secondary | ICD-10-CM | POA: Diagnosis not present

## 2019-03-10 DIAGNOSIS — I872 Venous insufficiency (chronic) (peripheral): Secondary | ICD-10-CM | POA: Diagnosis not present

## 2019-03-10 DIAGNOSIS — M5412 Radiculopathy, cervical region: Secondary | ICD-10-CM | POA: Diagnosis not present

## 2019-03-10 DIAGNOSIS — Z87820 Personal history of traumatic brain injury: Secondary | ICD-10-CM | POA: Diagnosis not present

## 2019-03-10 DIAGNOSIS — I129 Hypertensive chronic kidney disease with stage 1 through stage 4 chronic kidney disease, or unspecified chronic kidney disease: Secondary | ICD-10-CM | POA: Diagnosis not present

## 2019-03-10 DIAGNOSIS — D630 Anemia in neoplastic disease: Secondary | ICD-10-CM | POA: Diagnosis not present

## 2019-03-10 DIAGNOSIS — D509 Iron deficiency anemia, unspecified: Secondary | ICD-10-CM | POA: Diagnosis not present

## 2019-03-10 DIAGNOSIS — G4700 Insomnia, unspecified: Secondary | ICD-10-CM | POA: Diagnosis not present

## 2019-03-10 DIAGNOSIS — G8929 Other chronic pain: Secondary | ICD-10-CM | POA: Diagnosis not present

## 2019-03-10 DIAGNOSIS — D631 Anemia in chronic kidney disease: Secondary | ICD-10-CM | POA: Diagnosis not present

## 2019-03-10 DIAGNOSIS — E785 Hyperlipidemia, unspecified: Secondary | ICD-10-CM | POA: Diagnosis not present

## 2019-03-10 DIAGNOSIS — M5416 Radiculopathy, lumbar region: Secondary | ICD-10-CM | POA: Diagnosis not present

## 2019-03-10 DIAGNOSIS — F329 Major depressive disorder, single episode, unspecified: Secondary | ICD-10-CM | POA: Diagnosis not present

## 2019-03-10 DIAGNOSIS — F411 Generalized anxiety disorder: Secondary | ICD-10-CM | POA: Diagnosis not present

## 2019-03-10 DIAGNOSIS — G43009 Migraine without aura, not intractable, without status migrainosus: Secondary | ICD-10-CM | POA: Diagnosis not present

## 2019-03-10 DIAGNOSIS — N183 Chronic kidney disease, stage 3 unspecified: Secondary | ICD-10-CM | POA: Diagnosis not present

## 2019-03-10 DIAGNOSIS — K5909 Other constipation: Secondary | ICD-10-CM | POA: Diagnosis not present

## 2019-03-10 DIAGNOSIS — M170 Bilateral primary osteoarthritis of knee: Secondary | ICD-10-CM | POA: Diagnosis not present

## 2019-03-12 ENCOUNTER — Telehealth: Payer: Self-pay | Admitting: Internal Medicine

## 2019-03-12 DIAGNOSIS — M549 Dorsalgia, unspecified: Secondary | ICD-10-CM

## 2019-03-12 DIAGNOSIS — R269 Unspecified abnormalities of gait and mobility: Secondary | ICD-10-CM

## 2019-03-12 DIAGNOSIS — F0390 Unspecified dementia without behavioral disturbance: Secondary | ICD-10-CM

## 2019-03-12 DIAGNOSIS — G8929 Other chronic pain: Secondary | ICD-10-CM

## 2019-03-12 NOTE — Telephone Encounter (Signed)
   What type of DME ([place]) would like your provider to order? Hospital bed  Who would like to get the DME from? None specified   Last visit with PCP (>3 months requires and appointment for insurance to cover the cost = please schedule patient for visit to discuss medical necessity for DME)? 03/06/19

## 2019-03-14 NOTE — Telephone Encounter (Signed)
Done hardcopy 

## 2019-03-14 NOTE — Telephone Encounter (Signed)
Community message sent to Lenna Sciara and Anderson Malta with [employer] to work on DME request.

## 2019-03-15 DIAGNOSIS — Z87820 Personal history of traumatic brain injury: Secondary | ICD-10-CM | POA: Diagnosis not present

## 2019-03-15 DIAGNOSIS — D630 Anemia in neoplastic disease: Secondary | ICD-10-CM | POA: Diagnosis not present

## 2019-03-15 DIAGNOSIS — C4922 Malignant neoplasm of connective and soft tissue of left lower limb, including hip: Secondary | ICD-10-CM | POA: Diagnosis not present

## 2019-03-15 DIAGNOSIS — I872 Venous insufficiency (chronic) (peripheral): Secondary | ICD-10-CM | POA: Diagnosis not present

## 2019-03-15 DIAGNOSIS — D631 Anemia in chronic kidney disease: Secondary | ICD-10-CM | POA: Diagnosis not present

## 2019-03-15 DIAGNOSIS — M5412 Radiculopathy, cervical region: Secondary | ICD-10-CM | POA: Diagnosis not present

## 2019-03-15 DIAGNOSIS — F329 Major depressive disorder, single episode, unspecified: Secondary | ICD-10-CM | POA: Diagnosis not present

## 2019-03-15 DIAGNOSIS — F411 Generalized anxiety disorder: Secondary | ICD-10-CM | POA: Diagnosis not present

## 2019-03-15 DIAGNOSIS — D509 Iron deficiency anemia, unspecified: Secondary | ICD-10-CM | POA: Diagnosis not present

## 2019-03-15 DIAGNOSIS — M810 Age-related osteoporosis without current pathological fracture: Secondary | ICD-10-CM | POA: Diagnosis not present

## 2019-03-15 DIAGNOSIS — M170 Bilateral primary osteoarthritis of knee: Secondary | ICD-10-CM | POA: Diagnosis not present

## 2019-03-15 DIAGNOSIS — E785 Hyperlipidemia, unspecified: Secondary | ICD-10-CM | POA: Diagnosis not present

## 2019-03-15 DIAGNOSIS — G4700 Insomnia, unspecified: Secondary | ICD-10-CM | POA: Diagnosis not present

## 2019-03-15 DIAGNOSIS — M5416 Radiculopathy, lumbar region: Secondary | ICD-10-CM | POA: Diagnosis not present

## 2019-03-15 DIAGNOSIS — G43009 Migraine without aura, not intractable, without status migrainosus: Secondary | ICD-10-CM | POA: Diagnosis not present

## 2019-03-15 DIAGNOSIS — N183 Chronic kidney disease, stage 3 unspecified: Secondary | ICD-10-CM | POA: Diagnosis not present

## 2019-03-15 DIAGNOSIS — G609 Hereditary and idiopathic neuropathy, unspecified: Secondary | ICD-10-CM | POA: Diagnosis not present

## 2019-03-15 DIAGNOSIS — M4800 Spinal stenosis, site unspecified: Secondary | ICD-10-CM | POA: Diagnosis not present

## 2019-03-15 DIAGNOSIS — G8929 Other chronic pain: Secondary | ICD-10-CM | POA: Diagnosis not present

## 2019-03-15 DIAGNOSIS — K5909 Other constipation: Secondary | ICD-10-CM | POA: Diagnosis not present

## 2019-03-15 DIAGNOSIS — F0390 Unspecified dementia without behavioral disturbance: Secondary | ICD-10-CM | POA: Diagnosis not present

## 2019-03-15 DIAGNOSIS — Z8673 Personal history of transient ischemic attack (TIA), and cerebral infarction without residual deficits: Secondary | ICD-10-CM | POA: Diagnosis not present

## 2019-03-15 DIAGNOSIS — I129 Hypertensive chronic kidney disease with stage 1 through stage 4 chronic kidney disease, or unspecified chronic kidney disease: Secondary | ICD-10-CM | POA: Diagnosis not present

## 2019-03-18 ENCOUNTER — Telehealth: Payer: Self-pay

## 2019-03-18 DIAGNOSIS — M4800 Spinal stenosis, site unspecified: Secondary | ICD-10-CM | POA: Diagnosis not present

## 2019-03-18 DIAGNOSIS — G609 Hereditary and idiopathic neuropathy, unspecified: Secondary | ICD-10-CM | POA: Diagnosis not present

## 2019-03-18 DIAGNOSIS — D630 Anemia in neoplastic disease: Secondary | ICD-10-CM | POA: Diagnosis not present

## 2019-03-18 DIAGNOSIS — I872 Venous insufficiency (chronic) (peripheral): Secondary | ICD-10-CM | POA: Diagnosis not present

## 2019-03-18 DIAGNOSIS — G43009 Migraine without aura, not intractable, without status migrainosus: Secondary | ICD-10-CM | POA: Diagnosis not present

## 2019-03-18 DIAGNOSIS — M810 Age-related osteoporosis without current pathological fracture: Secondary | ICD-10-CM | POA: Diagnosis not present

## 2019-03-18 DIAGNOSIS — M170 Bilateral primary osteoarthritis of knee: Secondary | ICD-10-CM | POA: Diagnosis not present

## 2019-03-18 DIAGNOSIS — C4922 Malignant neoplasm of connective and soft tissue of left lower limb, including hip: Secondary | ICD-10-CM | POA: Diagnosis not present

## 2019-03-18 DIAGNOSIS — E785 Hyperlipidemia, unspecified: Secondary | ICD-10-CM | POA: Diagnosis not present

## 2019-03-18 DIAGNOSIS — K5909 Other constipation: Secondary | ICD-10-CM | POA: Diagnosis not present

## 2019-03-18 DIAGNOSIS — F329 Major depressive disorder, single episode, unspecified: Secondary | ICD-10-CM | POA: Diagnosis not present

## 2019-03-18 DIAGNOSIS — G4700 Insomnia, unspecified: Secondary | ICD-10-CM | POA: Diagnosis not present

## 2019-03-18 DIAGNOSIS — F411 Generalized anxiety disorder: Secondary | ICD-10-CM | POA: Diagnosis not present

## 2019-03-18 DIAGNOSIS — G8929 Other chronic pain: Secondary | ICD-10-CM | POA: Diagnosis not present

## 2019-03-18 DIAGNOSIS — Z87820 Personal history of traumatic brain injury: Secondary | ICD-10-CM | POA: Diagnosis not present

## 2019-03-18 DIAGNOSIS — Z8673 Personal history of transient ischemic attack (TIA), and cerebral infarction without residual deficits: Secondary | ICD-10-CM | POA: Diagnosis not present

## 2019-03-18 DIAGNOSIS — I129 Hypertensive chronic kidney disease with stage 1 through stage 4 chronic kidney disease, or unspecified chronic kidney disease: Secondary | ICD-10-CM | POA: Diagnosis not present

## 2019-03-18 DIAGNOSIS — M5416 Radiculopathy, lumbar region: Secondary | ICD-10-CM | POA: Diagnosis not present

## 2019-03-18 DIAGNOSIS — M5412 Radiculopathy, cervical region: Secondary | ICD-10-CM | POA: Diagnosis not present

## 2019-03-18 DIAGNOSIS — N183 Chronic kidney disease, stage 3 unspecified: Secondary | ICD-10-CM | POA: Diagnosis not present

## 2019-03-18 DIAGNOSIS — F0390 Unspecified dementia without behavioral disturbance: Secondary | ICD-10-CM | POA: Diagnosis not present

## 2019-03-18 DIAGNOSIS — D509 Iron deficiency anemia, unspecified: Secondary | ICD-10-CM | POA: Diagnosis not present

## 2019-03-18 DIAGNOSIS — D631 Anemia in chronic kidney disease: Secondary | ICD-10-CM | POA: Diagnosis not present

## 2019-03-18 NOTE — Telephone Encounter (Signed)
Ok for verbal 

## 2019-03-18 NOTE — Telephone Encounter (Signed)
RN Festus Holts with [facility]:   Request for urgent orders for pt due to quick decline.    Office [phone] ask for Claiborne Billings or Amy to give verbal order to have [facility] evaluate patient for Hospice.   Call to [facility] office to give verbal okay for Hospice Evaluation.

## 2019-03-20 ENCOUNTER — Telehealth: Payer: Self-pay

## 2019-03-20 NOTE — Telephone Encounter (Signed)
New message    Fyi.Lynn Howell will be sending a home health services letter out to the office and patient.

## 2019-03-20 NOTE — Telephone Encounter (Signed)
Noted  

## 2019-03-27 ENCOUNTER — Ambulatory Visit: Payer: PPO | Admitting: Internal Medicine

## 2019-03-27 DIAGNOSIS — Z8673 Personal history of transient ischemic attack (TIA), and cerebral infarction without residual deficits: Secondary | ICD-10-CM | POA: Diagnosis not present

## 2019-03-27 DIAGNOSIS — M5416 Radiculopathy, lumbar region: Secondary | ICD-10-CM | POA: Diagnosis not present

## 2019-03-27 DIAGNOSIS — I872 Venous insufficiency (chronic) (peripheral): Secondary | ICD-10-CM | POA: Diagnosis not present

## 2019-03-27 DIAGNOSIS — C4922 Malignant neoplasm of connective and soft tissue of left lower limb, including hip: Secondary | ICD-10-CM | POA: Diagnosis not present

## 2019-03-27 DIAGNOSIS — D630 Anemia in neoplastic disease: Secondary | ICD-10-CM | POA: Diagnosis not present

## 2019-03-27 DIAGNOSIS — M4800 Spinal stenosis, site unspecified: Secondary | ICD-10-CM | POA: Diagnosis not present

## 2019-03-27 DIAGNOSIS — E785 Hyperlipidemia, unspecified: Secondary | ICD-10-CM | POA: Diagnosis not present

## 2019-03-27 DIAGNOSIS — Z87820 Personal history of traumatic brain injury: Secondary | ICD-10-CM | POA: Diagnosis not present

## 2019-03-27 DIAGNOSIS — K5909 Other constipation: Secondary | ICD-10-CM | POA: Diagnosis not present

## 2019-03-27 DIAGNOSIS — I129 Hypertensive chronic kidney disease with stage 1 through stage 4 chronic kidney disease, or unspecified chronic kidney disease: Secondary | ICD-10-CM | POA: Diagnosis not present

## 2019-03-27 DIAGNOSIS — F0390 Unspecified dementia without behavioral disturbance: Secondary | ICD-10-CM | POA: Diagnosis not present

## 2019-03-27 DIAGNOSIS — F329 Major depressive disorder, single episode, unspecified: Secondary | ICD-10-CM | POA: Diagnosis not present

## 2019-03-27 DIAGNOSIS — M170 Bilateral primary osteoarthritis of knee: Secondary | ICD-10-CM | POA: Diagnosis not present

## 2019-03-27 DIAGNOSIS — D631 Anemia in chronic kidney disease: Secondary | ICD-10-CM | POA: Diagnosis not present

## 2019-03-27 DIAGNOSIS — M5412 Radiculopathy, cervical region: Secondary | ICD-10-CM | POA: Diagnosis not present

## 2019-03-27 DIAGNOSIS — G4700 Insomnia, unspecified: Secondary | ICD-10-CM | POA: Diagnosis not present

## 2019-03-27 DIAGNOSIS — N183 Chronic kidney disease, stage 3 unspecified: Secondary | ICD-10-CM | POA: Diagnosis not present

## 2019-03-27 DIAGNOSIS — F411 Generalized anxiety disorder: Secondary | ICD-10-CM | POA: Diagnosis not present

## 2019-03-27 DIAGNOSIS — G43009 Migraine without aura, not intractable, without status migrainosus: Secondary | ICD-10-CM | POA: Diagnosis not present

## 2019-03-27 DIAGNOSIS — G8929 Other chronic pain: Secondary | ICD-10-CM | POA: Diagnosis not present

## 2019-03-27 DIAGNOSIS — M810 Age-related osteoporosis without current pathological fracture: Secondary | ICD-10-CM | POA: Diagnosis not present

## 2019-03-27 DIAGNOSIS — G609 Hereditary and idiopathic neuropathy, unspecified: Secondary | ICD-10-CM | POA: Diagnosis not present

## 2019-03-27 DIAGNOSIS — D509 Iron deficiency anemia, unspecified: Secondary | ICD-10-CM | POA: Diagnosis not present

## 2019-04-01 DIAGNOSIS — E785 Hyperlipidemia, unspecified: Secondary | ICD-10-CM | POA: Diagnosis not present

## 2019-04-01 DIAGNOSIS — G4700 Insomnia, unspecified: Secondary | ICD-10-CM | POA: Diagnosis not present

## 2019-04-01 DIAGNOSIS — G8929 Other chronic pain: Secondary | ICD-10-CM | POA: Diagnosis not present

## 2019-04-01 DIAGNOSIS — M4800 Spinal stenosis, site unspecified: Secondary | ICD-10-CM | POA: Diagnosis not present

## 2019-04-01 DIAGNOSIS — F0390 Unspecified dementia without behavioral disturbance: Secondary | ICD-10-CM | POA: Diagnosis not present

## 2019-04-01 DIAGNOSIS — M5412 Radiculopathy, cervical region: Secondary | ICD-10-CM | POA: Diagnosis not present

## 2019-04-01 DIAGNOSIS — C4922 Malignant neoplasm of connective and soft tissue of left lower limb, including hip: Secondary | ICD-10-CM | POA: Diagnosis not present

## 2019-04-01 DIAGNOSIS — Z87820 Personal history of traumatic brain injury: Secondary | ICD-10-CM | POA: Diagnosis not present

## 2019-04-01 DIAGNOSIS — K5909 Other constipation: Secondary | ICD-10-CM | POA: Diagnosis not present

## 2019-04-01 DIAGNOSIS — D509 Iron deficiency anemia, unspecified: Secondary | ICD-10-CM | POA: Diagnosis not present

## 2019-04-01 DIAGNOSIS — G43009 Migraine without aura, not intractable, without status migrainosus: Secondary | ICD-10-CM | POA: Diagnosis not present

## 2019-04-01 DIAGNOSIS — G609 Hereditary and idiopathic neuropathy, unspecified: Secondary | ICD-10-CM | POA: Diagnosis not present

## 2019-04-01 DIAGNOSIS — M170 Bilateral primary osteoarthritis of knee: Secondary | ICD-10-CM | POA: Diagnosis not present

## 2019-04-01 DIAGNOSIS — M810 Age-related osteoporosis without current pathological fracture: Secondary | ICD-10-CM | POA: Diagnosis not present

## 2019-04-01 DIAGNOSIS — D631 Anemia in chronic kidney disease: Secondary | ICD-10-CM | POA: Diagnosis not present

## 2019-04-01 DIAGNOSIS — I129 Hypertensive chronic kidney disease with stage 1 through stage 4 chronic kidney disease, or unspecified chronic kidney disease: Secondary | ICD-10-CM | POA: Diagnosis not present

## 2019-04-01 DIAGNOSIS — M5416 Radiculopathy, lumbar region: Secondary | ICD-10-CM | POA: Diagnosis not present

## 2019-04-01 DIAGNOSIS — Z8673 Personal history of transient ischemic attack (TIA), and cerebral infarction without residual deficits: Secondary | ICD-10-CM | POA: Diagnosis not present

## 2019-04-01 DIAGNOSIS — N183 Chronic kidney disease, stage 3 unspecified: Secondary | ICD-10-CM | POA: Diagnosis not present

## 2019-04-01 DIAGNOSIS — I872 Venous insufficiency (chronic) (peripheral): Secondary | ICD-10-CM | POA: Diagnosis not present

## 2019-04-01 DIAGNOSIS — F329 Major depressive disorder, single episode, unspecified: Secondary | ICD-10-CM | POA: Diagnosis not present

## 2019-04-01 DIAGNOSIS — D630 Anemia in neoplastic disease: Secondary | ICD-10-CM | POA: Diagnosis not present

## 2019-04-01 DIAGNOSIS — F411 Generalized anxiety disorder: Secondary | ICD-10-CM | POA: Diagnosis not present

## 2019-04-09 ENCOUNTER — Ambulatory Visit: Payer: PPO | Admitting: Podiatry

## 2019-05-01 ENCOUNTER — Telehealth: Payer: Self-pay

## 2019-05-01 NOTE — Telephone Encounter (Signed)
New message    The patient son called  Stated his mother might of have had a mini CVA, she is unable to walk.   The son asked should he take his mother to the emergency room.   I advise the patient son to call 911 or take her to the emergency room do not hesitate to wait.   Son verbalized understanding.

## 2019-05-01 NOTE — Telephone Encounter (Signed)
Noted../lmb 

## 2019-05-10 DIAGNOSIS — M2041 Other hammer toe(s) (acquired), right foot: Secondary | ICD-10-CM | POA: Diagnosis not present

## 2019-05-10 DIAGNOSIS — B351 Tinea unguium: Secondary | ICD-10-CM | POA: Diagnosis not present

## 2019-05-10 DIAGNOSIS — Z7401 Bed confinement status: Secondary | ICD-10-CM | POA: Diagnosis not present

## 2019-05-10 DIAGNOSIS — L84 Corns and callosities: Secondary | ICD-10-CM | POA: Diagnosis not present

## 2019-05-10 DIAGNOSIS — I739 Peripheral vascular disease, unspecified: Secondary | ICD-10-CM | POA: Diagnosis not present

## 2019-05-15 DIAGNOSIS — M6281 Muscle weakness (generalized): Secondary | ICD-10-CM | POA: Diagnosis not present

## 2019-05-15 DIAGNOSIS — F419 Anxiety disorder, unspecified: Secondary | ICD-10-CM | POA: Diagnosis not present

## 2019-05-15 DIAGNOSIS — F0390 Unspecified dementia without behavioral disturbance: Secondary | ICD-10-CM | POA: Diagnosis not present

## 2019-05-15 DIAGNOSIS — Z0000 Encounter for general adult medical examination without abnormal findings: Secondary | ICD-10-CM | POA: Diagnosis not present

## 2019-05-15 DIAGNOSIS — I10 Essential (primary) hypertension: Secondary | ICD-10-CM | POA: Diagnosis not present

## 2019-05-15 DIAGNOSIS — Z7189 Other specified counseling: Secondary | ICD-10-CM | POA: Diagnosis not present

## 2019-06-08 DIAGNOSIS — I10 Essential (primary) hypertension: Secondary | ICD-10-CM | POA: Diagnosis not present

## 2019-06-08 DIAGNOSIS — F419 Anxiety disorder, unspecified: Secondary | ICD-10-CM | POA: Diagnosis not present

## 2019-07-11 ENCOUNTER — Inpatient Hospital Stay (HOSPITAL_COMMUNITY)
Admission: EM | Admit: 2019-07-11 | Discharge: 2019-07-16 | DRG: 389 | Disposition: A | Discharge status: Home / Self Care | Payer: PPO | Attending: Family Medicine | Admitting: Family Medicine

## 2019-07-11 ENCOUNTER — Encounter (HOSPITAL_COMMUNITY): Payer: Self-pay | Admitting: Family Medicine

## 2019-07-11 ENCOUNTER — Observation Stay (HOSPITAL_COMMUNITY): Payer: PPO

## 2019-07-11 ENCOUNTER — Emergency Department (HOSPITAL_COMMUNITY): Payer: PPO

## 2019-07-11 ENCOUNTER — Other Ambulatory Visit: Payer: Self-pay

## 2019-07-11 DIAGNOSIS — M8580 Other specified disorders of bone density and structure, unspecified site: Secondary | ICD-10-CM | POA: Diagnosis not present

## 2019-07-11 DIAGNOSIS — I10 Essential (primary) hypertension: Secondary | ICD-10-CM | POA: Diagnosis present

## 2019-07-11 DIAGNOSIS — Z809 Family history of malignant neoplasm, unspecified: Secondary | ICD-10-CM | POA: Diagnosis not present

## 2019-07-11 DIAGNOSIS — I7090 Unspecified atherosclerosis: Secondary | ICD-10-CM | POA: Diagnosis not present

## 2019-07-11 DIAGNOSIS — I6782 Cerebral ischemia: Secondary | ICD-10-CM | POA: Diagnosis not present

## 2019-07-11 DIAGNOSIS — K5909 Other constipation: Secondary | ICD-10-CM | POA: Diagnosis not present

## 2019-07-11 DIAGNOSIS — C499 Malignant neoplasm of connective and soft tissue, unspecified: Secondary | ICD-10-CM | POA: Diagnosis not present

## 2019-07-11 DIAGNOSIS — K920 Hematemesis: Secondary | ICD-10-CM | POA: Diagnosis present

## 2019-07-11 DIAGNOSIS — M170 Bilateral primary osteoarthritis of knee: Secondary | ICD-10-CM | POA: Diagnosis present

## 2019-07-11 DIAGNOSIS — D62 Acute posthemorrhagic anemia: Secondary | ICD-10-CM

## 2019-07-11 DIAGNOSIS — Z03818 Encounter for observation for suspected exposure to other biological agents ruled out: Secondary | ICD-10-CM | POA: Diagnosis not present

## 2019-07-11 DIAGNOSIS — F0390 Unspecified dementia without behavioral disturbance: Secondary | ICD-10-CM | POA: Diagnosis not present

## 2019-07-11 DIAGNOSIS — Z7401 Bed confinement status: Secondary | ICD-10-CM

## 2019-07-11 DIAGNOSIS — C4922 Malignant neoplasm of connective and soft tissue of left lower limb, including hip: Secondary | ICD-10-CM | POA: Diagnosis present

## 2019-07-11 DIAGNOSIS — R1310 Dysphagia, unspecified: Secondary | ICD-10-CM | POA: Diagnosis present

## 2019-07-11 DIAGNOSIS — Z8249 Family history of ischemic heart disease and other diseases of the circulatory system: Secondary | ICD-10-CM

## 2019-07-11 DIAGNOSIS — E785 Hyperlipidemia, unspecified: Secondary | ICD-10-CM | POA: Diagnosis present

## 2019-07-11 DIAGNOSIS — I361 Nonrheumatic tricuspid (valve) insufficiency: Secondary | ICD-10-CM | POA: Diagnosis not present

## 2019-07-11 DIAGNOSIS — I959 Hypotension, unspecified: Secondary | ICD-10-CM | POA: Diagnosis not present

## 2019-07-11 DIAGNOSIS — R000 Tachycardia, unspecified: Secondary | ICD-10-CM | POA: Diagnosis not present

## 2019-07-11 DIAGNOSIS — Z833 Family history of diabetes mellitus: Secondary | ICD-10-CM | POA: Diagnosis not present

## 2019-07-11 DIAGNOSIS — R404 Transient alteration of awareness: Secondary | ICD-10-CM | POA: Diagnosis not present

## 2019-07-11 DIAGNOSIS — R55 Syncope and collapse: Secondary | ICD-10-CM | POA: Diagnosis not present

## 2019-07-11 DIAGNOSIS — Z20822 Contact with and (suspected) exposure to covid-19: Secondary | ICD-10-CM | POA: Diagnosis not present

## 2019-07-11 DIAGNOSIS — Z993 Dependence on wheelchair: Secondary | ICD-10-CM

## 2019-07-11 DIAGNOSIS — K567 Ileus, unspecified: Principal | ICD-10-CM

## 2019-07-11 DIAGNOSIS — M810 Age-related osteoporosis without current pathological fracture: Secondary | ICD-10-CM | POA: Diagnosis present

## 2019-07-11 DIAGNOSIS — R140 Abdominal distension (gaseous): Secondary | ICD-10-CM | POA: Diagnosis not present

## 2019-07-11 DIAGNOSIS — R1110 Vomiting, unspecified: Secondary | ICD-10-CM | POA: Diagnosis not present

## 2019-07-11 DIAGNOSIS — E7800 Pure hypercholesterolemia, unspecified: Secondary | ICD-10-CM | POA: Diagnosis present

## 2019-07-11 DIAGNOSIS — S2231XA Fracture of one rib, right side, initial encounter for closed fracture: Secondary | ICD-10-CM | POA: Diagnosis not present

## 2019-07-11 DIAGNOSIS — K922 Gastrointestinal hemorrhage, unspecified: Secondary | ICD-10-CM | POA: Diagnosis not present

## 2019-07-11 DIAGNOSIS — Z66 Do not resuscitate: Secondary | ICD-10-CM | POA: Diagnosis not present

## 2019-07-11 DIAGNOSIS — R911 Solitary pulmonary nodule: Secondary | ICD-10-CM | POA: Diagnosis not present

## 2019-07-11 DIAGNOSIS — C4920 Malignant neoplasm of connective and soft tissue of unspecified lower limb, including hip: Secondary | ICD-10-CM | POA: Diagnosis present

## 2019-07-11 DIAGNOSIS — M2550 Pain in unspecified joint: Secondary | ICD-10-CM | POA: Diagnosis not present

## 2019-07-11 DIAGNOSIS — R0902 Hypoxemia: Secondary | ICD-10-CM | POA: Diagnosis not present

## 2019-07-11 DIAGNOSIS — R4182 Altered mental status, unspecified: Secondary | ICD-10-CM | POA: Diagnosis present

## 2019-07-11 DIAGNOSIS — R5381 Other malaise: Secondary | ICD-10-CM | POA: Diagnosis not present

## 2019-07-11 DIAGNOSIS — R410 Disorientation, unspecified: Secondary | ICD-10-CM | POA: Diagnosis not present

## 2019-07-11 HISTORY — DX: Unspecified dementia, unspecified severity, without behavioral disturbance, psychotic disturbance, mood disturbance, and anxiety: F03.90

## 2019-07-11 LAB — CBC WITH DIFFERENTIAL/PLATELET
Abs Immature Granulocytes: 0.02 10*3/uL (ref 0.00–0.07)
Basophils Absolute: 0 10*3/uL (ref 0.0–0.1)
Basophils Relative: 0 %
Eosinophils Absolute: 0 10*3/uL (ref 0.0–0.5)
Eosinophils Relative: 0 %
HCT: 30 % — ABNORMAL LOW (ref 36.0–46.0)
Hemoglobin: 9.2 g/dL — ABNORMAL LOW (ref 12.0–15.0)
Immature Granulocytes: 0 %
Lymphocytes Relative: 14 %
Lymphs Abs: 1 10*3/uL (ref 0.7–4.0)
MCH: 28.5 pg (ref 26.0–34.0)
MCHC: 30.7 g/dL (ref 30.0–36.0)
MCV: 92.9 fL (ref 80.0–100.0)
Monocytes Absolute: 0.4 10*3/uL (ref 0.1–1.0)
Monocytes Relative: 5 %
Neutro Abs: 6.1 10*3/uL (ref 1.7–7.7)
Neutrophils Relative %: 81 %
Platelets: 286 10*3/uL (ref 150–400)
RBC: 3.23 MIL/uL — ABNORMAL LOW (ref 3.87–5.11)
RDW: 15.9 % — ABNORMAL HIGH (ref 11.5–15.5)
WBC: 7.6 10*3/uL (ref 4.0–10.5)
nRBC: 0 % (ref 0.0–0.2)

## 2019-07-11 LAB — COMPREHENSIVE METABOLIC PANEL
ALT: 20 U/L (ref 0–44)
AST: 32 U/L (ref 15–41)
Albumin: 3.4 g/dL — ABNORMAL LOW (ref 3.5–5.0)
Alkaline Phosphatase: 76 U/L (ref 38–126)
Anion gap: 10 (ref 5–15)
BUN: 20 mg/dL (ref 8–23)
CO2: 27 mmol/L (ref 22–32)
Calcium: 9.2 mg/dL (ref 8.9–10.3)
Chloride: 107 mmol/L (ref 98–111)
Creatinine, Ser: 1.16 mg/dL — ABNORMAL HIGH (ref 0.44–1.00)
GFR calc Af Amer: 48 mL/min — ABNORMAL LOW (ref >60)
GFR calc non Af Amer: 41 mL/min — ABNORMAL LOW (ref >60)
Glucose, Bld: 100 mg/dL — ABNORMAL HIGH (ref 70–99)
Potassium: 3.4 mmol/L — ABNORMAL LOW (ref 3.5–5.1)
Sodium: 144 mmol/L (ref 135–145)
Total Bilirubin: 0.6 mg/dL (ref 0.3–1.2)
Total Protein: 6.5 g/dL (ref 6.5–8.1)

## 2019-07-11 LAB — CBC
HCT: 29.2 % — ABNORMAL LOW (ref 36.0–46.0)
Hemoglobin: 9.1 g/dL — ABNORMAL LOW (ref 12.0–15.0)
MCH: 28.9 pg (ref 26.0–34.0)
MCHC: 31.2 g/dL (ref 30.0–36.0)
MCV: 92.7 fL (ref 80.0–100.0)
Platelets: 321 10*3/uL (ref 150–400)
RBC: 3.15 MIL/uL — ABNORMAL LOW (ref 3.87–5.11)
RDW: 16 % — ABNORMAL HIGH (ref 11.5–15.5)
WBC: 10 10*3/uL (ref 4.0–10.5)
nRBC: 0 % (ref 0.0–0.2)

## 2019-07-11 LAB — URINALYSIS, ROUTINE W REFLEX MICROSCOPIC
Bilirubin Urine: NEGATIVE
Glucose, UA: NEGATIVE mg/dL
Hgb urine dipstick: NEGATIVE
Ketones, ur: NEGATIVE mg/dL
Leukocytes,Ua: NEGATIVE
Nitrite: NEGATIVE
Protein, ur: NEGATIVE mg/dL
Specific Gravity, Urine: 1.017 (ref 1.005–1.030)
pH: 5 (ref 5.0–8.0)

## 2019-07-11 LAB — SARS CORONAVIRUS 2 BY RT PCR (HOSPITAL ORDER, PERFORMED IN ~~LOC~~ HOSPITAL LAB): SARS Coronavirus 2: NEGATIVE

## 2019-07-11 LAB — MAGNESIUM: Magnesium: 1.9 mg/dL (ref 1.7–2.4)

## 2019-07-11 MED ORDER — SODIUM CHLORIDE 0.9 % IV BOLUS
500.0000 mL | Freq: Once | INTRAVENOUS | Status: AC
  Administered 2019-07-11: 500 mL via INTRAVENOUS

## 2019-07-11 MED ORDER — POTASSIUM CHLORIDE 10 MEQ/100ML IV SOLN
10.0000 meq | INTRAVENOUS | Status: AC
  Administered 2019-07-11 – 2019-07-12 (×4): 10 meq via INTRAVENOUS
  Filled 2019-07-11 (×4): qty 100

## 2019-07-11 MED ORDER — QUETIAPINE FUMARATE 25 MG PO TABS
50.0000 mg | ORAL_TABLET | Freq: Every day | ORAL | Status: DC
  Administered 2019-07-12 – 2019-07-15 (×3): 50 mg via ORAL
  Filled 2019-07-11 (×5): qty 2

## 2019-07-11 MED ORDER — SODIUM CHLORIDE 0.9 % IV SOLN: 250.0000 mL | INTRAVENOUS | Status: DC | PRN

## 2019-07-11 MED ORDER — SODIUM CHLORIDE 0.9% FLUSH
3.0000 mL | Freq: Two times a day (BID) | INTRAVENOUS | Status: DC
  Administered 2019-07-13 – 2019-07-14 (×2): 3 mL via INTRAVENOUS

## 2019-07-11 MED ORDER — SODIUM CHLORIDE 0.9% FLUSH: 3.0000 mL | INTRAVENOUS | Status: DC | PRN

## 2019-07-11 MED ORDER — METOPROLOL TARTRATE 5 MG/5ML IV SOLN
5.0000 mg | INTRAVENOUS | Status: AC | PRN
  Administered 2019-07-11: 5 mg via INTRAVENOUS
  Filled 2019-07-11 (×2): qty 5

## 2019-07-11 MED ORDER — ENOXAPARIN SODIUM 30 MG/0.3ML ~~LOC~~ SOLN: 30.0000 mg | SUBCUTANEOUS | Status: DC

## 2019-07-11 MED ORDER — ACETAMINOPHEN 650 MG RE SUPP: 650.0000 mg | Freq: Four times a day (QID) | RECTAL | Status: DC | PRN

## 2019-07-11 MED ORDER — ACETAMINOPHEN 325 MG PO TABS
650.0000 mg | ORAL_TABLET | Freq: Four times a day (QID) | ORAL | Status: DC | PRN
  Administered 2019-07-14: 650 mg via ORAL
  Filled 2019-07-11: qty 2

## 2019-07-11 MED ORDER — SODIUM CHLORIDE 0.9% FLUSH
3.0000 mL | Freq: Two times a day (BID) | INTRAVENOUS | Status: DC
  Administered 2019-07-11 – 2019-07-14 (×4): 3 mL via INTRAVENOUS

## 2019-07-11 MED ORDER — SODIUM CHLORIDE 0.9 % IV SOLN: INTRAVENOUS | Status: DC

## 2019-07-11 MED ORDER — AMLODIPINE BESYLATE 5 MG PO TABS
5.0000 mg | ORAL_TABLET | Freq: Every day | ORAL | Status: DC
  Administered 2019-07-14: 5 mg via ORAL
  Filled 2019-07-11 (×3): qty 1

## 2019-07-11 NOTE — ED Notes (Signed)
Pt with a copious amount of mucous that she keeps spitting up.

## 2019-07-11 NOTE — ED Triage Notes (Addendum)
Pt came via EMS for AMS after eating x2 days. Pt with hx of dementia.Son Newt Lukes) reports pt becoming more non-verbal and uncooperative.

## 2019-07-11 NOTE — ED Provider Notes (Signed)
[facility] DEPT Provider Note   CSN: [id number] Arrival date & time: 07/11/19  1009     History Chief Complaint  Patient presents with  . Altered Mental Status    Lynn Howell is a 86 y.o. female.  The history is provided by a relative and medical records. No language interpreter was used.   Lynn Howell is a 86 y.o. female who presents to the Emergency Department complaining of AMS. Level V caveat due to dementia. History is provided by the patient son. She presents the emergency department for evaluation of altered mental status that began this morning. She has dementia and is nonverbal and nonambulatory at baseline. She lives with her son. Over the last three mornings she has had episodes where she slumped over in the chair with some mild shaking. The episodes yesterday and the day before were very brief. This morning she had another episode and it lasted about seven minutes. She had a home health physician at the home at this time. EMS was called. Her son reports that her urine has been darker than usual lately. No additional symptoms.   Lanelle Bal physician [phone]     Past Medical History:  Diagnosis Date  . ANEMIA-IRON DEFICIENCY 08/01/2007  . ANKLE PAIN, LEFT 01/22/2007  . Arthritis   . BACK PAIN, CHRONIC, INTERMITTENT 02/10/2010  . CERUMEN IMPACTION, RIGHT 04/20/2007  . COLONIC POLYPS, HX OF 09/21/2006  . COMMON MIGRAINE 09/21/2006  . CONSTIPATION, CHRONIC 09/21/2006  . CONTACT DERMATITIS 08/01/2008  . DEGENERATIVE JOINT DISEASE, KNEES, BILATERAL 03/19/2010  . DEPRESSION 08/01/2007  . Hypercholesteremia   . HYPERLIPIDEMIA 08/01/2007  . Hypertension   . HYPERTENSION 09/21/2006  . KNEE PAIN, BILATERAL 09/15/2008  . Left cervical radiculopathy 03/07/2012  . LEG PAIN, RIGHT 03/03/2008  . 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 LUMBAR RADICULOPATHY, RIGHT 03/19/2010  . NEPHROLITHIASIS, HX OF 09/21/2006  . OSTEOPENIA 12/17/2008  .  OSTEOPOROSIS 01/22/2007  . SPINAL STENOSIS 12/17/2008  . Stroke ([place])   . Swelling of limb 03/19/2010  . URI 04/20/2007    Patient Active Problem List   Diagnosis Date Noted  . Insomnia 03/06/2019  . Subdural hematoma ([place]) 07/23/2018  . Dementia ([place]) 11/23/2017  . General weakness 11/22/2017  . Incontinence of urine 11/22/2017  . Bilateral knee pain 09/29/2017  . Gait disorder 09/14/2017  . Low back pain 09/14/2017  . Dizziness 03/08/2017  . Rash 09/07/2016  . Loose stools 03/13/2016  . Left foot pain 02/01/2014  . Bilateral leg pain 09/12/2013  . Peripheral edema 04/23/2012  . Left cervical radiculopathy 03/07/2012  . Preventative health care 07/15/2010  . 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 Osteoarthrosis involving lower leg 03/19/2010  . LUMBAR RADICULOPATHY, RIGHT 03/19/2010  . Backache 02/10/2010  . SPINAL STENOSIS 12/17/2008  . Hyperlipidemia 08/01/2007  . ANEMIA-IRON DEFICIENCY 08/01/2007  . Anxiety state 08/01/2007  . Depression 08/01/2007  . OSTEOPOROSIS 01/22/2007  . COMMON MIGRAINE 09/21/2006  . Essential hypertension 09/21/2006  . CONSTIPATION, CHRONIC 09/21/2006  . COLONIC POLYPS, HX OF 09/21/2006  . NEPHROLITHIASIS, HX OF 09/21/2006    Past Surgical History:  Procedure Laterality Date  . ABDOMINAL HYSTERECTOMY    . CHOLECYSTECTOMY    . TUBAL LIGATION       OB History   No obstetric history on file.     Family History  Problem Relation Age of Onset  . Cancer Mother   . Cancer Sister   . Hypertension Sister   .  Diabetes Brother   . Hypertension Brother     Social History   Tobacco Use  . Smoking status: Never Smoker  . Smokeless tobacco: Never Used  Vaping Use  . Vaping Use: Never used  Substance Use Topics  . Alcohol use: No  . Drug use: No    Home Medications Prior to Admission medications   Medication Sig Start Date End Date Taking? Authorizing Provider  amLODipine (NORVASC) 5 MG tablet Take 1 tablet (5 mg total)  by mouth daily. 03/06/19 03/05/20 Yes Biagio Borg, MD  irbesartan-hydrochlorothiazide (AVALIDE) 150-12.5 MG tablet TAKE ONE TABLET DAILY Patient taking differently: Take 1 tablet by mouth daily.  03/06/19  Yes Biagio Borg, MD  QUEtiapine (SEROQUEL) 50 MG tablet Take 1 tablet (50 mg total) by mouth at bedtime. 03/06/19  Yes Biagio Borg, MD    Allergies    Alendronate sodium, Hydrochlorothiazide w-triamterene, and Norco [hydrocodone-acetaminophen]  Review of Systems   Review of Systems  Unable to perform ROS: Dementia    Physical Exam Updated Vital Signs BP 131/79 (BP Location: Right Arm)   Pulse 91   Temp 97.8 F (36.6 C) (Axillary)   Resp 16   Ht 5\' 7"  (1.702 m)   Wt 49.9 kg   SpO2 100%   BMI 17.23 kg/m   Physical Exam Vitals and nursing note reviewed.  Constitutional:      Appearance: She is well-developed.  HENT:     Head: Normocephalic and atraumatic.  Cardiovascular:     Rate and Rhythm: Normal rate and regular rhythm.     Heart sounds: No murmur heard.   Pulmonary:     Effort: Pulmonary effort is normal. No respiratory distress.     Breath sounds: Normal breath sounds.  Abdominal:     Palpations: Abdomen is soft.     Tenderness: There is no abdominal tenderness. There is no guarding or rebound.  Musculoskeletal:        General: No swelling or tenderness.  Skin:    General: Skin is warm and dry.  Neurological:     Mental Status: She is alert.     Comments: Nonverbal. Does not follow commands. There is increased tone in all four extremities.  Psychiatric:     Comments: calm     ED Results / Procedures / Treatments   Labs (all labs ordered are listed, but only abnormal results are displayed) Labs Reviewed  COMPREHENSIVE METABOLIC PANEL - Abnormal; Notable for the following components:      Result Value   Potassium 3.4 (*)    Glucose, Bld 100 (*)    Creatinine, Ser 1.16 (*)    Albumin 3.4 (*)    GFR calc non Af Amer 41 (*)    GFR calc Af Amer 48  (*)    All other components within normal limits  CBC WITH DIFFERENTIAL/PLATELET - Abnormal; Notable for the following components:   RBC 3.23 (*)    Hemoglobin 9.2 (*)    HCT 30.0 (*)    RDW 15.9 (*)    All other components within normal limits  URINE CULTURE  URINALYSIS, ROUTINE W REFLEX MICROSCOPIC    EKG None  Radiology DG Chest 1 View  Result Date: 07/11/2019 CLINICAL DATA:  Altered mental status, history of dementia EXAM: CHEST  1 VIEW COMPARISON:  Chest x-ray from 11/16/2017 FINDINGS: Heart size mildly enlarged similar to previous imaging. Hilar structures are normal. Nodular opacities project over the LEFT mid chest measuring as large as  11 mm. There are signs of rib fractures with healing over the RIGHT chest involving ribs 4 through 8 posterolaterally. Nodular areas in the LEFT chest not clearly corresponding to anterior ribs though assessment on portable radiograph is limited. No signs of lobar consolidation. No sign of pleural effusion. Nodular areas in the LEFT chest not seen on previous shoulder radiographs from 2020. There is also evidence of deformity of the LEFT clavicle with healed appearance could not present on exam of 1 year ago. IMPRESSION: 1. No signs of lobar consolidation or pleural effusion. 2. Potential pulmonary nodules versus callus formation from rib fractures in the LEFT chest in this patient who exhibit signs of trauma to the LEFT clavicle and evidence of healing rib fractures along the RIGHT posterolateral chest. Chest CT may be helpful to exclude underlying pulmonary nodule in the LEFT chest. 3. Remote RIGHT rib fractures with healing over ribs 4 through 8 posterolaterally. Electronically Signed   By: Zetta Bills M.D.   On: 07/11/2019 11:50   CT Head Wo Contrast  Result Date: 07/11/2019 CLINICAL DATA:  Altered mental status, possible seizure EXAM: CT HEAD WITHOUT CONTRAST TECHNIQUE: Contiguous axial images were obtained from the base of the skull through the  vertex without intravenous contrast. COMPARISON:  07/24/2018 FINDINGS: Technical note: Examination is mildly degraded by motion artifact. Axial series was repeated with decreased motion artifact. Exam is of adequate diagnostic quality. Brain: No evidence of acute infarction, hemorrhage, hydrocephalus, extra-axial collection or mass lesion/mass effect. Extensive low-density changes within the periventricular and subcortical white matter compatible with chronic microvascular ischemic change. Moderate diffuse cerebral volume loss. Vascular: Atherosclerotic calcifications involving the large vessels of the skull base. No unexpected hyperdense vessel. Skull: Negative for acute calvarial fracture. Sinuses/Orbits: No acute finding. Other: None. IMPRESSION: 1. No acute intracranial findings. 2. Chronic microvascular ischemic change and cerebral volume loss. Electronically Signed   By: Davina Poke D.O.   On: 07/11/2019 11:36    Procedures Procedures (including critical care time)  Medications Ordered in ED Medications  sodium chloride 0.9 % bolus 500 mL (has no administration in time range)    ED Course  I have reviewed the triage vital signs and the nursing notes.  Pertinent labs & imaging results that were available during my care of the patient were reviewed by me and considered in my medical decision making (see chart for details).    MDM Rules/Calculators/A&P                         patient with history of dementia here for evaluation of syncopal episodes, recent one today sounded like a seizure event on description by son. Attempted to contact patient's physician, with information provided by her son but there was no answer. Imaging without any acute abnormality. Patient remains at her baseline during her ED stay. Given multiple episodes over the last few days recommend observation for further workup. Son updated findings of studies and he is in agreement with treatment plan. Hospitalist  consulted for admission.  Final Clinical Impression(s) / ED Diagnoses Final diagnoses:  None    Rx / DC Orders ED Discharge Orders    None       Quintella Reichert, MD 07/11/19 1500

## 2019-07-11 NOTE — ED Notes (Signed)
Attempted to call report. Per floor, they have 2 pts pended to that bed and the [facility] is sorting it out.

## 2019-07-11 NOTE — ED Notes (Signed)
MD notified about VS changes. See new orders

## 2019-07-11 NOTE — ED Notes (Signed)
Pt in x-ray will get vitals on pt return

## 2019-07-11 NOTE — H&P (Signed)
History and Physical  Shalissa Easterwood ASN:[id number] DOB: [birth year] DOA: 07/11/2019  PCP: Biagio Borg, MD   Chief Complaint: passed out  HPI:  39yow PMH advanced dementia presented from home with several episodes of unresponsiveness.  Initial evaluation was unrevealing, observation requested for further evaluation.  All history obtained from son/HCPOA at bedside.  I called the physician that makes house calls, no answer, [phone].  6/22 the patient had at period of unresponsiveness for several seconds while she was sitting down.  6/23 she had another episode of unresponsiveness which lasted for several seconds and spontaneously resolved, again while sitting down.  Today, the patient had eaten breakfast.  Physician making a house call was present when the patient, again sitting, had a period of unresponsiveness which lasted for about 7 minutes.  No apparent seizure activity.  No gross motor deficit noted.  Son does report some increased tremors lately.  Otherwise patient seems to be at baseline.  Patient has advanced dementia, is essentially nonverbal, is cared for by her 3 children at home.  She is incontinent.  Requires care with bathing and dressing.  Is able to feed self.  Eats a regular diet.  Has been nonverbal for several months.  Right flexion contracture lower leg chronic.  ED Course: given normal saline, screening w/u unremarkable.  CT head negative.  Review of Systems:  Unobtainable from patient.  Otherwise as above.  PMH . Advanced dementia, wheelchair-bound, able to eat on own, otherwise total assist . 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 Remainder reviewed in Epic  [place] . Hysterectomy . Cholecystectomy . Remainder reviewed in Epic  Family history includes: . Sister with hypertension . Remainder reviewed in [place] History . Non-smoker, nondrinker  Allergies . Unknown reaction to alendronate, hydrochlorothiazide  triamterene . Norco caused nausea  Meds include: Current Meds  Medication Sig  . amLODipine (NORVASC) 5 MG tablet Take 1 tablet (5 mg total) by mouth daily.  . irbesartan-hydrochlorothiazide (AVALIDE) 150-12.5 MG tablet TAKE ONE TABLET DAILY (Patient taking differently: Take 1 tablet by mouth daily. )  . QUEtiapine (SEROQUEL) 50 MG tablet Take 1 tablet (50 mg total) by mouth at bedtime.   Marland Kitchen   Physicial Exam   Vitals:  . 97.8, 19, 100, 137/73, 99% on room air  Constitutional:   . Appears calm, fidgets, takes off gown, follows with eyes Eyes:  Marland Kitchen Appear grossly unremarkable . Normal lids  ENMT:  . grossly normal hearing  . Lips appear normal Neck:  . Appears grossly unremarkable Respiratory:  . CTA bilaterally, no w/r/r.  . Respiratory effort normal.  Cardiovascular:  . RRR, no m/r/g . No LE extremity edema   Abdomen:  . Abdomen appears normal; no tenderness or masses . No hernias noted Musculoskeletal:  . RUE, LUE, RLE, LLE   o strength and tone grossly normal, no atrophy, no abnormal movements o Large left lateral thigh mass o Right knee contracture . gait and station not testable, patient is wheelchair-bound Skin:  . No rashes, lesions, ulcers . palpation of skin: no induration or nodules Neurologic:  . Grossly intact, see above Psychiatric:  . Mental status o Mood, affect not assessable o Orientation not assessable . judgment and insight impaired  I have personally reviewed following labs and imaging studies  Labs:  Marland Kitchen Potassium 3.4, remainder BMP unremarkable . Hepatic function panel unremarkable . Hemoglobin stable at 9.2 . Urinalysis negative  Imaging studies:   Chest x-ray no acute disease.  Healed rib fractures and left clavicle fracture.  Possible pulmonary nodules.  Medical tests:   EKG pending, not ordered in ED   ASSESSMENT/PLAN  Episodes of unresponsiveness.  All occurred while sitting, difficult to assess given patient's advanced  dementia.  While differential is broad, would favor vasovagal type phenomenon.  No new deficits, CT head negative, no evidence to suggest stroke.  Although seizure is considered, this is low on the differential, will monitor on telemetry but will not pursue EEG unless a definite event is captured during this hospitalization.  Orthostatics not testable given the patient's bedbound/wheelchair status. --Telemetry, 2D echocardiogram, monitor for definitive episode --No evidence of infection or stroke.  No further imaging recommended at this time. --Electrolytes unremarkable and CBC unremarkable  Potential pulmonary nodules on the left versus callus formation from rib fractures. --Given patient's advanced dementia, as well as approach to liposarcoma, no further evaluation suggested.  Advanced dementia cared for at home --Appears to be at baseline.  Plan is to return home with family.  Liposarcoma left thigh diagnosed 2012. --Last seen by oncology 2019 with plan for no further evaluation or treatment.  Slow-growing tumor.  DVT prophylaxis: enoxaparin Code Status: DNR per son Family Communication: son/HCPOA at bedside Consults called: none    Time spent: 60 minutes  Murray Hodgkins, MD  [facility] Direct contact: see [email or web address]  7PM-7AM contact night coverage as below   1. Check the care team in [facility] and look for a) attending/consulting TRH provider listed and b) the [facility] team listed 2. Log into [email or web address] and use [facility]'s universal password to access. If you do not have the password, please contact the hospital operator. 3. Locate the [facility] provider you are looking for under [facility] and page to a number that you can be directly reached. 4. If you still have difficulty reaching the provider, please page the [facility] (Director on Call) for the Hospitalists listed on amion for assistance.  Severity of Illness: The appropriate patient status for this patient is OBSERVATION.  Observation status is judged to be reasonable and necessary in order to provide the required intensity of service to ensure the patient's safety. The patient's presenting symptoms, physical exam findings, and initial radiographic and laboratory data in the context of their medical condition is felt to place them at decreased risk for further clinical deterioration. Furthermore, it is anticipated that the patient will be medically stable for discharge from the hospital within 2 midnights of admission. The following factors support the patient status of observation.   " The patient's presenting symptoms include episodes of unresponsiveness. " The physical exam findings include confusion, right lower extremity contracture, left thigh mass. " The initial radiographic and laboratory data are reassuring.    Status is: Observation  The patient remains OBS appropriate and will d/c before 2 midnights.  Dispo: The patient is from: Home              Anticipated d/c is to: Home              Anticipated d/c date is: 1 day              Patient currently is not medically stable to d/c. 07/11/2019, 4:35 PM   Principal Problem:   Syncope Active Problems:   Liposarcoma of lower extremity ([place])   Dementia ([place])

## 2019-07-11 NOTE — ED Notes (Signed)
MD at bedside. 

## 2019-07-11 NOTE — Progress Notes (Addendum)
CTSP for tachycardia  Patient nonverbal.  Per RN suddenly became tachycardic.  Has had some dark emesis suctioned.  Also dark almost tarry emesis.  Then vomited.  Earlier in the day in the emergency department patient had a normal Iafrate stool.  Per son at bedside no history of bleeding. No coughing at home.  Objective Tachycardic, lots of artifact on telemetry, however appears to be sinus tachycardia SPO2 100% on room air SBP 130s Afebrile  On exam appears calm, comfortable, unchanged.  Confused. Heart tachycardic Abdomen.  Soft, nondistended.  Nontender.  EKG poor quality with artifact, sinus tachycardia with possible competing afib with rate-related changes. D/w Dr. Meda Coffee cardiology; no MI.  A/P ST, coffee ground emesis. Concern for obstruction or GIB. --NPO --STAT CBC, AXR --IVF --I discussed with M. Denny night coverage for close f/u of studies and further management.  Discussed with son [facility]) at bedside. Concern for serious developing illness. Would recommend conservative approach. Son's number [phone]  Murray Hodgkins, MD

## 2019-07-11 NOTE — ED Notes (Signed)
Pt had vomiting episode of coffee ground emesis. MD notified. Pt cleaned and bed changed.

## 2019-07-12 ENCOUNTER — Observation Stay (HOSPITAL_COMMUNITY): Payer: PPO

## 2019-07-12 DIAGNOSIS — Z809 Family history of malignant neoplasm, unspecified: Secondary | ICD-10-CM | POA: Diagnosis not present

## 2019-07-12 DIAGNOSIS — E7800 Pure hypercholesterolemia, unspecified: Secondary | ICD-10-CM | POA: Diagnosis present

## 2019-07-12 DIAGNOSIS — K922 Gastrointestinal hemorrhage, unspecified: Secondary | ICD-10-CM | POA: Diagnosis not present

## 2019-07-12 DIAGNOSIS — K567 Ileus, unspecified: Secondary | ICD-10-CM | POA: Diagnosis present

## 2019-07-12 DIAGNOSIS — Z7401 Bed confinement status: Secondary | ICD-10-CM | POA: Diagnosis not present

## 2019-07-12 DIAGNOSIS — Z833 Family history of diabetes mellitus: Secondary | ICD-10-CM | POA: Diagnosis not present

## 2019-07-12 DIAGNOSIS — D62 Acute posthemorrhagic anemia: Secondary | ICD-10-CM | POA: Diagnosis present

## 2019-07-12 DIAGNOSIS — Z993 Dependence on wheelchair: Secondary | ICD-10-CM | POA: Diagnosis not present

## 2019-07-12 DIAGNOSIS — F0390 Unspecified dementia without behavioral disturbance: Secondary | ICD-10-CM | POA: Diagnosis present

## 2019-07-12 DIAGNOSIS — Z20822 Contact with and (suspected) exposure to covid-19: Secondary | ICD-10-CM | POA: Diagnosis present

## 2019-07-12 DIAGNOSIS — Z66 Do not resuscitate: Secondary | ICD-10-CM | POA: Diagnosis present

## 2019-07-12 DIAGNOSIS — M8580 Other specified disorders of bone density and structure, unspecified site: Secondary | ICD-10-CM | POA: Diagnosis present

## 2019-07-12 DIAGNOSIS — R1110 Vomiting, unspecified: Secondary | ICD-10-CM

## 2019-07-12 DIAGNOSIS — I10 Essential (primary) hypertension: Secondary | ICD-10-CM | POA: Diagnosis present

## 2019-07-12 DIAGNOSIS — M170 Bilateral primary osteoarthritis of knee: Secondary | ICD-10-CM | POA: Diagnosis present

## 2019-07-12 DIAGNOSIS — I361 Nonrheumatic tricuspid (valve) insufficiency: Secondary | ICD-10-CM | POA: Diagnosis not present

## 2019-07-12 DIAGNOSIS — R4182 Altered mental status, unspecified: Secondary | ICD-10-CM | POA: Diagnosis present

## 2019-07-12 DIAGNOSIS — M810 Age-related osteoporosis without current pathological fracture: Secondary | ICD-10-CM | POA: Diagnosis present

## 2019-07-12 DIAGNOSIS — K920 Hematemesis: Secondary | ICD-10-CM | POA: Diagnosis present

## 2019-07-12 DIAGNOSIS — R1310 Dysphagia, unspecified: Secondary | ICD-10-CM | POA: Diagnosis present

## 2019-07-12 DIAGNOSIS — C4922 Malignant neoplasm of connective and soft tissue of left lower limb, including hip: Secondary | ICD-10-CM | POA: Diagnosis present

## 2019-07-12 DIAGNOSIS — E785 Hyperlipidemia, unspecified: Secondary | ICD-10-CM | POA: Diagnosis present

## 2019-07-12 DIAGNOSIS — Z8249 Family history of ischemic heart disease and other diseases of the circulatory system: Secondary | ICD-10-CM | POA: Diagnosis not present

## 2019-07-12 LAB — CBC
HCT: 25.8 % — ABNORMAL LOW (ref 36.0–46.0)
HCT: 26 % — ABNORMAL LOW (ref 36.0–46.0)
Hemoglobin: 8 g/dL — ABNORMAL LOW (ref 12.0–15.0)
Hemoglobin: 8.1 g/dL — ABNORMAL LOW (ref 12.0–15.0)
MCH: 28.3 pg (ref 26.0–34.0)
MCH: 28.4 pg (ref 26.0–34.0)
MCHC: 31 g/dL (ref 30.0–36.0)
MCHC: 31.2 g/dL (ref 30.0–36.0)
MCV: 91.2 fL (ref 80.0–100.0)
MCV: 91.2 fL (ref 80.0–100.0)
Platelets: 257 10*3/uL (ref 150–400)
Platelets: 276 10*3/uL (ref 150–400)
RBC: 2.83 MIL/uL — ABNORMAL LOW (ref 3.87–5.11)
RBC: 2.85 MIL/uL — ABNORMAL LOW (ref 3.87–5.11)
RDW: 16 % — ABNORMAL HIGH (ref 11.5–15.5)
RDW: 16 % — ABNORMAL HIGH (ref 11.5–15.5)
WBC: 9.2 10*3/uL (ref 4.0–10.5)
WBC: 8.1 10*3/uL (ref 4.0–10.5)
nRBC: 0 % (ref 0.0–0.2)
nRBC: 0 % (ref 0.0–0.2)

## 2019-07-12 LAB — ECHOCARDIOGRAM COMPLETE
Height: 67 in
Weight: 1760 oz

## 2019-07-12 LAB — BASIC METABOLIC PANEL
Anion gap: 10 (ref 5–15)
BUN: 42 mg/dL — ABNORMAL HIGH (ref 8–23)
CO2: 23 mmol/L (ref 22–32)
Calcium: 8.3 mg/dL — ABNORMAL LOW (ref 8.9–10.3)
Chloride: 109 mmol/L (ref 98–111)
Creatinine, Ser: 1.09 mg/dL — ABNORMAL HIGH (ref 0.44–1.00)
GFR calc Af Amer: 51 mL/min — ABNORMAL LOW (ref >60)
GFR calc non Af Amer: 44 mL/min — ABNORMAL LOW (ref >60)
Glucose, Bld: 119 mg/dL — ABNORMAL HIGH (ref 70–99)
Potassium: 3.7 mmol/L (ref 3.5–5.1)
Sodium: 142 mmol/L (ref 135–145)

## 2019-07-12 LAB — MAGNESIUM: Magnesium: 1.7 mg/dL (ref 1.7–2.4)

## 2019-07-12 LAB — PHOSPHORUS: Phosphorus: 2.5 mg/dL (ref 2.5–4.6)

## 2019-07-12 MED ORDER — MAGNESIUM SULFATE 2 GM/50ML IV SOLN
2.0000 g | Freq: Once | INTRAVENOUS | Status: AC
  Administered 2019-07-12: 2 g via INTRAVENOUS
  Filled 2019-07-12: qty 50

## 2019-07-12 MED ORDER — METOPROLOL TARTRATE 5 MG/5ML IV SOLN
5.0000 mg | INTRAVENOUS | Status: DC | PRN
  Administered 2019-07-12: 5 mg via INTRAVENOUS

## 2019-07-12 MED ORDER — POTASSIUM CHLORIDE 10 MEQ/100ML IV SOLN
10.0000 meq | INTRAVENOUS | Status: AC
  Administered 2019-07-12 (×4): 10 meq via INTRAVENOUS
  Filled 2019-07-12 (×4): qty 100

## 2019-07-12 NOTE — Progress Notes (Signed)
PROGRESS NOTE  Lynn Howell WEX:[id number] DOB: 21-Sep-1925 DOA: 07/11/2019 PCP: Biagio Borg, MD  Brief History   76yow PMH advanced dementia presented from home with several episodes of unresponsiveness.  Initial evaluation was unrevealing, observation requested for further evaluation.  Subsequently developed tachycardia with narrow complex, dark emesis.  A & P  Vomiting, colonic ileus.  No vomiting since placement in inpatient bed.   --Canister from suction shows bile.  Abdominal exam benign.  No evidence of ongoing bleeding. --No history obtainable. --Repeat electrolytes  ABLA vs anemia of chronic disease with dilution w/ IVF.  Episodes of unresponsiveness.  All occurred while sitting, difficult to assess given patient's advanced dementia.  While differential is broad, would favor vasovagal type phenomenon.  No new deficits, CT head negative, no evidence to suggest stroke.  Although seizure is considered, this is low on the differential, will monitor on telemetry but will not pursue EEG unless a definite event is captured during this hospitalization.  Orthostatics not testable given the patient's bedbound/wheelchair status. --Telemetry shows sinus rhythm with episodes of narrow complex tachycardia and one episode of SVT, likely secondary to acute illness rather than primary issue. --Follow-up 2D echocardiogram. --No evidence of infection, stroke or seizure., 2D echocardiogram, monitor for definitive episode  Potential pulmonary nodules on the left versus callus formation from rib fractures. --Given patient's advanced dementia, as well as approach to liposarcoma, no further evaluation suggested.  Advanced dementia cared for at home --Appears to be at baseline.  Plan is to return home with family.  Liposarcoma left thigh diagnosed 2012. --Last seen by oncology 2019 with plan for no further evaluation or treatment.  Slow-growing tumor.  Disposition Plan:  Discussion: Patient's  condition is guarded, potential for decline and even death, suspicion for intra-abdominal process, hopefully just ileus.  Differential includes gastrointestinal bleeding, hematemesis, peptic ulcer disease, undiagnosed intra-abdominal process.  Discussed in detail with son various treatment and investigation options including GI intervention, potential endoscopy, NG tube, CT scan.  Given advanced dementia, inability to cooperate with treatment such as NG tube, lack of current vomiting, advanced age, melena liposarcoma left thigh, possible pulmonary nodules, recommend conservative management, repeat plain film x-ray, trend CBC and monitor.  Recommend against escalation of care.  Healthcare power of attorney/son concurs with recommended plan and conservative management.  Status is: Observation, given her clinical condition change to inpatient  The patient will require care spanning > 2 midnights and should be moved to inpatient because: Inpatient level of care appropriate due to severity of illness  Dispo: The patient is from: Home              Anticipated d/c is to: Home              Anticipated d/c date is: 2 days              Patient currently is not medically stable to d/c.  DVT prophylaxis:  Place and maintain sequential compression device Start: 07/12/19 1036   Code Status: DNR Family Communication: son, see above  Murray Hodgkins, MD  [facility] Direct contact: see [email or web address] (further directions at bottom of note if needed) 7PM-7AM contact night coverage as at bottom of note 07/12/2019, 10:39 AM  LOS: 0 days   Significant Hospital Events   . 6/24 admitted for syncope, then developed tachycardia and vomiting, ileus.   Consults:  .    Procedures:  .   Significant Diagnostic Tests:  Marland Kitchen    Micro Data:  .  Antimicrobials:  .   Interval History/Subjective  Patient with advanced dementia and not able to provide any history at baseline.  Discussed with nursing, no  vomiting since arrival to inpatient bed.  No new issues or concerns.  Discussed with Ardith Dark from overnight.  X-ray was interpreted as ileus.  No further vomiting so NG tube was not placed.  Abdominal exam overnight benign.  Objective   Vitals:  Vitals:   07/12/19 0600 07/12/19 0959  BP: (!) 109/95 112/74  Pulse: (!) 102 (!) 101  Resp: 18 20  Temp: 98 F (36.7 C) 97.9 F (36.6 C)  SpO2: 100%     Exam:  Constitutional.  Appears calm and comfortable staring at the TV.  Fidgets a little bit with apron. Psychiatric.  Mood and affect not assessable.  Advanced dementia.  Nonverbal at baseline. ENT.  Keeps mouth shut, will not open lips or allow suctioning.  Does seem to have some phlegm and clear throat frequently. Respiratory.  Clear to auscultation bilaterally.  No wheezes, rales or rhonchi.  Normal respiratory effort. Cardiovascular.  Regular rate and rhythm no murmur, rub or gallop.  No lower extremity edema.  Telemetry sinus rhythm.  Few episodes of narrow complex tachycardia, one of them it does appear to be SVT. Abdomen is thin, mildly firm but nontender, nondistended.  No guarding noted. Skin.  Appears grossly unremarkable.  I have personally reviewed the following:   Today's Data  . EKG last night narrow complex tachycardia, very poor quality.  No definite acute changes noted. . Hemoglobin down to 8.0.  Scheduled Meds: . amLODipine  5 mg Oral Daily  . QUEtiapine  50 mg Oral QHS  . sodium chloride flush  3 mL Intravenous Q12H  . sodium chloride flush  3 mL Intravenous Q12H   Continuous Infusions: . sodium chloride    . sodium chloride 100 mL/hr at 07/11/19 2152    Principal Problem:   Ileus ([place]) Active Problems:   Liposarcoma of lower extremity (H[facility])   Dementia (H[facility])   Syncope   Vomiting   Acute blood loss anemia   LOS: 0 days   How to contact the [facility] Attending or Consulting provider [place] or covering provider during after hours [place], for this patient?    1. Check the care team in [place] and look for a) attending/consulting TRH provider listed and b) the [facility] team listed 2. Log into [email or web address] and use [place]'s universal password to access. If you do not have the password, please contact the hospital operator. 3. Locate the [facility] provider you are looking for under [facility] and page to a number that you can be directly reached. 4. If you still have difficulty reaching the provider, please page the [facility] (Director on Call) for the Hospitalists listed on amion for assistance.

## 2019-07-12 NOTE — Progress Notes (Signed)
   07/11/19 2341  Assess: MEWS Score  Temp 98.6 F (37 C)  BP 128/87  Pulse Rate (!) 143  ECG Heart Rate (!) 143  Level of Consciousness Alert  SpO2 100 %  O2 Device Room Air  O2 Flow Rate (L/min) 0 L/min  Assess: MEWS Score  MEWS Temp 0  MEWS Systolic 0  MEWS Pulse 3  MEWS RR 0  MEWS LOC 0  MEWS Score 3  MEWS Score Color Yellow  Assess: if the MEWS score is Yellow or Red  Were vital signs taken at a resting state? Yes  Focused Assessment Documented focused assessment  Early Detection of Sepsis Score *See Row Information* Low  MEWS guidelines implemented *See Row Information* Yes  Treat  MEWS Interventions Administered scheduled meds/treatments  Take Vital Signs  Increase Vital Sign Frequency  Yellow: Q 2hr X 2 then Q 4hr X 2, if remains yellow, continue Q 4hrs  Escalate  MEWS: Escalate Yellow: discuss with charge nurse/RN and consider discussing with provider and RRT  Notify: Charge Nurse/RN  Name of Charge Nurse/RN Notified JoAnne, RN  Date Charge Nurse/RN Notified 07/11/19  Time Charge Nurse/RN Notified 2345  Notify: Provider  Provider Name/Title M.Sharlet Salina  Date Provider Notified 07/11/19  Time Provider Notified 2340  Notification Type Page  Notification Reason Change in status (increased HR)  Response See new orders  Document  Patient Outcome Stabilized after interventions  Progress note created (see row info) Yes

## 2019-07-12 NOTE — TOC Initial Note (Signed)
Transition of Care [facility]) - Initial/Assessment Note    Patient Details  Name: Lynn Howell MRN: [id number] Date of Birth: [birth year]  [facility]) CM/SW Contact:    Dessa Phi, RN Phone Number: 07/12/2019, 4:54 PM  Clinical Narrative:  Patient non verbal-[place] son in rm to discuss d/c plans-home.has dme,would like hoyer lift-[employer] rep Zach aware-await order;PT cons-await recc. [place] recc-HHRN/aide.PTAR needed @ d/c.                 Expected Discharge Plan: [place] Barriers to Discharge: Continued Medical Work up   Patient Goals and CMS Choice Patient states their goals for this hospitalization and ongoing recovery are:: go home CMS Medicare.gov Compare Post Acute Care list provided to:: Patient Represenative (must comment) Choice offered to / list presented to : Adult Children  Expected Discharge Plan and Services Expected Discharge Plan: [place]   Discharge Planning Services: CM Consult Post Acute Care Choice: Durable Medical Equipment, Home Health (hoyer lift;HHRN/aide) Living arrangements for the past 2 months: Single Family Home                 DME Arranged:  (hoyer lift) DME Agency: [employer] Date DME Agency Contacted: 07/12/19 Time DME Agency Contacted: [phone] Representative spoke with at DME Agency: zach            Prior Living Arrangements/Services Living arrangements for the past 2 months: [place] with:: Adult Children Patient language and need for interpreter reviewed:: Yes Do you feel safe going back to the place where you live?: Yes      Need for Family Participation in Patient Care: No (Comment) Care giver support system in place?: Yes (comment) Current home services: DME (hospital bed,w/c,3n1,tub bench) Criminal Activity/Legal Involvement Pertinent to Current Situation/Hospitalization: No - Comment as needed  Activities of Daily Living Home Assistive Devices/Equipment:  Wheelchair, Hospital bed, Walker (specify type), Bedside commode/3-in-1 ADL Screening (condition at time of admission) Patient's cognitive ability adequate to safely complete daily activities?: No Is the patient deaf or have difficulty hearing?: No Does the patient have difficulty seeing, even when wearing glasses/contacts?: No Does the patient have difficulty concentrating, remembering, or making decisions?: Yes Patient able to express need for assistance with ADLs?: No Does the patient have difficulty dressing or bathing?: Yes Independently performs ADLs?: No Communication: Needs assistance Is this a change from baseline?: Pre-admission baseline Dressing (OT): Needs assistance Is this a change from baseline?: Pre-admission baseline Grooming: Needs assistance Is this a change from baseline?: Pre-admission baseline Feeding: Needs assistance Is this a change from baseline?: Pre-admission baseline Bathing: Needs assistance Is this a change from baseline?: Pre-admission baseline Toileting: Needs assistance Is this a change from baseline?: Pre-admission baseline In/Out Bed: Needs assistance Is this a change from baseline?: Pre-admission baseline Walks in Home: Dependent Is this a change from baseline?: Pre-admission baseline Does the patient have difficulty walking or climbing stairs?: Yes Weakness of Legs: Both Weakness of Arms/Hands: Both  Permission Sought/Granted Permission sought to share information with : Case Manager Permission granted to share information with : Yes, Verbal Permission Granted  Share Information with NAME: Case Manager     Permission granted to share info w Relationship: [place] son     Emotional Assessment Appearance:: Appears stated age Attitude/Demeanor/Rapport: Unable to Assess (non verbal) Affect (typically observed): Unable to Assess Orientation: : Oriented to Self Alcohol / Substance Use: Not Applicable Psych Involvement: No (comment)  Admission  diagnosis:  Syncope [  R55] Tachycardia [R00.0] Patient Active Problem List   Diagnosis Date Noted  . Ileus ([place]) 07/12/2019  . Vomiting 07/12/2019  . Acute blood loss anemia 07/12/2019  . Syncope 07/11/2019  . Insomnia 03/06/2019  . Subdural hematoma ([place]) 07/23/2018  . Dementia ([place]) 11/23/2017  . General weakness 11/22/2017  . Incontinence of urine 11/22/2017  . Bilateral knee pain 09/29/2017  . Gait disorder 09/14/2017  . Low back pain 09/14/2017  . Dizziness 03/08/2017  . Rash 09/07/2016  . Loose stools 03/13/2016  . Left foot pain 02/01/2014  . Bilateral leg pain 09/12/2013  . Peripheral edema 04/23/2012  . Left cervical radiculopathy 03/07/2012  . Preventative health care 07/15/2010  . Liposarcoma of lower extremity ([place]) 07/15/2010  . Osteoarthrosis involving lower leg 03/19/2010  . LUMBAR RADICULOPATHY, RIGHT 03/19/2010  . Backache 02/10/2010  . SPINAL STENOSIS 12/17/2008  . Hyperlipidemia 08/01/2007  . ANEMIA-IRON DEFICIENCY 08/01/2007  . Anxiety state 08/01/2007  . Depression 08/01/2007  . OSTEOPOROSIS 01/22/2007  . COMMON MIGRAINE 09/21/2006  . Essential hypertension 09/21/2006  . CONSTIPATION, CHRONIC 09/21/2006  . COLONIC POLYPS, HX OF 09/21/2006  . NEPHROLITHIASIS, HX OF 09/21/2006   PCP:  Biagio Borg, MD Pharmacy:   [facility], [place] - [address] [place] [address] Phone: [phone] Fax: [phone]     Social Determinants of Health (SDOH) Interventions    Readmission Risk Interventions No flowsheet data found.

## 2019-07-12 NOTE — Progress Notes (Signed)
  Echocardiogram 2D Echocardiogram has been performed.  Lynn Howell 07/12/2019, 9:07 AM

## 2019-07-13 LAB — BASIC METABOLIC PANEL
Anion gap: 6 (ref 5–15)
Anion gap: 7 (ref 5–15)
BUN: 33 mg/dL — ABNORMAL HIGH (ref 8–23)
BUN: 26 mg/dL — ABNORMAL HIGH (ref 8–23)
CO2: 23 mmol/L (ref 22–32)
CO2: 22 mmol/L (ref 22–32)
Calcium: 8.1 mg/dL — ABNORMAL LOW (ref 8.9–10.3)
Calcium: 8.2 mg/dL — ABNORMAL LOW (ref 8.9–10.3)
Chloride: 114 mmol/L — ABNORMAL HIGH (ref 98–111)
Chloride: 115 mmol/L — ABNORMAL HIGH (ref 98–111)
Creatinine, Ser: 1 mg/dL (ref 0.44–1.00)
Creatinine, Ser: 0.87 mg/dL (ref 0.44–1.00)
GFR calc Af Amer: 57 mL/min — ABNORMAL LOW (ref >60)
GFR calc Af Amer: >60 mL/min (ref >60)
GFR calc non Af Amer: 49 mL/min — ABNORMAL LOW (ref >60)
GFR calc non Af Amer: 58 mL/min — ABNORMAL LOW (ref >60)
Glucose, Bld: 94 mg/dL (ref 70–99)
Glucose, Bld: 95 mg/dL (ref 70–99)
Potassium: 3.8 mmol/L (ref 3.5–5.1)
Potassium: 4.6 mmol/L (ref 3.5–5.1)
Sodium: 143 mmol/L (ref 135–145)
Sodium: 144 mmol/L (ref 135–145)

## 2019-07-13 LAB — CBC
HCT: 19.8 % — ABNORMAL LOW (ref 36.0–46.0)
HCT: 20.5 % — ABNORMAL LOW (ref 36.0–46.0)
Hemoglobin: 6.2 g/dL — CL (ref 12.0–15.0)
Hemoglobin: 6.4 g/dL — CL (ref 12.0–15.0)
MCH: 28.7 pg (ref 26.0–34.0)
MCH: 28.6 pg (ref 26.0–34.0)
MCHC: 31.3 g/dL (ref 30.0–36.0)
MCHC: 31.2 g/dL (ref 30.0–36.0)
MCV: 91.7 fL (ref 80.0–100.0)
MCV: 91.5 fL (ref 80.0–100.0)
Platelets: 202 10*3/uL (ref 150–400)
Platelets: 219 10*3/uL (ref 150–400)
RBC: 2.16 MIL/uL — ABNORMAL LOW (ref 3.87–5.11)
RBC: 2.24 MIL/uL — ABNORMAL LOW (ref 3.87–5.11)
RDW: 16.6 % — ABNORMAL HIGH (ref 11.5–15.5)
RDW: 16.6 % — ABNORMAL HIGH (ref 11.5–15.5)
WBC: 9.6 10*3/uL (ref 4.0–10.5)
WBC: 9.1 10*3/uL (ref 4.0–10.5)
nRBC: 0 % (ref 0.0–0.2)
nRBC: 0 % (ref 0.0–0.2)

## 2019-07-13 LAB — PHOSPHORUS: Phosphorus: 2.1 mg/dL — ABNORMAL LOW (ref 2.5–4.6)

## 2019-07-13 LAB — MAGNESIUM: Magnesium: 2.4 mg/dL (ref 1.7–2.4)

## 2019-07-13 LAB — URINE CULTURE: Culture: NO GROWTH

## 2019-07-13 LAB — PREPARE RBC (CROSSMATCH)

## 2019-07-13 MED ORDER — POTASSIUM PHOSPHATES 15 MMOLE/5ML IV SOLN
20.0000 mmol | Freq: Once | INTRAVENOUS | Status: AC
  Administered 2019-07-13: 20 mmol via INTRAVENOUS
  Filled 2019-07-13: qty 6.67

## 2019-07-13 MED ORDER — SODIUM CHLORIDE 0.9% IV SOLUTION: Freq: Once | INTRAVENOUS | Status: AC

## 2019-07-13 MED ORDER — POTASSIUM CHLORIDE 10 MEQ/100ML IV SOLN
10.0000 meq | INTRAVENOUS | Status: AC
  Administered 2019-07-13 (×4): 10 meq via INTRAVENOUS
  Filled 2019-07-13 (×4): qty 100

## 2019-07-13 NOTE — Progress Notes (Signed)
PT continually clears throat, hold saliva and contents from throat in mouth, requires frequent suctioning of mouth.  Pt unable to follow simple instructions.  RN concern about pt swallowing function given advanced dementia

## 2019-07-13 NOTE — Progress Notes (Signed)
CRITICAL VALUE ALERT  Critical Value:  Hgb 6.4  Date & Time Notied:  07/13/19 5053  Provider Notified: via text page at [phone]  Orders Received/Actions taken: pending

## 2019-07-13 NOTE — Plan of Care (Signed)

## 2019-07-13 NOTE — Progress Notes (Signed)
PROGRESS NOTE  Lynn Howell ENI:[id number] DOB: 03-29-1925 DOA: 07/11/2019 PCP: Biagio Borg, MD  Brief History   10yow PMH advanced dementia presented from home with several episodes of unresponsiveness.  Initial evaluation was unrevealing, observation requested for further evaluation.  Subsequently developed tachycardia with narrow complex, dark emesis.  A & P  Vomiting, colonic ileus.  Has not had any vomiting since 6/24.   --No further significant suction material in canister.  Material consistent with bile.   --Abdominal exam benign --Clinically seems to be resolving --Replace potassium, phosphorus.  Magnesium at goal. --Check electrolytes in a.m and repeat AXR.  ABLA  --No evidence of bleeding, however when admitted did appear to have hematemesis or coffee-ground emesis. --Hemoglobin about 2 g down compared to yesterday. --Transfuse 2 units PRBC --Discussed in detail with son, goals of care remain the same, conservative management, no endoscopy or invasive intervention or work-up.  Episodes of unresponsiveness.  All occurred while sitting, difficult to assess given patient's advanced dementia.  While differential is broad, would favor vasovagal type phenomenon.  No new deficits, CT head negative, no evidence to suggest stroke.  Although seizure is considered, this is low on the differential, will monitor on telemetry but will not pursue EEG unless a definite event is captured during this hospitalization.  Orthostatics not testable given the patient's bedbound/wheelchair status. --No episodes of unresponsiveness during hospitalization.  Telemetry unrevealing.   --2D echocardiogram normal LVEF, grade 1 diastolic dysfunction.  Normal right ventricular systolic function. --Suspect related to bleeding.  No further evaluation suggested.  Potential pulmonary nodules on the left versus callus formation from rib fractures. --Given patient's advanced dementia, as well as approach to  liposarcoma, no further evaluation suggested.  Advanced dementia cared for at home --Appears to be at baseline, plan will be to return home with family when able.  Liposarcoma left thigh diagnosed 2012. --Last seen by oncology 2019 with plan for no further evaluation or treatment.  Slow-growing tumor.  Disposition Plan:  Discussion: Condition remains guarded, she has had a 4 g drop in her hemoglobin since admission, no evidence of bleeding, no bowel movement since yesterday.  No history obtainable secondary to dementia.  No further vomiting since admission.  I suspect upper GI bleed as source.  Differential would include peptic ulcer disease.  Lower GI bleed would also be possible but seems less likely as she had a normal stool in the emergency department and has not had a bowel movement since.  Abdominal exam remains benign and I do not think abdominal catastrophe likely.  I have discussed typical work-up with the son which usually would include gastroenterology evaluation, consideration for endoscopy, CT imaging.  However given advanced dementia, advanced age, known liposarcoma, son elects conservative management, transfuse PRBC and monitor.  I will repeat abdominal x-ray tomorrow, lab studies, if condition has stabilized, will trial diet.  If condition were to worsen, I think son would agree to comfort care.  Status is: Inpatient  The patient will require care spanning > 2 midnights and should be moved to inpatient because: Inpatient level of care appropriate due to severity of illness  Dispo: The patient is from: Home              Anticipated d/c is to: Home              Anticipated d/c date is: 2 days              Patient currently is not medically stable to  d/c.  DVT prophylaxis:  Place and maintain sequential compression device Start: 07/12/19 1036 Code Status: DNR Family Communication: Son by telephone 6/26  Murray Hodgkins, MD  [facility] Direct contact: see [email or web address]  (further directions at bottom of note if needed) 7PM-7AM contact night coverage as at bottom of note 07/13/2019, 1:48 PM  LOS: 1 day   Significant Hospital Events   . 6/24 admitted for syncope, then developed tachycardia and vomiting, ileus.   Consults:  .    Procedures:  .   Significant Diagnostic Tests:  Marland Kitchen    Micro Data:  .    Antimicrobials:  .   Interval History/Subjective  Discussed with nursing.  No bowel movement overnight.  No vomiting.  No bleeding.  Uneventful night.  Patient unable to provide history secondary to dementia.  Objective   Vitals:  Vitals:   07/12/19 2100 07/13/19 0228  BP:    Pulse:    Resp:    Temp: 98.4 F (36.9 C) 99 F (37.2 C)  SpO2:      Exam:  Constitutional.  Appears calm and comfortable lying in bed. Psychiatric.  Cannot assess mood or affect.  Speech is somewhat difficult to understand and limited.  Very awake and does respond when spoken to. Cardiovascular.  Regular rate and rhythm.  No murmur, rub or gallop.  No lower extremity edema.  Telemetry sinus rhythm. Respiratory.  Clear to auscultation bilaterally.  No wheezes, rales or rhonchi.  Normal respiratory effort. Abdomen.  Soft, nontender, nondistended.  I have personally reviewed the following:   Today's Data   BMP unremarkable.  Phosphorus slightly low at 2.1.  Magnesium within normal limits.  Hemoglobin 6.2, thought spurious, however repeat did confirm hemoglobin at 6.4.  Remainder CBC unremarkable.  Scheduled Meds: . sodium chloride   Intravenous Once  . amLODipine  5 mg Oral Daily  . QUEtiapine  50 mg Oral QHS  . sodium chloride flush  3 mL Intravenous Q12H  . sodium chloride flush  3 mL Intravenous Q12H   Continuous Infusions: . sodium chloride    . sodium chloride 100 mL/hr at 07/12/19 1529    Principal Problem:   Ileus ([place]) Active Problems:   Liposarcoma of lower extremity (HCC)   Dementia (HCC)   Syncope   Vomiting   Acute blood loss anemia    LOS: 1 day   How to contact the [facility] Attending or Consulting provider [place] or covering provider during after hours [place], for this patient?  1. Check the care team in [facility] and look for a) attending/consulting TRH provider listed and b) the [facility] team listed 2. Log into [email or web address] and use [place]'s universal password to access. If you do not have the password, please contact the hospital operator. 3. Locate the [facility] provider you are looking for under [facility] and page to a number that you can be directly reached. 4. If you still have difficulty reaching the provider, please page the [facility] (Director on Call) for the Hospitalists listed on amion for assistance.

## 2019-07-13 NOTE — Plan of Care (Signed)
  Problem: Elimination: Goal: Will not experience complications related to urinary retention Outcome: Progressing   Problem: Safety: Goal: Ability to remain free from injury will improve Outcome: Progressing   

## 2019-07-13 NOTE — Progress Notes (Signed)
Critical Lab   Hemoglobin 6.2 Provider made aware  Will continue to monitor

## 2019-07-14 ENCOUNTER — Inpatient Hospital Stay (HOSPITAL_COMMUNITY): Payer: PPO

## 2019-07-14 DIAGNOSIS — K922 Gastrointestinal hemorrhage, unspecified: Secondary | ICD-10-CM

## 2019-07-14 LAB — BASIC METABOLIC PANEL
Anion gap: 9 (ref 5–15)
BUN: 22 mg/dL (ref 8–23)
CO2: 21 mmol/L — ABNORMAL LOW (ref 22–32)
Calcium: 7.9 mg/dL — ABNORMAL LOW (ref 8.9–10.3)
Chloride: 111 mmol/L (ref 98–111)
Creatinine, Ser: 0.87 mg/dL (ref 0.44–1.00)
GFR calc Af Amer: >60 mL/min (ref >60)
GFR calc non Af Amer: 58 mL/min — ABNORMAL LOW (ref >60)
Glucose, Bld: 107 mg/dL — ABNORMAL HIGH (ref 70–99)
Potassium: 4.5 mmol/L (ref 3.5–5.1)
Sodium: 141 mmol/L (ref 135–145)

## 2019-07-14 LAB — BPAM RBC
Blood Product Expiration Date: 202107092359
Blood Product Expiration Date: 202107092359
ISSUE DATE / TIME: 202106261411
ISSUE DATE / TIME: 202106261819
Unit Type and Rh: 6200
Unit Type and Rh: 6200

## 2019-07-14 LAB — TYPE AND SCREEN
ABO/RH(D): AB POS
Antibody Screen: NEGATIVE
Unit division: 0
Unit division: 0

## 2019-07-14 LAB — CBC
HCT: 31.7 % — ABNORMAL LOW (ref 36.0–46.0)
HCT: 30.8 % — ABNORMAL LOW (ref 36.0–46.0)
Hemoglobin: 10.7 g/dL — ABNORMAL LOW (ref 12.0–15.0)
Hemoglobin: 10.2 g/dL — ABNORMAL LOW (ref 12.0–15.0)
MCH: 29.8 pg (ref 26.0–34.0)
MCH: 29.1 pg (ref 26.0–34.0)
MCHC: 33.8 g/dL (ref 30.0–36.0)
MCHC: 33.1 g/dL (ref 30.0–36.0)
MCV: 88.3 fL (ref 80.0–100.0)
MCV: 87.7 fL (ref 80.0–100.0)
Platelets: 228 10*3/uL (ref 150–400)
Platelets: 215 10*3/uL (ref 150–400)
RBC: 3.59 MIL/uL — ABNORMAL LOW (ref 3.87–5.11)
RBC: 3.51 MIL/uL — ABNORMAL LOW (ref 3.87–5.11)
RDW: 16.6 % — ABNORMAL HIGH (ref 11.5–15.5)
RDW: 17.2 % — ABNORMAL HIGH (ref 11.5–15.5)
WBC: 11.5 10*3/uL — ABNORMAL HIGH (ref 4.0–10.5)
WBC: 10.2 10*3/uL (ref 4.0–10.5)
nRBC: 0 % (ref 0.0–0.2)
nRBC: 0 % (ref 0.0–0.2)

## 2019-07-14 LAB — PHOSPHORUS: Phosphorus: 2.7 mg/dL (ref 2.5–4.6)

## 2019-07-14 LAB — MAGNESIUM: Magnesium: 2.1 mg/dL (ref 1.7–2.4)

## 2019-07-14 MED ORDER — PANTOPRAZOLE SODIUM 40 MG IV SOLR
40.0000 mg | Freq: Two times a day (BID) | INTRAVENOUS | Status: DC
  Administered 2019-07-14 – 2019-07-16 (×5): 40 mg via INTRAVENOUS
  Filled 2019-07-14 (×5): qty 40

## 2019-07-14 MED ORDER — METOPROLOL TARTRATE 5 MG/5ML IV SOLN
2.5000 mg | Freq: Three times a day (TID) | INTRAVENOUS | Status: DC
  Administered 2019-07-14 – 2019-07-15 (×4): 2.5 mg via INTRAVENOUS
  Filled 2019-07-14 (×4): qty 5

## 2019-07-14 MED ORDER — SODIUM CHLORIDE 0.9 % IV SOLN: INTRAVENOUS | Status: DC

## 2019-07-14 MED ORDER — METOPROLOL TARTRATE 5 MG/5ML IV SOLN: 2.5000 mg | Freq: Three times a day (TID) | INTRAVENOUS | Status: DC

## 2019-07-14 NOTE — Progress Notes (Addendum)
PROGRESS NOTE  Lynn Howell WUX:[id number] DOB: Feb 07, 1925 DOA: 07/11/2019 PCP: Biagio Borg, MD  Brief History   39yow PMH advanced dementia presented from home with several episodes of unresponsiveness.  Initial evaluation was unrevealing, observation requested for further evaluation.  Subsequently developed tachycardia with narrow complex, dark emesis.  A & P  Vomiting, colonic ileus.  Has not had any vomiting since 6/24.   --No vomiting now more than 48 hours.  Abdominal imaging shows resolution of ileus. --Abdominal exam without significant change, perhaps mild tenderness. --Magnesium and potassium at goal.  ABLA  --No evidence of bleeding, however when admitted did appear to have hematemesis or coffee-ground emesis. --Hemoglobin did drop significantly over 48 hours, transfused PRBC 2 units 6/26 with appropriate response and stability over the last 12 hours. --Upon reflection, GI bleed possible.  Have been in close discussion with son each day, plan is to continue very conservative management, no gastroenterology consultation, invasive work-up or CT imaging planned.  Episodes of unresponsiveness.  All occurred while sitting, difficult to assess given patient's advanced dementia.  While differential is broad, would favor vasovagal type phenomenon.  No new deficits, CT head negative, no evidence to suggest stroke.  Although seizure is considered, this is low on the differential, will monitor on telemetry but will not pursue EEG unless a definite event is captured during this hospitalization.  Orthostatics not testable given the patient's bedbound/wheelchair status. --No episodes of unresponsiveness during hospitalization.  Telemetry predominantly sinus rhythm, now a second episode of narrow complex tachycardia --Transient atrial fibrillation versus atrial tachycardia.  Now back in sinus rhythm.  Given anemia, possible GI bleed as well as goals of care, no anticoagulation is indicated.   Will start low-dose beta-blocker. --2D echocardiogram normal LVEF, grade 1 diastolic dysfunction.  Normal right ventricular systolic function.  Potential pulmonary nodules on the left versus callus formation from rib fractures. --Given patient's advanced dementia, as well as approach to liposarcoma, no further evaluation suggested.  Advanced dementia cared for at home --Appears to be at baseline, plan will be to return home with family when able.  Liposarcoma left thigh diagnosed 2012. --Last seen by oncology 2019 with plan for no further evaluation or treatment.  Slow-growing tumor.  Disposition Plan:  Discussion: Condition remains guarded, difficult to assess given advanced dementia, however hemoglobin is now stable status post 2 units PRBC.  Suspect GI bleed subacute, no bleeding here.  No further work-up given goals of care as documented above.  Will check CBC in a.m.  Monitor for vomiting, bleeding.  Usually on a regular diet at home and does not retain her spit.  Here retaining phlegm, last speech therapy to evaluate.  Status is: Inpatient  The patient will require care spanning > 2 midnights and should be moved to inpatient because: Inpatient level of care appropriate due to severity of illness  Dispo: The patient is from: Home              Anticipated d/c is to: Home              Anticipated d/c date is: 2 days              Patient currently is not medically stable to d/c.  DVT prophylaxis:  Place and maintain sequential compression device Start: 07/12/19 1036 Code Status: DNR Family Communication: Discussed with son yesterday at bedside, with his sister as well as by telephone yesterday.  Will update today.  Discussed with son by telephone today.  Murray Hodgkins, MD  [facility] Direct contact: see [email or web address] (further directions at bottom of note if needed) 7PM-7AM contact night coverage as at bottom of note 07/14/2019, 10:52 AM  LOS: 2 days   Significant  Hospital Events   . 6/24 admitted for syncope, then developed tachycardia and vomiting, ileus.   Consults:  .    Procedures:  .   Significant Diagnostic Tests:  Marland Kitchen    Micro Data:  .    Antimicrobials:  .   Interval History/Subjective  No issues noted overnight.  No bowel movement.  No vomiting reported.  Patient unable to offer history.  This morning per RN, suddenly developed rapid narrow complex tachycardia.  By the time I arrived to see the patient back in sinus rhythm.  Objective   Vitals:  Vitals:   07/14/19 0514 07/14/19 1015  BP: 140/77   Pulse: 90   Resp: (!) 24   Temp:  98.7 F (37.1 C)  SpO2: 100%     Exam:  Constitutional.  Appears mildly uncomfortable, calm, nontoxic.  Responds briefly inappropriately. Psychiatric.  Confused.  Does not answer questions clearly appropriately.  Not able to offer history. Cardiovascular.  Tachycardic, no murmur, rub or gallop.  No lower extremity edema.  Earlier telemetry showed sinus rhythm.  Current telemetry shows sinus tachycardia with ectopy. Respiratory.  Clear to auscultation bilaterally.  No wheezes, rales or rhonchi.  Normal respiratory effort. Abdomen.  Quite thin.  Aorta palpable.  Nondistended.  Perhaps mildly tender, difficult to say.  I have personally reviewed the following:   Today's Data   BMP unremarkable, potassium and magnesium at goal.  Phosphorus within normal limits.  Hemoglobin stable status post 2 units PRBC, 10.2.  Abdominal x-ray shows resolution of ileus.  EKG independently reviewed shows narrow complex tachycardia, irregular, possibly atrial fibrillation.  No acute changes.  Scheduled Meds: . amLODipine  5 mg Oral Daily  . metoprolol tartrate  2.5 mg Intravenous Q8H  . pantoprazole (PROTONIX) IV  40 mg Intravenous Q12H  . QUEtiapine  50 mg Oral QHS  . sodium chloride flush  3 mL Intravenous Q12H  . sodium chloride flush  3 mL Intravenous Q12H   Continuous Infusions: . sodium  chloride    . sodium chloride 50 mL/hr at 07/14/19 1021    Principal Problem:   Ileus ([place]) Active Problems:   Liposarcoma of lower extremity (HCC)   Dementia (HCC)   Syncope   Vomiting   Acute blood loss anemia   LOS: 2 days   How to contact the [facility] Attending or Consulting provider [place] or covering provider during after hours [place], for this patient?  1. Check the care team in [facility] and look for a) attending/consulting TRH provider listed and b) the [facility] team listed 2. Log into [email or web address] and use [place]'s universal password to access. If you do not have the password, please contact the hospital operator. 3. Locate the [facility] provider you are looking for under [facility] and page to a number that you can be directly reached. 4. If you still have difficulty reaching the provider, please page the [facility] (Director on Call) for the Hospitalists listed on amion for assistance.

## 2019-07-14 NOTE — Plan of Care (Signed)

## 2019-07-15 LAB — CBC
HCT: 33.4 % — ABNORMAL LOW (ref 36.0–46.0)
Hemoglobin: 10.8 g/dL — ABNORMAL LOW (ref 12.0–15.0)
MCH: 29.1 pg (ref 26.0–34.0)
MCHC: 32.3 g/dL (ref 30.0–36.0)
MCV: 90 fL (ref 80.0–100.0)
Platelets: 199 10*3/uL (ref 150–400)
RBC: 3.71 MIL/uL — ABNORMAL LOW (ref 3.87–5.11)
RDW: 17.2 % — ABNORMAL HIGH (ref 11.5–15.5)
WBC: 11.3 10*3/uL — ABNORMAL HIGH (ref 4.0–10.5)
nRBC: 0 % (ref 0.0–0.2)

## 2019-07-15 LAB — BASIC METABOLIC PANEL
Anion gap: 5 (ref 5–15)
BUN: 14 mg/dL (ref 8–23)
CO2: 22 mmol/L (ref 22–32)
Calcium: 7.9 mg/dL — ABNORMAL LOW (ref 8.9–10.3)
Chloride: 115 mmol/L — ABNORMAL HIGH (ref 98–111)
Creatinine, Ser: 0.79 mg/dL (ref 0.44–1.00)
GFR calc Af Amer: >60 mL/min (ref >60)
GFR calc non Af Amer: >60 mL/min (ref >60)
Glucose, Bld: 96 mg/dL (ref 70–99)
Potassium: 4.3 mmol/L (ref 3.5–5.1)
Sodium: 142 mmol/L (ref 135–145)

## 2019-07-15 LAB — MAGNESIUM: Magnesium: 1.9 mg/dL (ref 1.7–2.4)

## 2019-07-15 LAB — PHOSPHORUS: Phosphorus: 2.3 mg/dL — ABNORMAL LOW (ref 2.5–4.6)

## 2019-07-15 MED ORDER — METOPROLOL TARTRATE 25 MG PO TABS
12.5000 mg | ORAL_TABLET | Freq: Two times a day (BID) | ORAL | Status: DC
  Administered 2019-07-15: 12.5 mg via ORAL
  Filled 2019-07-15: qty 1

## 2019-07-15 MED ORDER — POTASSIUM PHOSPHATES 15 MMOLE/5ML IV SOLN
20.0000 mmol | Freq: Once | INTRAVENOUS | Status: AC
  Administered 2019-07-15: 20 mmol via INTRAVENOUS
  Filled 2019-07-15: qty 6.67

## 2019-07-15 NOTE — Care Management Important Message (Signed)
Important Message  Patient Details  IM Letter given to Dessa Phi RN Case Manager to present to the Patient Name: Lynn Howell MRN: [id number] Date of Birth: 1925/10/25   Medicare Important Message Given:  Yes     Kerin Salen 07/15/2019, 12:15 PM

## 2019-07-15 NOTE — Progress Notes (Addendum)
Pt has had only 47mL urine out plus an unmeasured amount d/t purewick failure.  Day RN got fluid increased to 164mL 6/27 @ 1021, pt has been dry all shift no urine out.  Pt now input positive 5+ liters  Will attempt bladder scan, paged [facility] night on call for foley order   Bladder scan #1 showed 373 Bladder scan # 2 showered >339   Pt abdomen/bladder area hard and distended  0438 second request for foley sent, order for in and out cath before placing a foley   I/O cath resulted in 426mL out, pt abdomen less distended but still feels hard to palpation.  Urine concentrated with some sediment noted in output.  Labia slightly swollen and irritated from purewick use

## 2019-07-15 NOTE — TOC Progression Note (Signed)
Transition of Care [facility]) - Progression Note    Patient Details  Name: Lynn Howell MRN: [id number] Date of Birth: December 10, 1925  Transition of [facility]) CM/SW Contact  Lynn Howell, Lynn Pulse, RN Phone Number: 07/15/2019, 3:16 PM  Clinical Narrative: patient advanced dementia defer to Son Lynn Howell-d/c plan home.[place] for HHRN/aide;[employer] dme for hoyer lift(they will arrange delivery w/son Lynn Howell).PTAR for non emergency transport home-awaiting auth per insurance.      Expected Discharge Plan: [place] Barriers to Discharge: Continued Medical Work up  Expected Discharge Plan and Services Expected Discharge Plan: [place]   Discharge Planning Services: CM Consult Post Acute Care Choice: Durable Medical Equipment, Home Health (hoyer lift;HHRN/aide) Living arrangements for the past 2 months: Single Family Home                 DME Arranged: Other see comment [facility] lift) DME Agency: [employer] Date DME Agency Contacted: 07/15/19 Time DME Agency Contacted: [phone] Representative spoke with at DME Agency: Lynn Howell HH Arranged: RN, Nurse's Aide [place] Agency: [facility] Date [place]: 07/15/19 Time [place]: 1516 Representative spoke with at [place]: Lynn Howell ([place]) Interventions    Readmission Risk Interventions No flowsheet data found.

## 2019-07-15 NOTE — Progress Notes (Signed)
Pt's  bedside monitor data was validated every minute on flowsheet charting.  Pt's MEWS score remained green when complete set of vital signs were obtained.  Rithwik Schmieg, Laurel Dimmer, RN

## 2019-07-15 NOTE — Evaluation (Signed)
Clinical/Bedside Swallow Evaluation Patient Details  Name: Lynn Howell MRN: [id number] Date of Birth: Jul 27, 1925  Today's Date: 07/15/2019 Time: SLP Start Time (ACUTE ONLY): 1411 SLP Stop Time (ACUTE ONLY): 1431 SLP Time Calculation (min) (ACUTE ONLY): 20 min  Past Medical History:  Past Medical History:  Diagnosis Date  . ANEMIA-IRON DEFICIENCY 08/01/2007  . ANKLE PAIN, LEFT 01/22/2007  . Arthritis   . BACK PAIN, CHRONIC, INTERMITTENT 02/10/2010  . CERUMEN IMPACTION, RIGHT 04/20/2007  . COLONIC POLYPS, HX OF 09/21/2006  . COMMON MIGRAINE 09/21/2006  . CONSTIPATION, CHRONIC 09/21/2006  . CONTACT DERMATITIS 08/01/2008  . DEGENERATIVE JOINT DISEASE, KNEES, BILATERAL 03/19/2010  . Dementia ([place])   . DEPRESSION 08/01/2007  . Hypercholesteremia   . HYPERLIPIDEMIA 08/01/2007  . Hypertension   . HYPERTENSION 09/21/2006  . KNEE PAIN, BILATERAL 09/15/2008  . Left cervical radiculopathy 03/07/2012  . LEG PAIN, RIGHT 03/03/2008  . Liposarcoma of lower extremity ([place]) 04/14/2010  . LUMBAR RADICULOPATHY, RIGHT 03/19/2010  . NEPHROLITHIASIS, HX OF 09/21/2006  . OSTEOPENIA 12/17/2008  . OSTEOPOROSIS 01/22/2007  . SPINAL STENOSIS 12/17/2008  . Stroke ([place])   . Swelling of limb 03/19/2010  . URI 04/20/2007   Past Surgical History:  Past Surgical History:  Procedure Laterality Date  . ABDOMINAL HYSTERECTOMY    . CHOLECYSTECTOMY    . TUBAL LIGATION     HPI:      Assessment / Plan / Recommendation Clinical Impression  Pt today without focal CN deficits and uttering "yeah" to SLP questions.  She was able to say part of word cranberry when given verbal choice of 2 items to drink.  No focal CN She did not follow directions but was able to help feed herself.  Demonstrated ability to seal lips on spoon and straw.    She was observed consuming tea, cranberry juice, gingerale, jello, icecream and a single bite of cracker.  Pt with clinically timely swallow and no indication of dysphagia.  Minimal oral retention of  secretons noted but cleared with intake.  Recommend when Md indicates to advance to dys3/thin - pt with dentures and softer foods would decrease mastication labor.  Pt with frequent belching thus would recommend strict esophageal precautions.  Will follow up x1 to educate son to recommendations and assure pt tolerating po intake.  SLP Visit Diagnosis: Dysphagia, unspecified (R13.10)    Aspiration Risk  Mild aspiration risk    Diet Recommendation Dysphagia 3 (Mech soft);Thin liquid (when md indicates appropriate)   Liquid Administration via: Cup;Straw Medication Administration: Whole meds with puree Supervision: Staff to assist with self feeding Compensations: Slow rate;Small sips/bites Postural Changes: Seated upright at 90 degrees;Remain upright for at least 30 minutes after po intake    Other  Recommendations Oral Care Recommendations: Oral care BID   Follow up Recommendations        Frequency and Duration min 1 x/week  1 week       Prognosis Prognosis for Safe Diet Advancement: Good      Swallow Study   General Date of Onset: 07/15/19 Type of Study: Bedside Swallow Evaluation Diet Prior to this Study: Thin liquids (clears) Temperature Spikes Noted: No Respiratory Status: Room air History of Recent Intubation: No Behavior/Cognition: Alert;Cooperative;Pleasant mood Oral Cavity Assessment: Within Functional Limits Oral Care Completed by SLP: No Oral Cavity - Dentition: Adequate natural dentition Vision: Functional for self-feeding Self-Feeding Abilities: Needs assist Patient Positioning: Upright in bed Baseline Vocal Quality: Low vocal intensity Volitional Cough: Cognitively unable to elicit Volitional Swallow: Unable to  elicit    Oral/Motor/Sensory Function Overall Oral Motor/Sensory Function: Within functional limits (sealed lips on straw and spoon, did not follow directions)   Ice Chips Ice chips: Not tested   Thin Liquid Thin Liquid: Within functional limits     Nectar Thick Nectar Thick Liquid: Not tested   Honey Thick Honey Thick Liquid: Not tested   Puree Puree: Within functional limits Presentation: Self Fed;Spoon   Solid     Solid: Within functional limits Presentation: Self Fed      Macario Golds 07/15/2019,3:38 PM  Kathleen Lime, MS [place] Office [phone]

## 2019-07-15 NOTE — Progress Notes (Signed)
Pt has limited movement in all extremities with contracture present to the right lower extremity.  Requires 2 assist to turn pt. Tequlia Gonsalves, Laurel Dimmer, RN

## 2019-07-15 NOTE — Progress Notes (Signed)
PROGRESS NOTE  Lynn Howell OVF:[id number] DOB: 1925-12-29 DOA: 07/11/2019 PCP: Biagio Borg, MD  Brief History   52yow PMH advanced dementia presented from home with several episodes of unresponsiveness.  Initial evaluation was unrevealing, observation requested for further evaluation.  Subsequently developed tachycardia with narrow complex, dark emesis.  A & P  Vomiting, colonic ileus.  Has not had any vomiting since 6/24.   --No vomiting now more than 72 hours.  Most recent abdominal imaging showed resolution of ileus. --Magnesium and potassium at goal. --Advance diet.  ABLA, presumed GI bleed --No evidence of bleeding, however when admitted did appear to have hematemesis or coffee-ground emesis. --Hemoglobin did drop significantly over 48 hours, transfused PRBC 2 units 6/26 with appropriate response and stability over the last 12 hours. --Upon reflection, GI bleed suspected.  Have been in close discussion with son each day, plan is to continue very conservative management, no gastroenterology consultation, invasive work-up or CT imaging planned.  Episodes of unresponsiveness.  All occurred while sitting, difficult to assess given patient's advanced dementia.  While differential is broad, would favor vasovagal type phenomenon.  No new deficits, CT head negative, no evidence to suggest stroke.  Although seizure is considered, this is low on the differential, will monitor on telemetry but will not pursue EEG unless a definite event is captured during this hospitalization.  Orthostatics not testable given the patient's bedbound/wheelchair status. --No episodes of unresponsiveness during hospitalization.  Telemetry predominantly sinus rhythm, a few brief instances of narrow complex tachycardia, possibly transient atrial fibrillation versus atrial tachycardia.   --Currently in sinus rhythm. Given anemia, suspected GI bleed as well as goals of care, no anticoagulation is indicated.  Continue  low-dose beta-blocker. --2D echocardiogram normal LVEF, grade 1 diastolic dysfunction.  Normal right ventricular systolic function.  Potential pulmonary nodules on the left versus callus formation from rib fractures. --Given patient's advanced dementia, as well as approach to liposarcoma, no further evaluation suggested.  Advanced dementia cared for at home --Appears to be at baseline, plan will be to return home with family when able.  Liposarcoma left thigh diagnosed 2012. --Last seen by oncology 2019 with plan for no further evaluation or treatment.  Slow-growing tumor.  Disposition Plan:  Discussion: Overall appears stable.  Hemoglobin is stable and electrolytes are stable.  Overall condition and prognosis remain guarded given her advanced dementia and other issues as documented above, however there is been no bleeding noted.  Will advance diet, change beta-blocker to oral and monitor overnight.  I suspect she will do better at home with oral intake and her usual environment.  Anticipate discharge home tomorrow.  Status is: Inpatient  The patient will require care spanning > 2 midnights and should be moved to inpatient because: Inpatient level of care appropriate due to severity of illness  Dispo: The patient is from: Home              Anticipated d/c is to: Home              Anticipated d/c date is: 1 day              Patient currently is not medically stable to d/c.  DVT prophylaxis:  Place and maintain sequential compression device Start: 07/12/19 1036 Code Status: DNR Family Communication: discussed with son by telephone, reviewed current care and plan for discharge home tomorrow; he understands and wants to continue very conservative management.  Murray Hodgkins, MD  [facility] Direct contact: see [email or web address] (further  directions at bottom of note if needed) 7PM-7AM contact night coverage as at bottom of note 07/15/2019, 3:43 PM  LOS: 3 days   Significant Hospital  Events   . 6/24 admitted for syncope, then developed tachycardia and vomiting, ileus.   Consults:  .    Procedures:  .   Significant Diagnostic Tests:  Marland Kitchen    Micro Data:  .    Antimicrobials:  .   Interval History/Subjective  Decreased urine output overnight.  Status post straight in and out cath.  Seen by speech therapy and cleared for dysphagia 3 diet.  Patient appears unchanged today.  Unable to offer history.  Objective   Vitals:  Vitals:   07/15/19 1100 07/15/19 1245  BP:  121/80  Pulse: 86 83  Resp: 19 18  Temp:  97.7 F (36.5 C)  SpO2: 98% 98%    Exam:  Constitutional.  Appears chronically ill, difficult to assess comfort level, nontoxic. Respiratory.  Clear to auscultation bilaterally.  No wheezes, rales or rhonchi.  Normal respiratory effort. Cardiovascular.  Regular rate and rhythm.  No murmur, rub or gallop.  No lower extremity edema.  Telemetry sinus rhythm. Abdomen.  Thin.  Nondistended.  Soft when patient not guarding. Psychiatric.  Cannot assess mood or affect.  I have personally reviewed the following:   Today's Data   Basic metabolic panel unremarkable.  Phosphorus slightly low at 2.3.  Magnesium within normal limits.  Hemoglobin stable at 10.8.  Scheduled Meds: . amLODipine  5 mg Oral Daily  . metoprolol tartrate  2.5 mg Intravenous Q8H  . pantoprazole (PROTONIX) IV  40 mg Intravenous Q12H  . QUEtiapine  50 mg Oral QHS  . sodium chloride flush  3 mL Intravenous Q12H  . sodium chloride flush  3 mL Intravenous Q12H   Continuous Infusions: . sodium chloride    . sodium chloride 125 mL/hr at 07/15/19 1453  . potassium PHOSPHATE IVPB (in mmol) 20 mmol (07/15/19 1246)    Principal Problem:   Ileus ([place]) Active Problems:   Liposarcoma of lower extremity (HCC)   Dementia (HCC)   Syncope   Vomiting   Acute blood loss anemia   LOS: 3 days   How to contact the [facility] Attending or Consulting provider [place] or covering provider during  after hours [place], for this patient?  1. Check the care team in [facility] and look for a) attending/consulting TRH provider listed and b) the [facility] team listed 2. Log into [email or web address] and use [place]'s universal password to access. If you do not have the password, please contact the hospital operator. 3. Locate the [facility] provider you are looking for under [facility] and page to a number that you can be directly reached. 4. If you still have difficulty reaching the provider, please page the [facility] (Director on Call) for the Hospitalists listed on amion for assistance.

## 2019-07-15 NOTE — Plan of Care (Signed)
  Problem: Activity: Goal: Risk for activity intolerance will decrease Outcome: Not Progressing   Problem: Nutrition: Goal: Adequate nutrition will be maintained Outcome: Not Progressing   Problem: Elimination: Goal: Will not experience complications related to bowel motility Outcome: Not Progressing   Problem: Pain Managment: Goal: General experience of comfort will improve Outcome: Completed/Met   Problem: Safety: Goal: Ability to remain free from injury will improve Outcome: Completed/Met

## 2019-07-16 LAB — BASIC METABOLIC PANEL
Anion gap: 6 (ref 5–15)
BUN: 11 mg/dL (ref 8–23)
CO2: 18 mmol/L — ABNORMAL LOW (ref 22–32)
Calcium: 7.6 mg/dL — ABNORMAL LOW (ref 8.9–10.3)
Chloride: 117 mmol/L — ABNORMAL HIGH (ref 98–111)
Creatinine, Ser: 0.81 mg/dL (ref 0.44–1.00)
GFR calc Af Amer: >60 mL/min (ref >60)
GFR calc non Af Amer: >60 mL/min (ref >60)
Glucose, Bld: 89 mg/dL (ref 70–99)
Potassium: 3.8 mmol/L (ref 3.5–5.1)
Sodium: 141 mmol/L (ref 135–145)

## 2019-07-16 MED ORDER — METOPROLOL TARTRATE 25 MG PO TABS: 12.5000 mg | ORAL_TABLET | Freq: Two times a day (BID) | ORAL | 1 refills | Status: AC

## 2019-07-16 MED ORDER — PANTOPRAZOLE SODIUM 40 MG PO TBEC: 40.0000 mg | DELAYED_RELEASE_TABLET | Freq: Two times a day (BID) | ORAL | 1 refills | Status: AC

## 2019-07-16 NOTE — Plan of Care (Signed)
  Problem: Education: Goal: Knowledge of General Education information will improve Description Including pain rating scale, medication(s)/side effects and non-pharmacologic comfort measures Outcome: Progressing   Problem: Health Behavior/Discharge Planning: Goal: Ability to manage health-related needs will improve Outcome: Progressing   

## 2019-07-16 NOTE — Progress Notes (Signed)
Updated son pt departure. SRP, RN

## 2019-07-16 NOTE — TOC Transition Note (Signed)
Transition of Care [facility]) - CM/SW Discharge Note   Patient Details  Name: Lynn Howell MRN: [id number] Date of Birth: Jan 22, 1925  Transition of Care [facility]) CM/SW Contact:  Dessa Phi, RN Phone Number: 07/16/2019, 11:40 AM   Clinical Narrative: Aida Puffer from [facility] RTMY#[id number].son VAPOLI [phone] ware of [facility] for home-[place] is there @ home-confirmed address.[facility] called. No further CM needs.      Final next level of care: [place] Barriers to Discharge: No Barriers Identified   Patient Goals and CMS Choice Patient states their goals for this hospitalization and ongoing recovery are:: go home CMS Medicare.gov Compare Post Acute Care list provided to:: Patient Represenative (must comment) Choice offered to / list presented to : Adult Children  Discharge Placement                Patient to be transferred to facility by: [place] Name of family member notified: Eden Lathe son [phone] Patient and family notified of of transfer: 07/16/19  Discharge Plan and Services   Discharge Planning Services: CM Consult Post Acute Care Choice: Durable Medical Equipment, Home Health (hoyer lift;HHRN/aide)          DME Arranged: Other see comment Harrel Lemon lift) DME Agency: [employer] Date DME Agency Contacted: 07/15/19 Time DME Agency Contacted: [phone] Representative spoke with at DME Agency: Thedore Mins HH Arranged: RN, Nurse's Aide [place] Agency: [facility] Date [place]: 07/15/19 Time [place]: 1516 Representative spoke with at [place]: [place] ([place]) Interventions     Readmission Risk Interventions No flowsheet data found.

## 2019-07-16 NOTE — Progress Notes (Signed)
Discharge instruction reviewed with son, acknowledged understanding. SRP, RN

## 2019-07-16 NOTE — Discharge Summary (Signed)
Physician Discharge Summary  Lynn Howell KGY:[id number] DOB: [birth year] DOA: 07/11/2019  PCP: Lynn Borg, MD  Admit date: 07/11/2019 Discharge date: 07/16/2019  Recommendations for Outpatient Follow-up:  1. Consider referral to outpatient hospice, son will pursue on his own   Follow-up Information    Lynn Borg, MD. Schedule an appointment as soon as possible for a visit in 1 week(s).   Specialties: Internal Medicine, Radiology Contact information: [place] [address] [phone]                Discharge Diagnoses: Principal diagnosis is #1 1. Vomiting, colonic ileus 2. ABLA, presumed GI bleed 3. Dysphagia unspecified 4. Episodes of unresponsiveness 5. Potential pulmonary nodules on the left versus callus formation from rib fractures. 6. Advanced dementia cared for at home 7. Liposarcoma left thigh diagnosed 2012.   Discharge Condition: improved Disposition: home with [facility] aide, RN  Diet recommendation:  Dysphagia 3 (Mech soft);Thin liquid (when md indicates appropriate)   Liquid Administration via: Cup;Straw Medication Administration: Whole meds with puree Supervision: Staff to assist with self feeding Compensations: Slow rate;Small sips/bites Postural Changes: Seated upright at 90 degrees;Remain upright for at least 30 minutes after po intake   Filed Weights   07/11/19 1031  Weight: 49.9 kg    History of present illness:  86yow PMH advanced dementia presented from homewith several episodes of unresponsiveness. Initial evaluation was unrevealing, observation requested for further evaluation.   Hospital Course:  Early in hospitalization patient developed vomiting of unclear significance.  Abdominal imaging revealed ileus.  She had no further vomiting.  Hemoglobin dropped slightly which was thought to secondary to IV fluids.  Over the next 24 hours drop significantly.  Upon further reflection, likely had a self-limited GI bleed.   In discussion with family, very conservative management desired as documented below.  Transfused 2 units PRBC with improvement in hemoglobin and stability over 72 hours.  No evidence of bleeding.  Bowels moving.  No vomiting.  Given goals of care, patient appears to receive maximum benefit from hospitalization and is stable for discharge.  Individual issues as below.  Vomiting, colonic ileus.  Has not had any vomiting since 6/24.   --No vomiting now more than  84 hours.    Previous follow-up imaging revealed resolution of ileus.  Electrolytes were corrected.  ABLA, presumed GI bleed --No evidence of bleeding, other than when admitted did appear to have hematemesis or coffee-ground emesis. --Hemoglobin did drop significantly over 48 hours, transfused PRBC 2 units 6/26 with appropriate response and stability over the last 3 days. --Upon reflection, GI bleed suspected.  Have been in close discussion with son each day, per son, plan very conservative management, no gastroenterology consultation, invasive work-up or CT imaging planned or desired per son.  Did agree to transfusion but no other evaluation.  Dysphagia unspecified Dysphagia 3 (Mech soft);Thin liquid (when md indicates appropriate)   Liquid Administration via: Cup;Straw Medication Administration: Whole meds with puree Supervision: Staff to assist with self feeding Compensations: Slow rate;Small sips/bites Postural Changes: Seated upright at 90 degrees;Remain upright for at least 30 minutes after po intake   Episodes of unresponsiveness. All occurred while sitting, difficult to assess given patient's advanced dementia. While differential is broad, would favor vasovagal type phenomenon. No new deficits, CT head negative, no evidence to suggest stroke. Although seizure is considered, this is low on the differential, will monitor on telemetry but will not pursue EEG unless a definite event is captured during  this hospitalization.  Orthostatics not testable given the patient's bedbound/wheelchair status. --No episodes of unresponsiveness during hospitalization.  Telemetry predominantly sinus rhythm, a few brief instances of narrow complex tachycardia, possibly transient atrial fibrillation versus atrial tachycardia.   --Currently in sinus rhythm, mild sinus tachycardia. Given anemia, suspected GI bleed as well as goals of care, no anticoagulation is indicated.  Continue low-dose beta-blocker. --2D echocardiogram normal LVEF, grade 1 diastolic dysfunction.  Normal right ventricular systolic function.  Potential pulmonary nodules on the left versus callus formation from rib fractures. --Given patient's advanced dementia, as well as known liposarcoma, no further evaluation suggested.  Advanced dementia cared for at home --Appears to be at baseline, plan will be to return home   Liposarcoma left thigh diagnosed 2012. --Last seen by oncology 2019 with plan for no further evaluation or treatment. Slow-growing tumor.  Today's assessment: S: No issues overnight.  Did have a bowel movement per nursing.  No vomiting.  No bleeding. O: Vitals:  Vitals:   07/15/19 2316 07/16/19 0454  BP: 136/68 (!) 119/56  Pulse: 100 64  Resp:  20  Temp:  98.6 F (37 C)  SpO2:  98%    Constitutional:  . Appears calm today.  Comfortable. Respiratory:  . CTA bilaterally, no w/r/r.  . Respiratory effort normal.  Cardiovascular:  . RRR, no m/r/g . No LE extremity edema   Abdomen:  . Soft, nontender, nondistended. Psychiatric:  . Mental status o Mood, affect unchanged.  Not assessable secondary to dementia.  Basic metabolic panel unremarkable.  Discharge Instructions  Discharge Instructions    Discharge instructions   Complete by: As directed    Call your physician or seek immediate medical attention for bleeding, pain, worsening of condition. Soft diet, chopped up. Give medications with puree. Assist with self feeding    Increase activity slowly   Complete by: As directed      Allergies as of 07/16/2019      Reactions   Alendronate Sodium Other (See Comments)   unknown   Hydrochlorothiazide W-triamterene Other (See Comments)   unknown   Norco [hydrocodone-acetaminophen] Nausea Only      Medication List    STOP taking these medications   irbesartan-hydrochlorothiazide 150-12.5 MG tablet Commonly known as: AVALIDE     TAKE these medications   amLODipine 5 MG tablet Commonly known as: NORVASC Take 1 tablet (5 mg total) by mouth daily.   metoprolol tartrate 25 MG tablet Commonly known as: LOPRESSOR Take 0.5 tablets (12.5 mg total) by mouth 2 (two) times daily.   pantoprazole 40 MG tablet Commonly known as: Protonix Take 1 tablet (40 mg total) by mouth 2 (two) times daily before a meal.   QUEtiapine 50 MG tablet Commonly known as: SEROquel Take 1 tablet (50 mg total) by mouth at bedtime.            Durable Medical Equipment  (From admission, onward)         Start     Ordered   07/15/19 1218  For home use only DME Other see comment  Once       Comments: [facility] Lemon lift  Question:  Length of Need  Answer:  Lifetime   07/15/19 1219         Allergies  Allergen Reactions  . Alendronate Sodium Other (See Comments)    unknown  . Hydrochlorothiazide W-Triamterene Other (See Comments)    unknown  . Norco [Hydrocodone-Acetaminophen] Nausea Only    The results of significant diagnostics from this  hospitalization (including imaging, microbiology, ancillary and laboratory) are listed below for reference.    Significant Diagnostic Studies: DG Chest 1 View  Result Date: 07/11/2019 CLINICAL DATA:  Altered mental status, history of dementia EXAM: CHEST  1 VIEW COMPARISON:  Chest x-ray from 11/16/2017 FINDINGS: Heart size mildly enlarged similar to previous imaging. Hilar structures are normal. Nodular opacities project over the LEFT mid chest measuring as large as 11 mm. There are signs of  rib fractures with healing over the RIGHT chest involving ribs 4 through 8 posterolaterally. Nodular areas in the LEFT chest not clearly corresponding to anterior ribs though assessment on portable radiograph is limited. No signs of lobar consolidation. No sign of pleural effusion. Nodular areas in the LEFT chest not seen on previous shoulder radiographs from 2020. There is also evidence of deformity of the LEFT clavicle with healed appearance could not present on exam of 1 year ago. IMPRESSION: 1. No signs of lobar consolidation or pleural effusion. 2. Potential pulmonary nodules versus callus formation from rib fractures in the LEFT chest in this patient who exhibit signs of trauma to the LEFT clavicle and evidence of healing rib fractures along the RIGHT posterolateral chest. Chest CT may be helpful to exclude underlying pulmonary nodule in the LEFT chest. 3. Remote RIGHT rib fractures with healing over ribs 4 through 8 posterolaterally. Electronically Signed   By: Zetta Bills M.D.   On: 07/11/2019 11:50   CT Head Wo Contrast  Result Date: 07/11/2019 CLINICAL DATA:  Altered mental status, possible seizure EXAM: CT HEAD WITHOUT CONTRAST TECHNIQUE: Contiguous axial images were obtained from the base of the skull through the vertex without intravenous contrast. COMPARISON:  07/24/2018 FINDINGS: Technical note: Examination is mildly degraded by motion artifact. Axial series was repeated with decreased motion artifact. Exam is of adequate diagnostic quality. Brain: No evidence of acute infarction, hemorrhage, hydrocephalus, extra-axial collection or mass lesion/mass effect. Extensive low-density changes within the periventricular and subcortical white matter compatible with chronic microvascular ischemic change. Moderate diffuse cerebral volume loss. Vascular: Atherosclerotic calcifications involving the large vessels of the skull base. No unexpected hyperdense vessel. Skull: Negative for acute calvarial  fracture. Sinuses/Orbits: No acute finding. Other: None. IMPRESSION: 1. No acute intracranial findings. 2. Chronic microvascular ischemic change and cerebral volume loss. Electronically Signed   By: Davina Poke D.O.   On: 07/11/2019 11:36   DG Abd Portable 1V  Result Date: 07/14/2019 CLINICAL DATA:  Ileus. EXAM: PORTABLE ABDOMEN - 1 VIEW COMPARISON:  July 12, 2019 FINDINGS: Limited views of the lung bases are normal. No free air, portal venous gas, or pneumatosis. Calcifications in the pelvis are stable. No evidence of ileus on today's study. There is a paucity of small bowel gas. No dilated loops of bowel identified. IMPRESSION: No evidence of ileus today.  No changes. Electronically Signed   By: Dorise Bullion III M.D   On: 07/14/2019 06:22   DG Abd Portable 1V  Result Date: 07/12/2019 CLINICAL DATA:  Ileus with vomiting. EXAM: PORTABLE ABDOMEN - 1 VIEW COMPARISON:  07/11/2019 FINDINGS: Diffuse gaseous distension of the colon is again noted compatible with ileus versus distal bowel obstruction. This is unchanged when compared with the previous exam. No free air. IMPRESSION: Persistent gaseous distension of the colon compatible with colonic ileus versus distal bowel obstruction. Unchanged from previous exam. Electronically Signed   By: Kerby Moors M.D.   On: 07/12/2019 11:40   DG Abd Portable 1V  Result Date: 07/11/2019 CLINICAL DATA:  Vomiting with  coffee-ground emesis EXAM: PORTABLE ABDOMEN - 1 VIEW COMPARISON:  None. FINDINGS: The pelvis is incompletely included. Sutures over the right paramedian upper abdomen. Moderate diffuse gaseous dilatation of primarily colon. IMPRESSION: Moderate diffuse gaseous dilatation of primarily colon, suggestive of colonic ileus. Low distal obstruction could be considered, note that the pelvis is not completely included on the current image. Electronically Signed   By: Donavan Foil M.D.   On: 07/11/2019 19:50   ECHOCARDIOGRAM COMPLETE  Result Date:  07/12/2019    ECHOCARDIOGRAM REPORT   Patient Name:   Lynn Howell Date of Exam: 07/12/2019 Medical Rec #:  [id number]        Height:       67.0 in Accession #:    [id number]       Weight:       110.0 lb Date of Birth:  12/30/1925       BSA:          1.569 m Patient Age:    62 years         BP:           109/95 mmHg Patient Gender: F                HR:           95 bpm. Exam Location:  Inpatient Procedure: 2D Echo, Cardiac Doppler and Color Doppler Indications:    R55 Syncope  History:        Patient has no prior history of Echocardiogram examinations.                 Signs/Symptoms:Alzheimer's, Altered Mental Status, Syncope and                 Dizziness/Lightheadedness; Risk Factors:Hypertension and                 Dyslipidemia. Subdural hematoma. Liposarcoma.  Sonographer:    Roseanna Rainbow RDCS Referring Phys: [place]  Sonographer Comments: Technically difficult study due to poor echo windows. Patient appears non- verbal and could not follow direction. Patient had left arm clutched to chest in apical region. IMPRESSIONS  1. Left ventricular ejection fraction, by estimation, is 65 to 70%. The left ventricle has hyperdynamic function. The left ventricle has no regional wall motion abnormalities. There is mild left ventricular hypertrophy. Left ventricular diastolic parameters are consistent with Grade I diastolic dysfunction (impaired relaxation).  2. Right ventricular systolic function is normal. The right ventricular size is mildly enlarged. There is mildly elevated pulmonary artery systolic pressure. The estimated right ventricular systolic pressure is 61.6 mmHg.  3. Left atrial size was mild to moderately dilated.  4. Right atrial size was moderately dilated.  5. Small pericardial effusion. The pericardial effusion is circumferential.  6. The mitral valve is degenerative. Trivial mitral valve regurgitation. No evidence of mitral stenosis.  7. The tricuspid valve is degenerative. Tricuspid valve  regurgitation is moderate.  8. The aortic valve is tricuspid. Aortic valve regurgitation is trivial. No aortic stenosis is present.  9. The inferior vena cava is normal in size with <50% respiratory variability, suggesting right atrial pressure of 8 mmHg. FINDINGS  Left Ventricle: Left ventricular ejection fraction, by estimation, is 65 to 70%. The left ventricle has hyperdynamic function. The left ventricle has no regional wall motion abnormalities. The left ventricular internal cavity size was normal in size. There is mild left ventricular hypertrophy. Left ventricular diastolic parameters are consistent with Grade I diastolic dysfunction (impaired relaxation). Right  Ventricle: The right ventricular size is mildly enlarged. No increase in right ventricular wall thickness. Right ventricular systolic function is normal. There is mildly elevated pulmonary artery systolic pressure. The tricuspid regurgitant velocity is 2.88 m/s, and with an assumed right atrial pressure of 10 mmHg, the estimated right ventricular systolic pressure is 56.9 mmHg. Left Atrium: Left atrial size was mild to moderately dilated. Right Atrium: Right atrial size was moderately dilated. Pericardium: Small pericardial effusion. A small pericardial effusion is present. The pericardial effusion is circumferential. Mitral Valve: The mitral valve is degenerative in appearance. Normal mobility of the mitral valve leaflets. Moderate mitral annular calcification. Trivial mitral valve regurgitation. No evidence of mitral valve stenosis. Tricuspid Valve: The tricuspid valve is degenerative in appearance. Tricuspid valve regurgitation is moderate . No evidence of tricuspid stenosis. Aortic Valve: The aortic valve is tricuspid. . There is mild thickening and mild calcification of the aortic valve. Aortic valve regurgitation is trivial. No aortic stenosis is present. There is mild thickening of the aortic valve. There is mild calcification of the aortic  valve. Pulmonic Valve: The pulmonic valve was not well visualized. Pulmonic valve regurgitation is trivial. No evidence of pulmonic stenosis. Aorta: The aortic root is normal in size and structure. Venous: The inferior vena cava is normal in size with less than 50% respiratory variability, suggesting right atrial pressure of 8 mmHg. IAS/Shunts: No atrial level shunt detected by color flow Doppler.  LEFT VENTRICLE PLAX 2D                Diastology LVIDd:         2.88 cm LV e' lateral:   4.57 cm/s LVIDs:         1.89 cm LV E/e' lateral: 19.0 LV PW:         1.09 cm LV e' medial:    4.68 cm/s LV IVS:        1.14 cm LV E/e' medial:  18.6 LVOT diam:     1.90 cm LV SV:         43 LV SV Index:   27 LVOT Area:     2.84 cm  RIGHT VENTRICLE             IVC RV S prime:     10.40 cm/s  IVC diam: 1.56 cm TAPSE (M-mode): 2.1 cm LEFT ATRIUM             Index      RIGHT ATRIUM          Index LA diam:        2.30 cm 1.47 cm/m RA Area:     9.25 cm LA Vol (A2C):   5.3 ml  3.37 ml/m RA Volume:   18.60 ml 11.86 ml/m LA Vol (A4C):   13.8 ml 8.80 ml/m LA Biplane Vol: 9.1 ml  5.80 ml/m  AORTIC VALVE LVOT Vmax:   85.00 cm/s LVOT Vmean:  56.100 cm/s LVOT VTI:    0.151 m  AORTA Ao Root diam: 2.90 cm MITRAL VALVE                TRICUSPID VALVE MV Area (PHT): 4.68 cm     TR Peak grad:   33.2 mmHg MV Decel Time: 162 msec     TR Vmax:        288.00 cm/s MV E velocity: 87.00 cm/s MV A velocity: 116.00 cm/s  SHUNTS MV E/A ratio:  0.75         Systemic VTI:  0.15 m                             Systemic Diam: 1.90 cm Cherlynn Kaiser MD Electronically signed by Cherlynn Kaiser MD Signature Date/Time: 07/12/2019/3:48:19 PM    Final     Microbiology: Recent Results (from the past 240 hour(s))  Urine culture     Status: None   Collection Time: 07/11/19  2:50 PM   Specimen: Urine, Clean Catch  Result Value Ref Range Status   Specimen Description   Final    URINE, CLEAN CATCH Performed at [facility], [place] [address] [place], [place] [address]    Special Requests   Final    NONE Performed at [facility], [place] [address] [place], [place] [address]    Culture   Final    NO GROWTH Performed at [facility], [place] [address] [place], [place] [address]    Report Status 07/13/2019 FINAL  Final  SARS Coronavirus 2 by RT PCR (hospital order, performed in [facility] hospital lab) Nasopharyngeal Nasopharyngeal Swab     Status: None   Collection Time: 07/11/19  3:42 PM   Specimen: Nasopharyngeal Swab  Result Value Ref Range Status   SARS Coronavirus 2 NEGATIVE NEGATIVE Final    Comment: (NOTE) SARS-CoV-2 target nucleic acids are NOT DETECTED.  The SARS-CoV-2 RNA is generally detectable in upper and lower respiratory specimens during the acute phase of infection. The lowest concentration of SARS-CoV-2 viral copies this assay can detect is 250 copies / mL. A negative result does not preclude SARS-CoV-2 infection and should not be used as the sole basis for treatment or other patient management decisions.  A negative result may occur with improper specimen collection / handling, submission of specimen other than nasopharyngeal swab, presence of viral mutation(s) within the areas targeted by this assay, and inadequate number of viral copies (<250 copies / mL). A negative result must be combined with clinical observations, patient history, and epidemiological information.  Fact Sheet for Patients:   StrictlyIdeas.no  Fact Sheet for Healthcare Providers: BankingDealers.co.za  This test is not yet approved or  cleared by the [place] FDA and has been authorized for detection and/or diagnosis of SARS-CoV-2 by FDA under an Emergency Use Authorization (EUA).  This EUA will remain in effect (meaning this test can be used) for the duration of the COVID-19 declaration under Section 564(b)(1) of the Act, 21 U.S.C. section  360bbb-3(b)(1), unless the authorization is terminated or revoked sooner.  Performed at [facility], [place] [address], [place], [place] [address]      Labs: Basic Metabolic Panel: Recent Labs  Lab 07/11/19 1203 07/11/19 2030 07/12/19 1053 07/12/19 1053 07/13/19 0520 07/13/19 1418 07/14/19 0558 07/15/19 0854 07/16/19 0527  NA   < >  --  142   < > 143 144 141 142 141  K   < >  --  3.7   < > 3.8 4.6 4.5 4.3 3.8  CL   < >  --  109   < > 114* 115* 111 115* 117*  CO2   < >  --  23   < > 23 22 21* 22 18*  GLUCOSE   < >  --  119*   < > 94 95 107* 96 89  BUN   < >  --  42*   < > 33* 26* 22 14 11  CREATININE   < >  --  1.09*   < > 1.00 0.87 0.87 0.79 0.81  CALCIUM   < >  --  8.3*   < > 8.1* 8.2* 7.9* 7.9* 7.6*  MG  --  1.9 1.7  --  2.4  --  2.1 1.9  --   PHOS  --   --  2.5  --  2.1*  --  2.7 2.3*  --    < > = values in this interval not displayed.   Liver Function Tests: Recent Labs  Lab 07/11/19 1203  AST 32  ALT 20  ALKPHOS 76  BILITOT 0.6  PROT 6.5  ALBUMIN 3.4*   CBC: Recent Labs  Lab 07/11/19 1203 07/11/19 2030 07/13/19 0520 07/13/19 0907 07/13/19 2255 07/14/19 0759 07/15/19 0854  WBC 7.6   < > 9.6 9.1 11.5* 10.2 11.3*  NEUTROABS 6.1  --   --   --   --   --   --   HGB 9.2*   < > 6.2* 6.4* 10.7* 10.2* 10.8*  HCT 30.0*   < > 19.8* 20.5* 31.7* 30.8* 33.4*  MCV 92.9   < > 91.7 91.5 88.3 87.7 90.0  PLT 286   < > 202 219 228 215 199   < > = values in this interval not displayed.    Principal Problem:   Ileus ([place]) Active Problems:   Liposarcoma of lower extremity (HCC)   Dementia (HCC)   Syncope   Vomiting   Acute blood loss anemia   Time coordinating discharge: 35 minutes  Signed:  Murray Hodgkins, MD  [facility]  07/16/2019, 11:19 AM

## 2019-07-16 NOTE — TOC Transition Note (Signed)
Transition of Care [facility]) - CM/SW Discharge Note   Patient Details  Name: Lynn Howell MRN: [id number] Date of Birth: 1925/03/14  Transition of Care [facility]) CM/SW Contact:  Dessa Phi, RN Phone Number: 07/16/2019, 11:22 AM   Clinical Narrative:Awaiting puth from [facility] for PTAR to transport home. Pablo Pena set up w/[facility];dme-hoyer lift will be managed by [employer] rep Zach aware. MD updated.      Final next level of care: Home w Home Health Services Barriers to Discharge: No Barriers Identified, Other (comment) (Awaiting PTAR auth from HTA.)   Patient Goals and CMS Choice Patient states their goals for this hospitalization and ongoing recovery are:: go home CMS Medicare.gov Compare Post Acute Care list provided to:: Patient Represenative (must comment) Choice offered to / list presented to : Adult Children  Discharge Placement                       Discharge Plan and Services   Discharge Planning Services: CM Consult Post Acute Care Choice: Durable Medical Equipment, Home Health (hoyer lift;HHRN/aide)          DME Arranged: Other see comment [facility] lift) DME Agency: [employer] Date DME Agency Contacted: 07/15/19 Time DME Agency Contacted: [phone] Representative spoke with at DME Agency: Thedore Mins HH Arranged: RN, Nurse's Aide [place] Agency: [facility] Date [place]: 07/15/19 Time [place]: 1516 Representative spoke with at [place]: [place] ([place]) Interventions     Readmission Risk Interventions No flowsheet data found.

## 2019-07-18 ENCOUNTER — Telehealth: Payer: Self-pay | Admitting: Internal Medicine

## 2019-07-18 ENCOUNTER — Telehealth: Payer: Self-pay

## 2019-07-18 DIAGNOSIS — D509 Iron deficiency anemia, unspecified: Secondary | ICD-10-CM

## 2019-07-18 DIAGNOSIS — F0390 Unspecified dementia without behavioral disturbance: Secondary | ICD-10-CM

## 2019-07-18 NOTE — Telephone Encounter (Signed)
[facility] home health is calling requesting verbal orders for Hospice care and if Dr.John will follow the patient.  [phone] - Amy Okay to LVM

## 2019-07-18 NOTE — Telephone Encounter (Signed)
Transition Care Management Follow-up Telephone Call   Date discharged? 07/16/19   How have you been since you were released from the hospital? She has been eating, had bowel movement & been urinating. Home health nurse has been to the home & made recommendation to hospice.   Do you understand why you were in the hospital? yes   Do you understand the discharge instructions? yes   Where were you discharged to? Home   Items Reviewed:  Medications reviewed: yes  Allergies reviewed: yes  Dietary changes reviewed: yes  Referrals reviewed: yes   Functional Questionnaire:   Activities of Daily Living (ADLs):   She states they are independent in the following: Pt not independent at all States they require assistance with the following: ambulation, bathing and hygiene, feeding, continence, grooming, toileting and dressing   Any transportation issues/concerns?: yes, has no way of moving/transporting patient when needed. Lift is needed; they are discussing this with insurance   Any patient concerns? no   Confirmed importance and date/time of follow-up visits scheduled yes, is concerned about how to get the patient to the doctor's office b/c there is no way to transport her here.    Will f/u with PCP to discuss son's concerns of transporting mom to MD visit & report back to him.  Confirmed with patient if condition begins to worsen call PCP or go to the ER.  Patient was given the office number and encouraged to call back with question or concerns.  : yes

## 2019-07-18 NOTE — Telephone Encounter (Signed)
F/u  Son Lynn Howell returning a call to the office

## 2019-07-19 NOTE — Telephone Encounter (Signed)
Contacted hospice to give verbal - [facility] stated that their services were declined and that the pt and family decided to go with another service.

## 2019-07-19 NOTE — Telephone Encounter (Signed)
[facility] for hospice, ok for me to be attending, but ALSO ok for hospice MD to hand symptomatic meds

## 2020-03-08 IMAGING — DX DG HIP (WITH OR WITHOUT PELVIS) 3-4V BILAT
5 series · 5 of 5 positions shown · non-contrast
Comparison: No recent.

CLINICAL DATA: Fall.  Right hip and back pain.

EXAM:
DG HIP (WITH OR WITHOUT PELVIS) 3-4V BILAT

[pelvis ap]
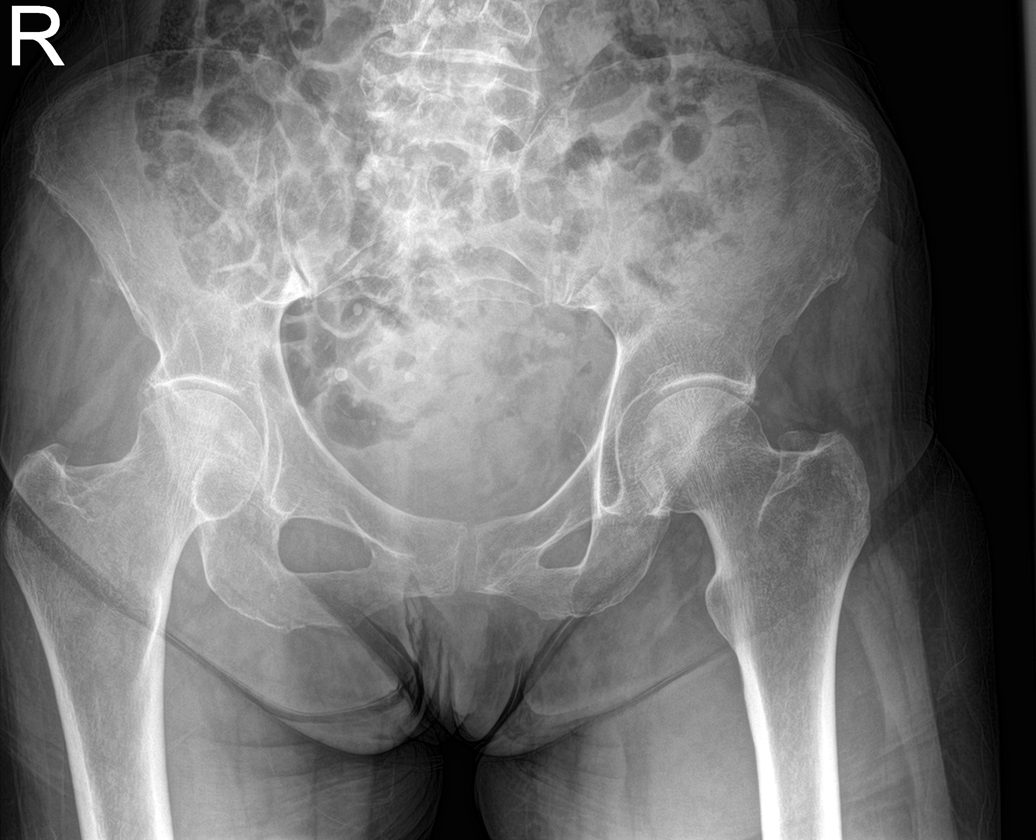

[hip ap (1 of 2)]
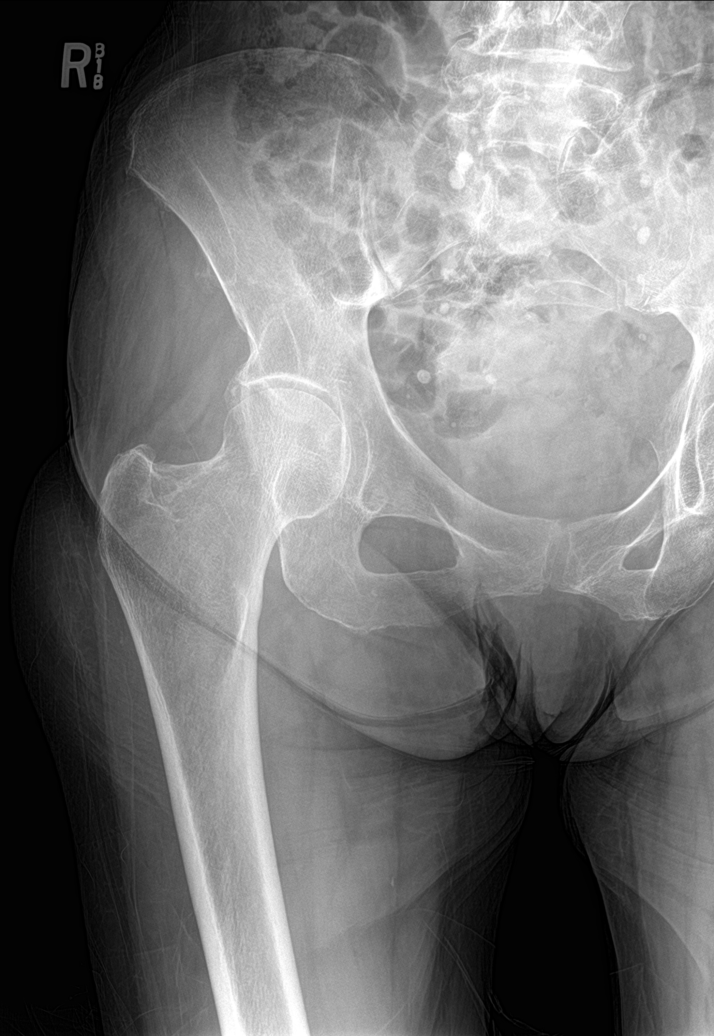

[hip lat (1 of 2)]
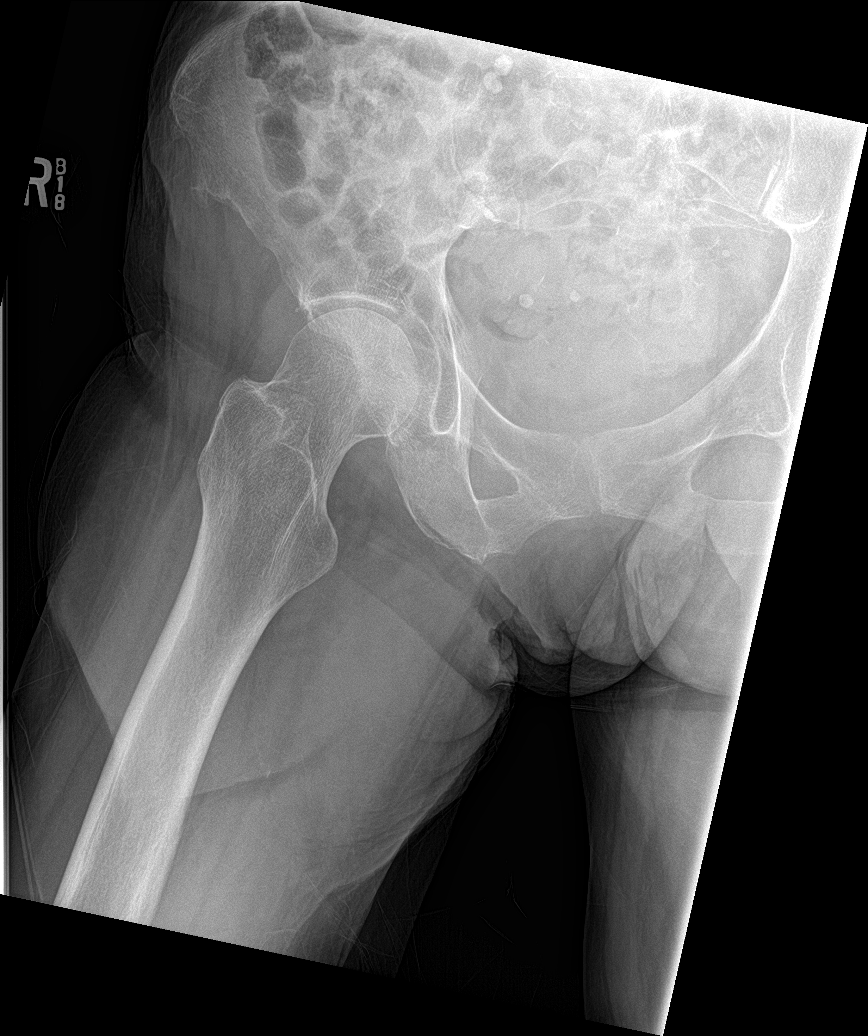

[hip ap (2 of 2)]
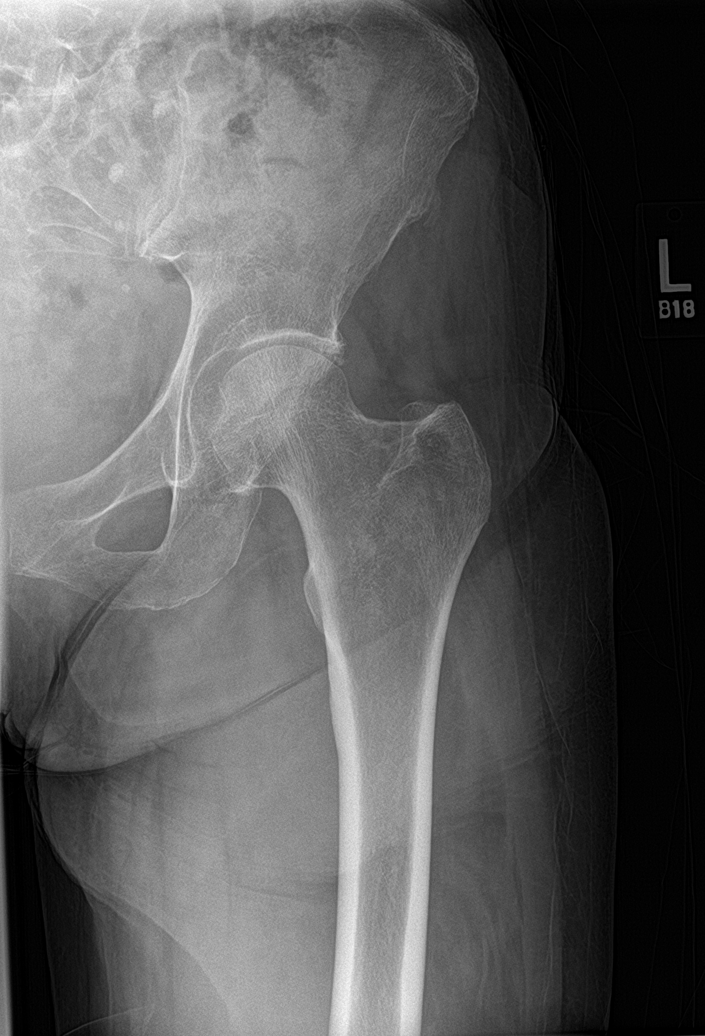

[hip lat (2 of 2)]
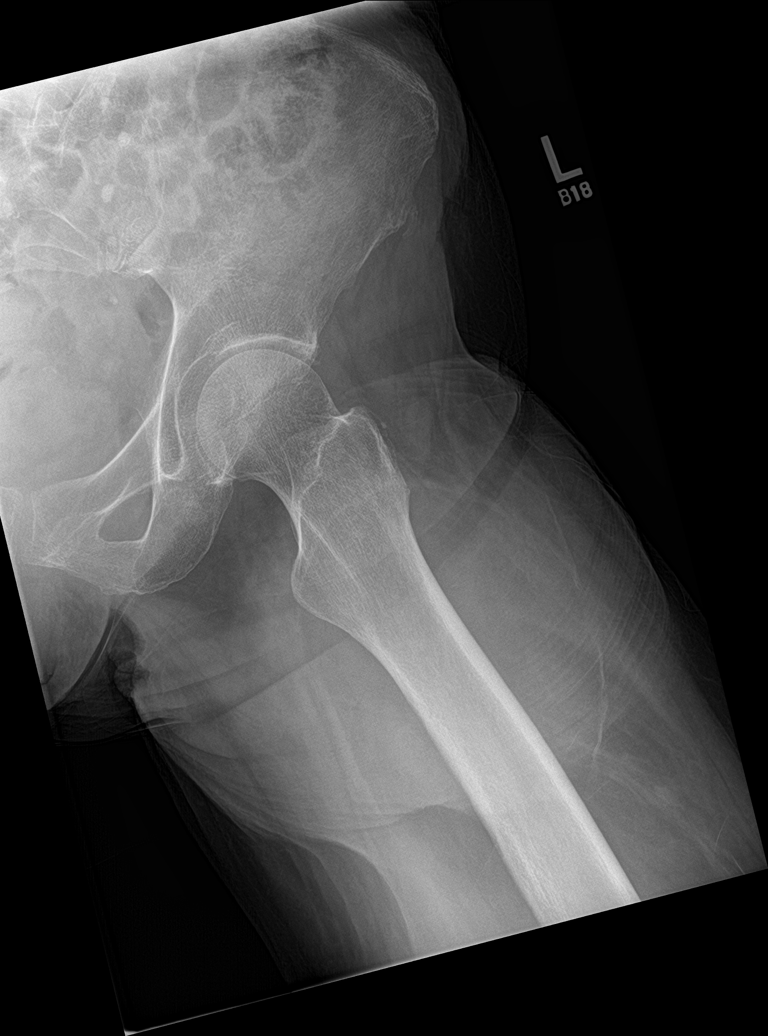

[5 of 5 positions shown; findings below may reference images not displayed]

FINDINGS: No acute bony or joint abnormality identified. No evidence of
fracture or dislocation. Degenerative changes lumbar spine and both
hips.
IMPRESSION: Degenerative changes lumbar spine and both hips. No acute
abnormality.

## 2020-03-08 IMAGING — DX DG LUMBAR SPINE COMPLETE 4+V
5 series · 5 of 5 positions shown · non-contrast
Comparison: No recent prior.

CLINICAL DATA: Fall.  Hip and back pain.

EXAM:
LUMBAR SPINE - COMPLETE 4+ VIEW

[l-spine ap]
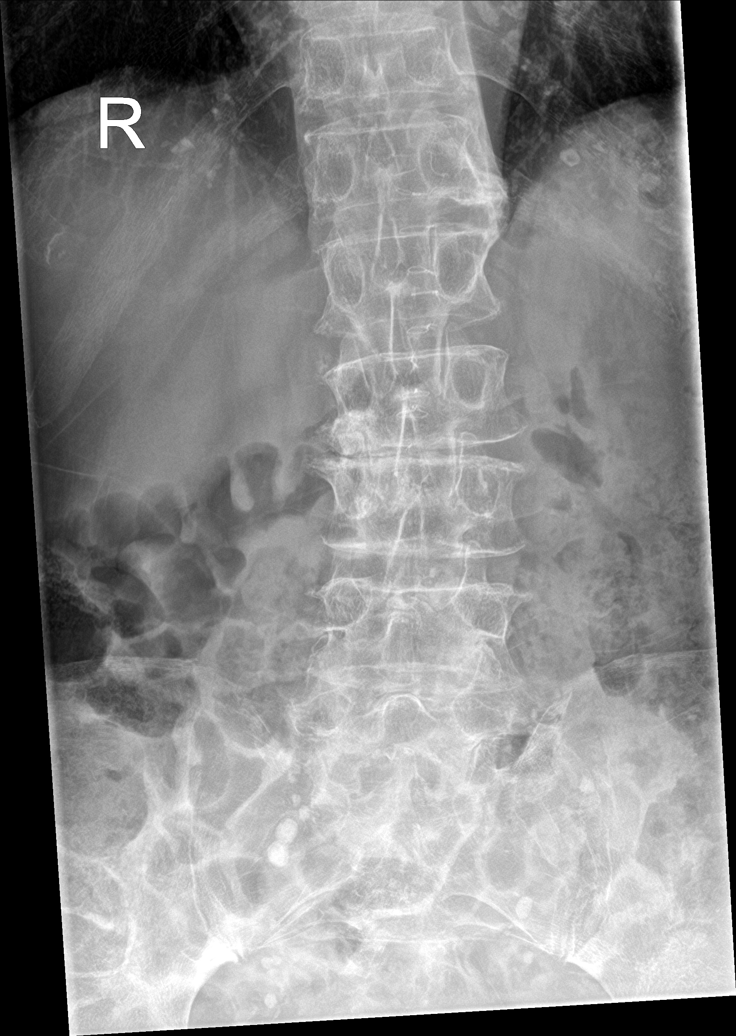

[l-spine obl (1 of 2)]
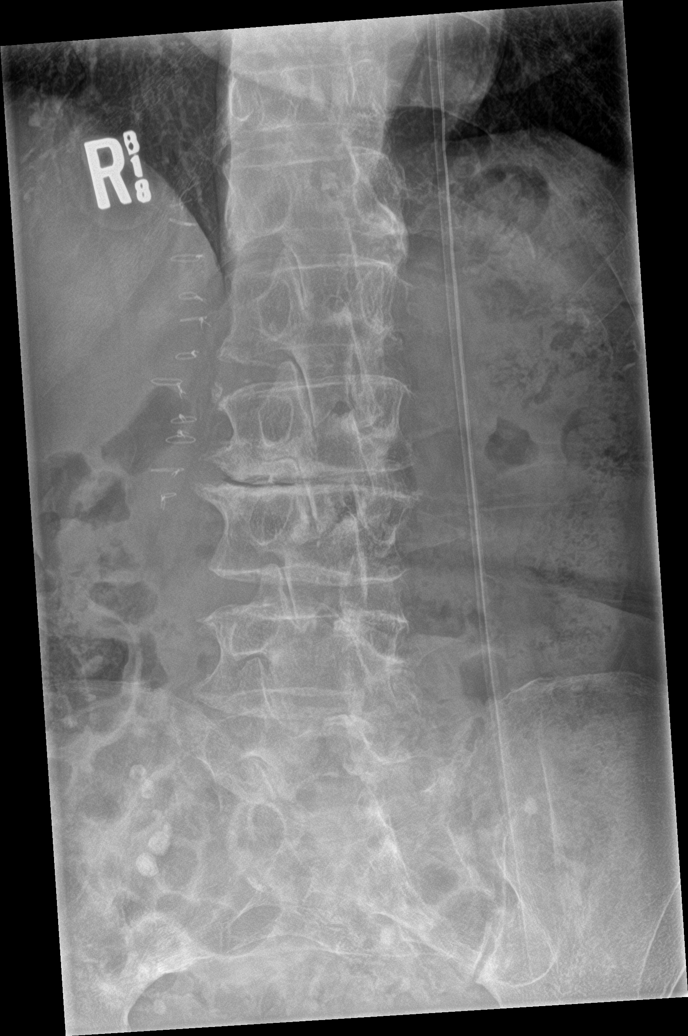

[l-spine obl (2 of 2)]
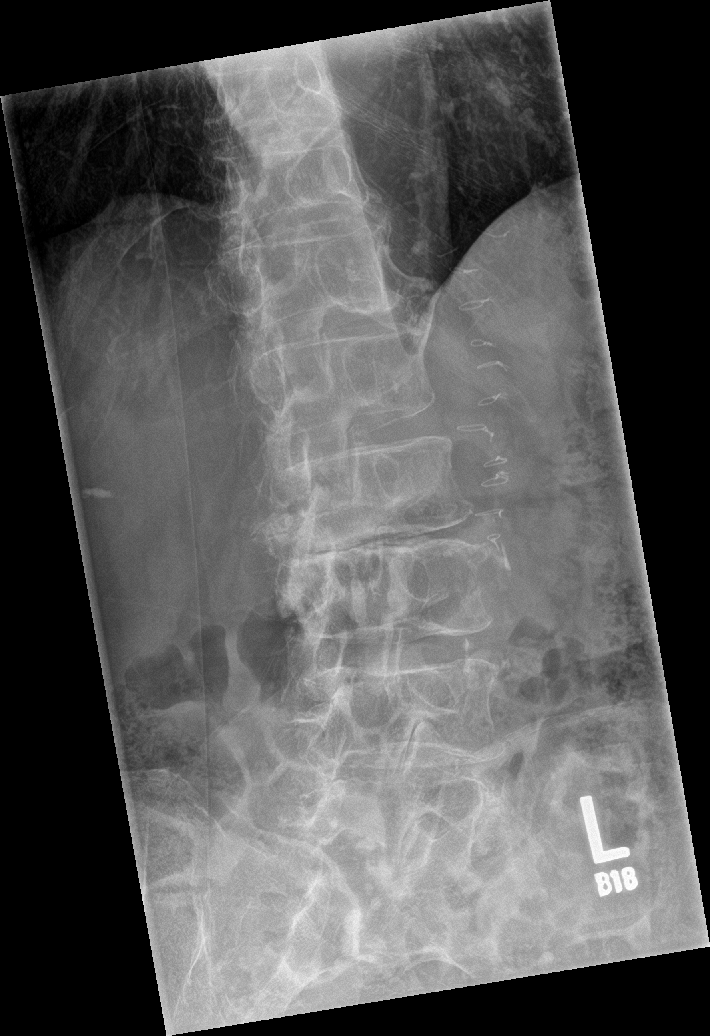

[l-spine lat]
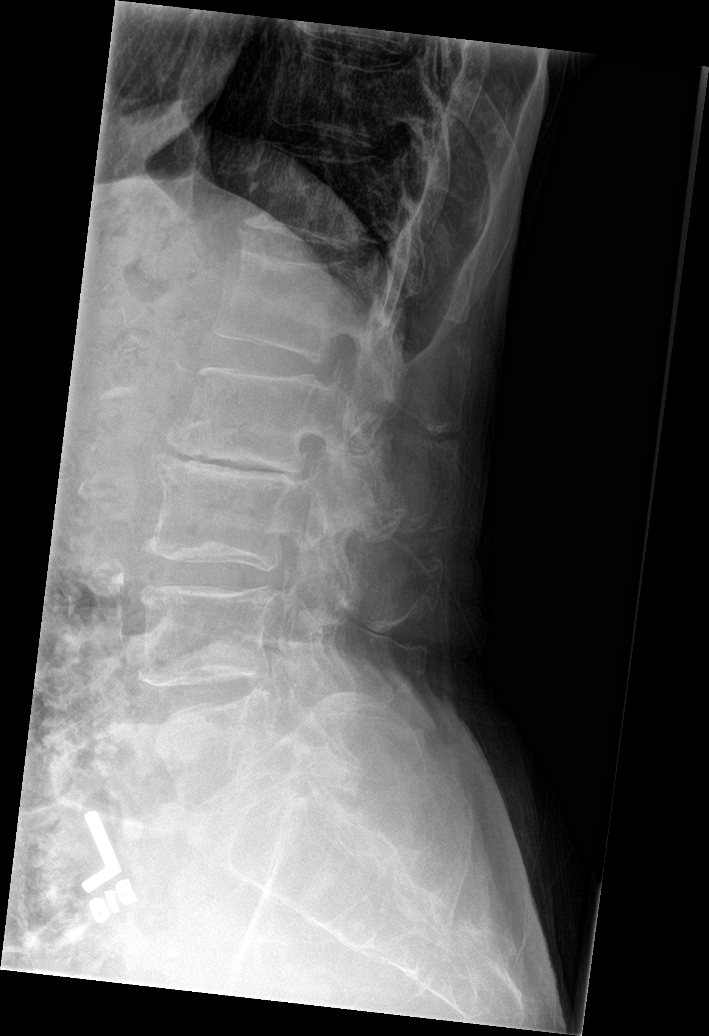

[l-spine spot]
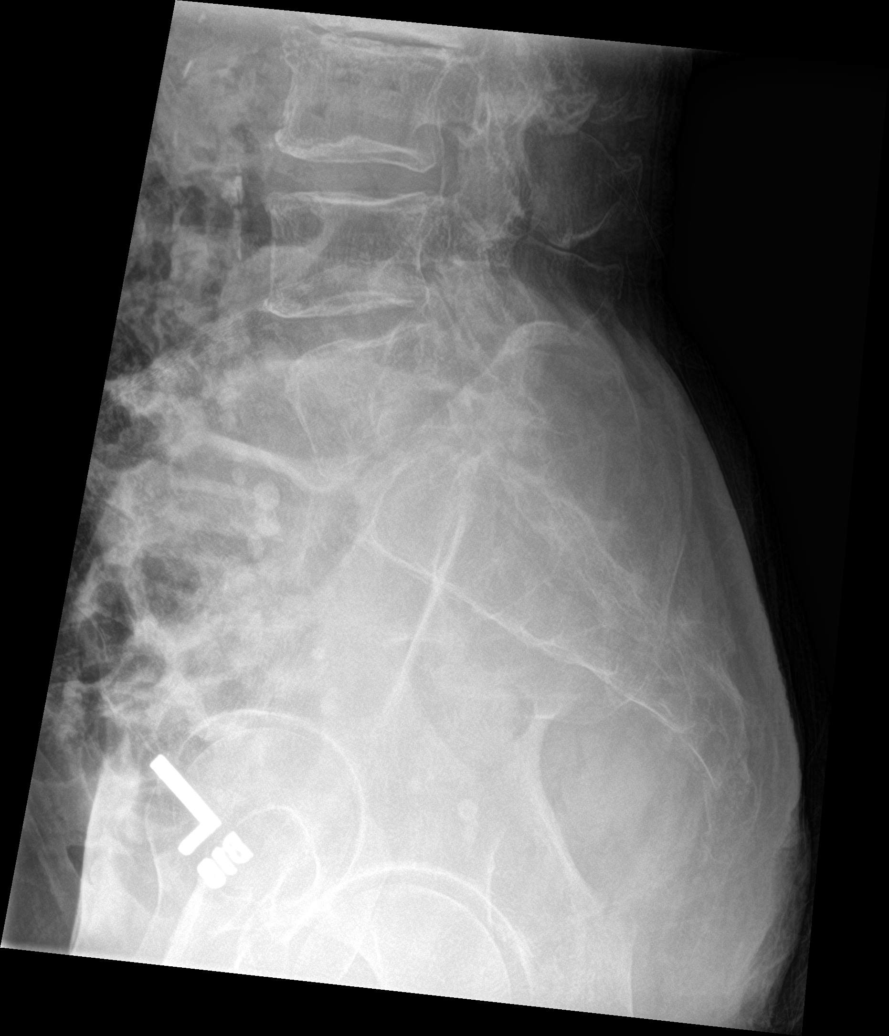

[5 of 5 positions shown; findings below may reference images not displayed]

FINDINGS: Lumbar spine numbered the lowest segmented appearing lumbar shaped
vertebrae on lateral view as L5. Scoliosis lumbar spine concave
right. Diffuse multilevel degenerative change particularly prominent
at L2-L3 which level there is prominent disc space loss. Slight
displaced fracture of the coccyx cannot be excluded. Aortoiliac
atherosclerotic vascular calcification. Calcified pelvic densities
consistent phleboliths.
IMPRESSION: 1.  Slightly displaced fracture of the coccyx cannot be excluded.

2.  Lumbar scoliosis and degenerative change.

3.  Aortoiliac atherosclerotic vascular disease.
